# Patient Record
Sex: Male | Born: 1940 | Race: White | Hispanic: No | Marital: Married | Smoking: Former smoker
Health system: Southern US, Community
[De-identification: ages and names within clinical notes are randomized; demographics above are authoritative.]

## PROBLEM LIST (undated history)

## (undated) DIAGNOSIS — E039 Hypothyroidism, unspecified: Secondary | ICD-10-CM

## (undated) DIAGNOSIS — E785 Hyperlipidemia, unspecified: Secondary | ICD-10-CM

## (undated) DIAGNOSIS — D649 Anemia, unspecified: Secondary | ICD-10-CM

## (undated) DIAGNOSIS — H409 Unspecified glaucoma: Secondary | ICD-10-CM

## (undated) DIAGNOSIS — I2510 Atherosclerotic heart disease of native coronary artery without angina pectoris: Secondary | ICD-10-CM

## (undated) DIAGNOSIS — F1011 Alcohol abuse, in remission: Secondary | ICD-10-CM

## (undated) DIAGNOSIS — J449 Chronic obstructive pulmonary disease, unspecified: Secondary | ICD-10-CM

## (undated) DIAGNOSIS — Z8619 Personal history of other infectious and parasitic diseases: Secondary | ICD-10-CM

## (undated) DIAGNOSIS — I10 Essential (primary) hypertension: Secondary | ICD-10-CM

## (undated) HISTORY — PX: EYE SURGERY: SHX253

## (undated) HISTORY — PX: CORONARY ARTERY BYPASS GRAFT: SHX141

## (undated) HISTORY — PX: CORONARY ANGIOPLASTY: SHX604

## (undated) SURGERY — LAPAROSCOPIC CHOLECYSTECTOMY WITH INTRAOPERATIVE CHOLANGIOGRAM
Anesthesia: General

---

## 2004-07-24 ENCOUNTER — Ambulatory Visit: Payer: Self-pay | Admitting: Internal Medicine

## 2008-12-11 ENCOUNTER — Inpatient Hospital Stay: Payer: Self-pay | Admitting: Internal Medicine

## 2012-04-01 ENCOUNTER — Ambulatory Visit: Payer: Self-pay | Admitting: Ophthalmology

## 2012-04-22 DIAGNOSIS — D649 Anemia, unspecified: Secondary | ICD-10-CM | POA: Insufficient documentation

## 2012-04-29 ENCOUNTER — Ambulatory Visit: Payer: Self-pay | Admitting: Ophthalmology

## 2012-12-08 ENCOUNTER — Ambulatory Visit: Payer: Self-pay | Admitting: Internal Medicine

## 2013-01-02 ENCOUNTER — Ambulatory Visit: Payer: Self-pay | Admitting: Internal Medicine

## 2013-07-05 ENCOUNTER — Ambulatory Visit: Payer: Self-pay | Admitting: Internal Medicine

## 2013-07-05 LAB — CREATININE, SERUM
Creatinine: 1.29 mg/dL (ref 0.60–1.30)
EGFR (African American): >60
EGFR (Non-African Amer.): 55 — ABNORMAL LOW

## 2013-07-05 LAB — CBC CANCER CENTER
Basophil #: 0.1 x10 3/mm (ref 0.0–0.1)
Basophil %: 0.9 %
EOS ABS: 0.6 x10 3/mm (ref 0.0–0.7)
Eosinophil %: 6.5 %
HCT: 34.5 % — ABNORMAL LOW (ref 40.0–52.0)
HGB: 12 g/dL — ABNORMAL LOW (ref 13.0–18.0)
Lymphocyte #: 1.8 x10 3/mm (ref 1.0–3.6)
Lymphocyte %: 20.4 %
MCH: 31.1 pg (ref 26.0–34.0)
MCHC: 34.8 g/dL (ref 32.0–36.0)
MCV: 90 fL (ref 80–100)
MONOS PCT: 8.5 %
Monocyte #: 0.8 x10 3/mm (ref 0.2–1.0)
Neutrophil #: 5.7 x10 3/mm (ref 1.4–6.5)
Neutrophil %: 63.7 %
Platelet: 232 x10 3/mm (ref 150–440)
RBC: 3.85 10*6/uL — AB (ref 4.40–5.90)
RDW: 13.3 % (ref 11.5–14.5)
WBC: 8.9 x10 3/mm (ref 3.8–10.6)

## 2013-07-05 LAB — CALCIUM: Calcium, Total: 9.2 mg/dL (ref 8.5–10.1)

## 2013-07-06 LAB — PROT IMMUNOELECTROPHORES(ARMC)

## 2013-07-06 LAB — KAPPA/LAMBDA FREE LIGHT CHAINS (ARMC)

## 2013-08-02 ENCOUNTER — Ambulatory Visit: Payer: Self-pay | Admitting: Internal Medicine

## 2014-08-09 ENCOUNTER — Ambulatory Visit: Payer: Self-pay | Admitting: Internal Medicine

## 2014-08-09 ENCOUNTER — Other Ambulatory Visit: Payer: Self-pay

## 2014-08-22 ENCOUNTER — Encounter: Admission: RE | Payer: Self-pay | Source: Ambulatory Visit

## 2014-08-22 ENCOUNTER — Ambulatory Visit: Admission: RE | Admit: 2014-08-22 | Payer: PPO | Source: Ambulatory Visit | Admitting: Gastroenterology

## 2014-08-22 SURGERY — COLONOSCOPY WITH PROPOFOL
Anesthesia: General

## 2014-09-22 ENCOUNTER — Encounter: Payer: Self-pay | Admitting: *Deleted

## 2014-09-23 ENCOUNTER — Encounter: Payer: Self-pay | Admitting: Certified Registered Nurse Anesthetist

## 2014-09-23 ENCOUNTER — Encounter
Admission: RE | Disposition: A | Discharge status: Home / Self Care | Payer: Self-pay | Source: Ambulatory Visit | Attending: Gastroenterology

## 2014-09-23 ENCOUNTER — Ambulatory Visit
Admission: RE | Admit: 2014-09-23 | Discharge: 2014-09-23 | Disposition: A | Discharge status: Home / Self Care | Payer: PPO | Source: Ambulatory Visit | Attending: Gastroenterology | Admitting: Gastroenterology

## 2014-09-23 DIAGNOSIS — H409 Unspecified glaucoma: Secondary | ICD-10-CM | POA: Diagnosis not present

## 2014-09-23 DIAGNOSIS — Z9861 Coronary angioplasty status: Secondary | ICD-10-CM | POA: Diagnosis not present

## 2014-09-23 DIAGNOSIS — Z1211 Encounter for screening for malignant neoplasm of colon: Secondary | ICD-10-CM | POA: Diagnosis present

## 2014-09-23 DIAGNOSIS — I2510 Atherosclerotic heart disease of native coronary artery without angina pectoris: Secondary | ICD-10-CM | POA: Diagnosis not present

## 2014-09-23 DIAGNOSIS — E785 Hyperlipidemia, unspecified: Secondary | ICD-10-CM | POA: Insufficient documentation

## 2014-09-23 DIAGNOSIS — Z87891 Personal history of nicotine dependence: Secondary | ICD-10-CM | POA: Insufficient documentation

## 2014-09-23 DIAGNOSIS — K5730 Diverticulosis of large intestine without perforation or abscess without bleeding: Secondary | ICD-10-CM | POA: Diagnosis not present

## 2014-09-23 DIAGNOSIS — I10 Essential (primary) hypertension: Secondary | ICD-10-CM | POA: Diagnosis not present

## 2014-09-23 DIAGNOSIS — Z7982 Long term (current) use of aspirin: Secondary | ICD-10-CM | POA: Insufficient documentation

## 2014-09-23 DIAGNOSIS — Z79899 Other long term (current) drug therapy: Secondary | ICD-10-CM | POA: Diagnosis not present

## 2014-09-23 DIAGNOSIS — Z951 Presence of aortocoronary bypass graft: Secondary | ICD-10-CM | POA: Diagnosis not present

## 2014-09-23 HISTORY — DX: Hyperlipidemia, unspecified: E78.5

## 2014-09-23 HISTORY — DX: Alcohol abuse, in remission: F10.11

## 2014-09-23 HISTORY — PX: COLONOSCOPY WITH PROPOFOL: SHX5780

## 2014-09-23 HISTORY — DX: Unspecified glaucoma: H40.9

## 2014-09-23 HISTORY — DX: Anemia, unspecified: D64.9

## 2014-09-23 HISTORY — DX: Personal history of other infectious and parasitic diseases: Z86.19

## 2014-09-23 HISTORY — DX: Essential (primary) hypertension: I10

## 2014-09-23 HISTORY — DX: Atherosclerotic heart disease of native coronary artery without angina pectoris: I25.10

## 2014-09-23 SURGERY — COLONOSCOPY WITH PROPOFOL
Anesthesia: General

## 2014-09-23 MED ORDER — FENTANYL CITRATE (PF) 100 MCG/2ML IJ SOLN
INTRAMUSCULAR | Status: AC
  Filled 2014-09-23: qty 4

## 2014-09-23 MED ORDER — FENTANYL CITRATE (PF) 100 MCG/2ML IJ SOLN
INTRAMUSCULAR | Status: DC | PRN
  Administered 2014-09-23 (×2): 25 ug via INTRAVENOUS
  Administered 2014-09-23: 50 ug via INTRAVENOUS

## 2014-09-23 MED ORDER — MIDAZOLAM HCL 5 MG/5ML IJ SOLN
INTRAMUSCULAR | Status: DC | PRN
  Administered 2014-09-23: 2 mg via INTRAVENOUS
  Administered 2014-09-23 (×2): 1 mg via INTRAVENOUS

## 2014-09-23 MED ORDER — SODIUM CHLORIDE 0.9 % IV SOLN: INTRAVENOUS | Status: DC

## 2014-09-23 MED ORDER — MIDAZOLAM HCL 5 MG/5ML IJ SOLN
INTRAMUSCULAR | Status: AC
  Filled 2014-09-23: qty 10

## 2014-09-23 MED ORDER — SODIUM CHLORIDE 0.9 % IV SOLN
INTRAVENOUS | Status: DC
  Administered 2014-09-23: 1000 mL via INTRAVENOUS

## 2014-09-23 NOTE — H&P (Signed)
Primary Care Physician:  Idelle Crouch, MD  Pre-Procedure History & Physical: HPI:  Jason House is a 74 y.o. male is here for an colonoscopy.   Past Medical History  Diagnosis Date  . Hyperlipidemia   . Hypertension   . Coronary artery disease   . Glaucoma   . H/O ETOH abuse   . History of shingles   . Anemia     Past Surgical History  Procedure Laterality Date  . Coronary artery bypass graft    . Eye surgery    . Coronary angioplasty      Prior to Admission medications   Medication Sig Start Date End Date Taking? Authorizing Provider  amLODipine (NORVASC) 10 MG tablet Take 10 mg by mouth daily.   Yes Historical Provider, MD  aspirin EC 81 MG tablet Take 81 mg by mouth daily.   Yes Historical Provider, MD  atorvastatin (LIPITOR) 80 MG tablet Take 80 mg by mouth daily.   Yes Historical Provider, MD  clopidogrel (PLAVIX) 75 MG tablet Take 75 mg by mouth daily.   Yes Historical Provider, MD  colchicine 0.6 MG tablet Take 0.6 mg by mouth as directed. Take 2 tablets by mouth at first sign of gout, followed by one tablet in one hour   Yes Historical Provider, MD  dorzolamide-timolol (COSOPT) 22.3-6.8 MG/ML ophthalmic solution Place 1 drop into both eyes 2 (two) times daily.   Yes Historical Provider, MD  hydrochlorothiazide (MICROZIDE) 12.5 MG capsule Take 12.5 mg by mouth daily.   Yes Historical Provider, MD  latanoprost (XALATAN) 0.005 % ophthalmic solution Place 1 drop into both eyes at bedtime.   Yes Historical Provider, MD  lisinopril (PRINIVIL,ZESTRIL) 40 MG tablet Take 40 mg by mouth daily.   Yes Historical Provider, MD  metoprolol succinate (TOPROL-XL) 25 MG 24 hr tablet Take 25 mg by mouth daily.   Yes Historical Provider, MD  vitamin B-12 (CYANOCOBALAMIN) 1000 MCG tablet Take 1,000 mcg by mouth daily.   Yes Historical Provider, MD    Allergies as of 08/17/2014  . (Not on File)    History reviewed. No pertinent family history.  History   Social History  .  Marital Status: Married    Spouse Name: N/A  . Number of Children: N/A  . Years of Education: N/A   Occupational History  . Not on file.   Social History Main Topics  . Smoking status: Former Research scientist (life sciences)  . Smokeless tobacco: Not on file  . Alcohol Use: Not on file  . Drug Use: Not on file  . Sexual Activity: Not on file   Other Topics Concern  . Not on file   Social History Narrative  . No narrative on file     Physical Exam: BP 114/48 mmHg  Pulse 47  Temp(Src) 97.1 F (36.2 C) (Tympanic)  Resp 16  Ht 5\' 4"  (1.626 m)  Wt 75.751 kg (167 lb)  BMI 28.65 kg/m2  SpO2 100% General:   Alert,  pleasant and cooperative in NAD Head:  Normocephalic and atraumatic. Neck:  Supple; no masses or thyromegaly. Lungs:  Clear throughout to auscultation.    Heart:  Regular rate and rhythm. Abdomen:  Soft, nontender and nondistended. Normal bowel sounds, without guarding, and without rebound.   Neurologic:  Alert and  oriented x4;  grossly normal neurologically.  Impression/Plan: Jason House is here for an colonoscopy to be performed for screening  Risks, benefits, limitations, and alternatives regarding  colonoscopy have been reviewed with the patient.  Questions have been answered.  All parties agreeable.   Josefine Class, MD  09/23/2014, 8:48 AM

## 2014-09-23 NOTE — Discharge Instructions (Signed)

## 2014-09-23 NOTE — Op Note (Signed)
[facility] Gastroenterology Patient Name: Jason House Procedure Date: 09/23/2014 8:56 AM MRN: [id number] Account #: [id number] Date of Birth: 05/30/1940 Admit Type: Outpatient Age: 74 Room: [facility] ENDO ROOM 3 Gender: Male Note Status: Finalized Procedure:         Colonoscopy Indications:       Screening for colorectal malignant neoplasm, Last                     colonoscopy 10 years ago Patient Profile:   This is a 74 year old male. Providers:         Gerrit Heck. Rayann Heman, MD Referring MD:      Leonie Douglas. Sparks, MD (Referring MD) Medicines:         Fentanyl 100 micrograms IV, Midazolam 4 mg IV Complications:     No immediate complications. Procedure:         Pre-Anesthesia Assessment:                    - Prior to the procedure, a History and Physical was                     performed, and patient medications, allergies and                     sensitivities were reviewed. The patient's tolerance of                     previous anesthesia was reviewed.                    After obtaining informed consent, the colonoscope was                     passed under direct vision. Throughout the procedure, the                     patient's blood pressure, pulse, and oxygen saturations                     were monitored continuously. The Colonoscope was                     introduced through the anus and advanced to the the cecum,                     identified by appendiceal orifice and ileocecal valve. The                     colonoscopy was somewhat difficult due to a tortuous                     colon. Successful completion of the procedure was aided by                     using manual pressure. The patient tolerated the procedure                     well. The quality of the bowel preparation was excellent. Findings:      The perianal and digital rectal examinations were normal.      A few small and large-mouthed diverticula were found in the sigmoid       colon, in the  transverse colon, at the hepatic flexure and in the       ascending colon.  The exam was otherwise without abnormality on direct and retroflexion       views. Impression:        - Diverticulosis in the sigmoid colon, in the transverse                     colon, at the hepatic flexure and in the ascending colon.                    - The examination was otherwise normal on direct and                     retroflexion views.                    - No specimens collected. Recommendation:    - Observe patient in GI recovery unit.                    - High fiber diet.                    - Continue present medications.                    - Resume Plavix (clopidogrel) at prior dose today.                    - Would not perform further colonoscpies for screening                     given age.                    - Follow Hgb and iron studies as outpt, will need EGD if                     hgb falls further and iron studies become consistent with                     IDA                    - The findings and recommendations were discussed with the                     patient.                    - The findings and recommendations were discussed with the                     patient's family.                    - Return to referring physician. Procedure Code(s): --- Professional ---                    [phone], Colonoscopy, flexible; diagnostic, including                     collection of specimen(s) by brushing or washing, when                     performed (separate procedure) CPT copyright 2014 American Medical Association. All rights reserved. The codes documented in this report are preliminary and upon coder review may  be revised to meet current compliance requirements. Mellody Life, MD 09/23/2014 9:29:15 AM This report has been signed electronically. Number of Addenda:  0 Note Initiated On: 09/23/2014 8:56 AM Scope Withdrawal Time: 0 hours 11 minutes 36 seconds  Total Procedure Duration: 0  hours 18 minutes 22 seconds       [facility]

## 2014-09-26 ENCOUNTER — Encounter: Payer: Self-pay | Admitting: Gastroenterology

## 2014-12-06 ENCOUNTER — Other Ambulatory Visit: Payer: Self-pay | Admitting: Internal Medicine

## 2014-12-06 DIAGNOSIS — H539 Unspecified visual disturbance: Secondary | ICD-10-CM

## 2014-12-06 DIAGNOSIS — G44319 Acute post-traumatic headache, not intractable: Secondary | ICD-10-CM

## 2014-12-12 ENCOUNTER — Ambulatory Visit
Admission: RE | Admit: 2014-12-12 | Discharge: 2014-12-12 | Disposition: A | Discharge status: Home / Self Care | Payer: PPO | Source: Ambulatory Visit | Attending: Internal Medicine | Admitting: Internal Medicine

## 2014-12-12 DIAGNOSIS — H539 Unspecified visual disturbance: Secondary | ICD-10-CM | POA: Insufficient documentation

## 2014-12-12 DIAGNOSIS — G44319 Acute post-traumatic headache, not intractable: Secondary | ICD-10-CM | POA: Diagnosis present

## 2015-03-15 DIAGNOSIS — H401121 Primary open-angle glaucoma, left eye, mild stage: Secondary | ICD-10-CM | POA: Diagnosis not present

## 2015-03-20 DIAGNOSIS — H401121 Primary open-angle glaucoma, left eye, mild stage: Secondary | ICD-10-CM | POA: Diagnosis not present

## 2015-06-01 DIAGNOSIS — D649 Anemia, unspecified: Secondary | ICD-10-CM | POA: Diagnosis not present

## 2015-06-01 DIAGNOSIS — Z79899 Other long term (current) drug therapy: Secondary | ICD-10-CM | POA: Diagnosis not present

## 2015-06-01 DIAGNOSIS — E782 Mixed hyperlipidemia: Secondary | ICD-10-CM | POA: Diagnosis not present

## 2015-06-01 DIAGNOSIS — Z125 Encounter for screening for malignant neoplasm of prostate: Secondary | ICD-10-CM | POA: Diagnosis not present

## 2015-06-01 DIAGNOSIS — H538 Other visual disturbances: Secondary | ICD-10-CM | POA: Diagnosis not present

## 2015-06-01 DIAGNOSIS — Z1329 Encounter for screening for other suspected endocrine disorder: Secondary | ICD-10-CM | POA: Diagnosis not present

## 2015-06-01 DIAGNOSIS — I10 Essential (primary) hypertension: Secondary | ICD-10-CM | POA: Diagnosis not present

## 2015-06-01 DIAGNOSIS — E538 Deficiency of other specified B group vitamins: Secondary | ICD-10-CM | POA: Diagnosis not present

## 2015-06-01 DIAGNOSIS — M62830 Muscle spasm of back: Secondary | ICD-10-CM | POA: Diagnosis not present

## 2015-06-01 DIAGNOSIS — Z0000 Encounter for general adult medical examination without abnormal findings: Secondary | ICD-10-CM | POA: Diagnosis not present

## 2015-06-01 DIAGNOSIS — I739 Peripheral vascular disease, unspecified: Secondary | ICD-10-CM | POA: Diagnosis not present

## 2015-06-01 DIAGNOSIS — R0600 Dyspnea, unspecified: Secondary | ICD-10-CM | POA: Diagnosis not present

## 2015-06-01 DIAGNOSIS — R0609 Other forms of dyspnea: Secondary | ICD-10-CM | POA: Diagnosis not present

## 2015-07-20 DIAGNOSIS — I340 Nonrheumatic mitral (valve) insufficiency: Secondary | ICD-10-CM | POA: Diagnosis not present

## 2015-07-20 DIAGNOSIS — I7389 Other specified peripheral vascular diseases: Secondary | ICD-10-CM | POA: Diagnosis not present

## 2015-07-20 DIAGNOSIS — E782 Mixed hyperlipidemia: Secondary | ICD-10-CM | POA: Diagnosis not present

## 2015-07-20 DIAGNOSIS — I2510 Atherosclerotic heart disease of native coronary artery without angina pectoris: Secondary | ICD-10-CM | POA: Diagnosis not present

## 2015-07-20 DIAGNOSIS — I10 Essential (primary) hypertension: Secondary | ICD-10-CM | POA: Diagnosis not present

## 2015-07-20 DIAGNOSIS — Z951 Presence of aortocoronary bypass graft: Secondary | ICD-10-CM | POA: Diagnosis not present

## 2015-09-18 DIAGNOSIS — E039 Hypothyroidism, unspecified: Secondary | ICD-10-CM | POA: Diagnosis not present

## 2015-09-18 DIAGNOSIS — H401121 Primary open-angle glaucoma, left eye, mild stage: Secondary | ICD-10-CM | POA: Diagnosis not present

## 2015-12-06 DIAGNOSIS — R7309 Other abnormal glucose: Secondary | ICD-10-CM | POA: Diagnosis not present

## 2015-12-06 DIAGNOSIS — I2510 Atherosclerotic heart disease of native coronary artery without angina pectoris: Secondary | ICD-10-CM | POA: Diagnosis not present

## 2015-12-06 DIAGNOSIS — I10 Essential (primary) hypertension: Secondary | ICD-10-CM | POA: Diagnosis not present

## 2015-12-06 DIAGNOSIS — E039 Hypothyroidism, unspecified: Secondary | ICD-10-CM | POA: Diagnosis not present

## 2015-12-06 DIAGNOSIS — Z79899 Other long term (current) drug therapy: Secondary | ICD-10-CM | POA: Diagnosis not present

## 2015-12-06 DIAGNOSIS — E782 Mixed hyperlipidemia: Secondary | ICD-10-CM | POA: Diagnosis not present

## 2015-12-06 DIAGNOSIS — R0602 Shortness of breath: Secondary | ICD-10-CM | POA: Diagnosis not present

## 2015-12-06 DIAGNOSIS — J069 Acute upper respiratory infection, unspecified: Secondary | ICD-10-CM | POA: Diagnosis not present

## 2015-12-06 DIAGNOSIS — Z23 Encounter for immunization: Secondary | ICD-10-CM | POA: Diagnosis not present

## 2015-12-21 DIAGNOSIS — R7309 Other abnormal glucose: Secondary | ICD-10-CM | POA: Diagnosis not present

## 2015-12-21 DIAGNOSIS — I2510 Atherosclerotic heart disease of native coronary artery without angina pectoris: Secondary | ICD-10-CM | POA: Diagnosis not present

## 2015-12-21 DIAGNOSIS — I7389 Other specified peripheral vascular diseases: Secondary | ICD-10-CM | POA: Diagnosis not present

## 2015-12-21 DIAGNOSIS — E782 Mixed hyperlipidemia: Secondary | ICD-10-CM | POA: Diagnosis not present

## 2015-12-21 DIAGNOSIS — E039 Hypothyroidism, unspecified: Secondary | ICD-10-CM | POA: Diagnosis not present

## 2015-12-21 DIAGNOSIS — Z79899 Other long term (current) drug therapy: Secondary | ICD-10-CM | POA: Diagnosis not present

## 2015-12-21 DIAGNOSIS — I10 Essential (primary) hypertension: Secondary | ICD-10-CM | POA: Diagnosis not present

## 2016-03-18 DIAGNOSIS — H401121 Primary open-angle glaucoma, left eye, mild stage: Secondary | ICD-10-CM | POA: Diagnosis not present

## 2016-03-25 DIAGNOSIS — H401121 Primary open-angle glaucoma, left eye, mild stage: Secondary | ICD-10-CM | POA: Diagnosis not present

## 2016-04-23 DIAGNOSIS — I10 Essential (primary) hypertension: Secondary | ICD-10-CM | POA: Diagnosis not present

## 2016-04-23 DIAGNOSIS — I25810 Atherosclerosis of coronary artery bypass graft(s) without angina pectoris: Secondary | ICD-10-CM | POA: Diagnosis not present

## 2016-04-23 DIAGNOSIS — E782 Mixed hyperlipidemia: Secondary | ICD-10-CM | POA: Diagnosis not present

## 2016-04-23 DIAGNOSIS — I2510 Atherosclerotic heart disease of native coronary artery without angina pectoris: Secondary | ICD-10-CM | POA: Diagnosis not present

## 2016-06-13 ENCOUNTER — Other Ambulatory Visit: Payer: Self-pay | Admitting: Internal Medicine

## 2016-06-13 DIAGNOSIS — R7309 Other abnormal glucose: Secondary | ICD-10-CM | POA: Diagnosis not present

## 2016-06-13 DIAGNOSIS — E782 Mixed hyperlipidemia: Secondary | ICD-10-CM | POA: Diagnosis not present

## 2016-06-13 DIAGNOSIS — Z0000 Encounter for general adult medical examination without abnormal findings: Secondary | ICD-10-CM | POA: Diagnosis not present

## 2016-06-13 DIAGNOSIS — R0602 Shortness of breath: Secondary | ICD-10-CM | POA: Diagnosis not present

## 2016-06-13 DIAGNOSIS — Z125 Encounter for screening for malignant neoplasm of prostate: Secondary | ICD-10-CM | POA: Diagnosis not present

## 2016-06-13 DIAGNOSIS — Z79899 Other long term (current) drug therapy: Secondary | ICD-10-CM | POA: Diagnosis not present

## 2016-06-13 DIAGNOSIS — E039 Hypothyroidism, unspecified: Secondary | ICD-10-CM | POA: Diagnosis not present

## 2016-06-13 DIAGNOSIS — I10 Essential (primary) hypertension: Secondary | ICD-10-CM | POA: Diagnosis not present

## 2016-06-13 DIAGNOSIS — I739 Peripheral vascular disease, unspecified: Secondary | ICD-10-CM | POA: Diagnosis not present

## 2016-06-13 DIAGNOSIS — R634 Abnormal weight loss: Secondary | ICD-10-CM

## 2016-06-13 DIAGNOSIS — I2510 Atherosclerotic heart disease of native coronary artery without angina pectoris: Secondary | ICD-10-CM | POA: Diagnosis not present

## 2016-06-20 DIAGNOSIS — N179 Acute kidney failure, unspecified: Secondary | ICD-10-CM | POA: Diagnosis not present

## 2016-06-28 ENCOUNTER — Ambulatory Visit
Admission: RE | Admit: 2016-06-28 | Discharge: 2016-06-28 | Disposition: A | Discharge status: Home / Self Care | Payer: PPO | Source: Ambulatory Visit | Attending: Internal Medicine | Admitting: Internal Medicine

## 2016-06-28 DIAGNOSIS — R634 Abnormal weight loss: Secondary | ICD-10-CM | POA: Insufficient documentation

## 2016-06-28 DIAGNOSIS — I700 Atherosclerosis of aorta: Secondary | ICD-10-CM | POA: Insufficient documentation

## 2016-06-28 DIAGNOSIS — N400 Enlarged prostate without lower urinary tract symptoms: Secondary | ICD-10-CM | POA: Insufficient documentation

## 2016-06-28 MED ORDER — IOPAMIDOL (ISOVUE-300) INJECTION 61%
85.0000 mL | Freq: Once | INTRAVENOUS | Status: AC | PRN
  Administered 2016-06-28: 85 mL via INTRAVENOUS

## 2016-07-22 DIAGNOSIS — R600 Localized edema: Secondary | ICD-10-CM | POA: Diagnosis not present

## 2016-07-22 DIAGNOSIS — I10 Essential (primary) hypertension: Secondary | ICD-10-CM | POA: Diagnosis not present

## 2016-09-13 DIAGNOSIS — Z79899 Other long term (current) drug therapy: Secondary | ICD-10-CM | POA: Diagnosis not present

## 2016-09-13 DIAGNOSIS — I739 Peripheral vascular disease, unspecified: Secondary | ICD-10-CM | POA: Diagnosis not present

## 2016-09-13 DIAGNOSIS — M542 Cervicalgia: Secondary | ICD-10-CM | POA: Diagnosis not present

## 2016-09-13 DIAGNOSIS — E782 Mixed hyperlipidemia: Secondary | ICD-10-CM | POA: Diagnosis not present

## 2016-09-13 DIAGNOSIS — I10 Essential (primary) hypertension: Secondary | ICD-10-CM | POA: Diagnosis not present

## 2016-09-13 DIAGNOSIS — J431 Panlobular emphysema: Secondary | ICD-10-CM | POA: Diagnosis not present

## 2016-09-13 DIAGNOSIS — M50323 Other cervical disc degeneration at C6-C7 level: Secondary | ICD-10-CM | POA: Diagnosis not present

## 2016-09-23 DIAGNOSIS — H401121 Primary open-angle glaucoma, left eye, mild stage: Secondary | ICD-10-CM | POA: Diagnosis not present

## 2016-09-24 DIAGNOSIS — I2510 Atherosclerotic heart disease of native coronary artery without angina pectoris: Secondary | ICD-10-CM | POA: Diagnosis not present

## 2016-09-24 DIAGNOSIS — I25810 Atherosclerosis of coronary artery bypass graft(s) without angina pectoris: Secondary | ICD-10-CM | POA: Diagnosis not present

## 2016-09-24 DIAGNOSIS — I10 Essential (primary) hypertension: Secondary | ICD-10-CM | POA: Diagnosis not present

## 2016-09-24 DIAGNOSIS — E782 Mixed hyperlipidemia: Secondary | ICD-10-CM | POA: Diagnosis not present

## 2016-10-01 DIAGNOSIS — R079 Chest pain, unspecified: Secondary | ICD-10-CM | POA: Diagnosis not present

## 2016-10-04 DIAGNOSIS — I340 Nonrheumatic mitral (valve) insufficiency: Secondary | ICD-10-CM | POA: Diagnosis not present

## 2016-10-04 DIAGNOSIS — E782 Mixed hyperlipidemia: Secondary | ICD-10-CM | POA: Diagnosis not present

## 2016-10-04 DIAGNOSIS — I7389 Other specified peripheral vascular diseases: Secondary | ICD-10-CM | POA: Diagnosis not present

## 2016-10-04 DIAGNOSIS — R0602 Shortness of breath: Secondary | ICD-10-CM | POA: Diagnosis not present

## 2016-10-04 DIAGNOSIS — I2510 Atherosclerotic heart disease of native coronary artery without angina pectoris: Secondary | ICD-10-CM | POA: Diagnosis not present

## 2016-10-04 DIAGNOSIS — I10 Essential (primary) hypertension: Secondary | ICD-10-CM | POA: Diagnosis not present

## 2016-10-04 DIAGNOSIS — I25810 Atherosclerosis of coronary artery bypass graft(s) without angina pectoris: Secondary | ICD-10-CM | POA: Diagnosis not present

## 2016-12-18 DIAGNOSIS — I10 Essential (primary) hypertension: Secondary | ICD-10-CM | POA: Diagnosis not present

## 2016-12-18 DIAGNOSIS — E782 Mixed hyperlipidemia: Secondary | ICD-10-CM | POA: Diagnosis not present

## 2016-12-18 DIAGNOSIS — I2510 Atherosclerotic heart disease of native coronary artery without angina pectoris: Secondary | ICD-10-CM | POA: Diagnosis not present

## 2016-12-18 DIAGNOSIS — Z79899 Other long term (current) drug therapy: Secondary | ICD-10-CM | POA: Diagnosis not present

## 2016-12-18 DIAGNOSIS — R0989 Other specified symptoms and signs involving the circulatory and respiratory systems: Secondary | ICD-10-CM | POA: Diagnosis not present

## 2016-12-18 DIAGNOSIS — Z23 Encounter for immunization: Secondary | ICD-10-CM | POA: Diagnosis not present

## 2016-12-18 DIAGNOSIS — R7309 Other abnormal glucose: Secondary | ICD-10-CM | POA: Diagnosis not present

## 2016-12-18 DIAGNOSIS — I739 Peripheral vascular disease, unspecified: Secondary | ICD-10-CM | POA: Diagnosis not present

## 2017-01-03 DIAGNOSIS — R0989 Other specified symptoms and signs involving the circulatory and respiratory systems: Secondary | ICD-10-CM | POA: Diagnosis not present

## 2017-01-03 DIAGNOSIS — I6523 Occlusion and stenosis of bilateral carotid arteries: Secondary | ICD-10-CM | POA: Diagnosis not present

## 2017-01-28 ENCOUNTER — Encounter (INDEPENDENT_AMBULATORY_CARE_PROVIDER_SITE_OTHER): Payer: Self-pay | Admitting: Vascular Surgery

## 2017-01-28 ENCOUNTER — Ambulatory Visit (INDEPENDENT_AMBULATORY_CARE_PROVIDER_SITE_OTHER): Payer: PPO | Admitting: Vascular Surgery

## 2017-01-28 ENCOUNTER — Encounter (INDEPENDENT_AMBULATORY_CARE_PROVIDER_SITE_OTHER): Payer: Self-pay

## 2017-01-28 VITALS — BP 127/51 | HR 55 | Resp 17 | Ht 66.0 in | Wt 160.0 lb

## 2017-01-28 DIAGNOSIS — E785 Hyperlipidemia, unspecified: Secondary | ICD-10-CM | POA: Diagnosis not present

## 2017-01-28 DIAGNOSIS — I6523 Occlusion and stenosis of bilateral carotid arteries: Secondary | ICD-10-CM | POA: Diagnosis not present

## 2017-01-28 DIAGNOSIS — I10 Essential (primary) hypertension: Secondary | ICD-10-CM

## 2017-01-28 NOTE — Progress Notes (Signed)
Subjective:    Patient ID: Jason House, male    DOB: [birth year], 76 y.o.   MRN: [id number] Chief Complaint  Patient presents with  . New Patient (Initial Visit)    CAS PT LS 2015   Presents as a new patient referred by Dr. Doy Hutching for evaluation of "carotid stenosis".  Patient endorses history of neck pain and headaches which prompted a duplex of his carotid arteries.  The carotid duplex was performed at [facility] radiology on January 03, 2017.  Carotid duplex was notable for greater than 70% luminal narrowing to the right internal carotid artery.  50-69% narrowing of the left internal carotid artery.  Antegrade vertebral arteries. The patient denies experiencing Amaurosis Fugax, TIA like symptoms or focal motor deficits.  Patient denies any fever, nausea vomiting.   Review of Systems  Constitutional: Negative.   HENT: Negative.   Eyes: Negative.   Respiratory: Negative.   Cardiovascular: Negative.   Gastrointestinal: Negative.   Endocrine: Negative.   Genitourinary: Negative.   Musculoskeletal: Negative.   Skin: Negative.   Allergic/Immunologic: Negative.   Neurological: Negative.   Hematological: Negative.   Psychiatric/Behavioral: Negative.       Objective:   Physical Exam  Constitutional: He is oriented to person, place, and time. He appears well-developed and well-nourished. No distress.  HENT:  Head: Normocephalic and atraumatic.  Eyes: Conjunctivae are normal. Pupils are equal, round, and reactive to light.  Neck: Normal range of motion.  Bilateral carotid bruits noted on exam.  Right greater than left.  Cardiovascular: Normal rate, regular rhythm, normal heart sounds and intact distal pulses.  Pulses:      Radial pulses are 2+ on the right side, and 2+ on the left side.  Pulmonary/Chest: Effort normal and breath sounds normal.  Musculoskeletal: Normal range of motion. He exhibits no edema.  Neurological: He is alert and oriented to person, place, and time.    Skin: Skin is warm and dry. He is not diaphoretic.  Psychiatric: He has a normal mood and affect. His behavior is normal. Judgment and thought content normal.  Vitals reviewed.  BP (!) 127/51   Pulse (!) 55   Resp 17   Ht 5\' 6"  (1.676 m)   Wt 160 lb (72.6 kg)   BMI 25.82 kg/m   Past Medical History:  Diagnosis Date  . Anemia   . Coronary artery disease   . Glaucoma   . H/O ETOH abuse   . History of shingles   . Hyperlipidemia   . Hypertension    Social History   Socioeconomic History  . Marital status: Married    Spouse name: Not on file  . Number of children: Not on file  . Years of education: Not on file  . Highest education level: Not on file  Social Needs  . Financial resource strain: Not on file  . Food insecurity - worry: Not on file  . Food insecurity - inability: Not on file  . Transportation needs - medical: Not on file  . Transportation needs - non-medical: Not on file  Occupational History  . Not on file  Tobacco Use  . Smoking status: Former Research scientist (life sciences)  . Smokeless tobacco: Never Used  Substance and Sexual Activity  . Alcohol use: Not on file  . Drug use: Not on file  . Sexual activity: Not on file  Other Topics Concern  . Not on file  Social History Narrative  . Not on file   Past Surgical History:  Procedure Laterality Date  . COLONOSCOPY WITH PROPOFOL N/A 09/23/2014   Procedure: COLONOSCOPY WITH PROPOFOL;  Surgeon: Josefine Class, MD;  Location: [facility] ENDOSCOPY;  Service: Endoscopy;  Laterality: N/A;  . CORONARY ANGIOPLASTY    . CORONARY ARTERY BYPASS GRAFT    . EYE SURGERY     No family history on file.  No Known Allergies     Assessment & Plan:  Presents as a new patient referred by Dr. Doy Hutching for evaluation of "carotid stenosis".  Patient endorses history of neck pain and headaches which prompted a duplex of his carotid arteries.  The carotid duplex was performed at [facility] radiology on January 03, 2017.  Carotid duplex was notable  for greater than 70% luminal narrowing to the right internal carotid artery.  50-69% narrowing of the left internal carotid artery.  Antegrade vertebral arteries. The patient denies experiencing Amaurosis Fugax, TIA like symptoms or focal motor deficits.  Patient denies any fever, nausea vomiting.  1. Bilateral carotid artery stenosis - New Patient with bilateral carotid artery stenosis found on duplex. Patient is asymptomatic at this time. Physical exam with bilateral carotid bruits noted Recommend follow-up in 6 months with a repeat carotid duplex. I have discussed with the patient at length the risk factors for and pathogenesis of atherosclerotic disease and encouraged a healthy diet, regular exercise regimen and blood pressure / glucose control.  Patient was instructed to contact our office in the interim with problems such as arm / leg weakness or numbness, speech / swallowing difficulty or temporary monocular blindness. The patient expresses their understanding.   - VAS US CAROTID; Future  2. Hyperlipidemia, unspecified hyperlipidemia type - Stable Encouraged good control as its slows the progression of atherosclerotic disease  3. Essential hypertension - Stable Encouraged good control as its slows the progression of atherosclerotic disease  Current Outpatient Medications on File Prior to Visit  Medication Sig Dispense Refill  . amLODipine (NORVASC) 10 MG tablet Take 10 mg by mouth daily.    Marland Kitchen aspirin EC 81 MG tablet Take 81 mg by mouth daily.    Marland Kitchen atorvastatin (LIPITOR) 80 MG tablet Take 80 mg by mouth daily.    . clopidogrel (PLAVIX) 75 MG tablet Take 75 mg by mouth daily.    . cyclobenzaprine (FLEXERIL) 10 MG tablet TAKE 1 TABLET BY MOUTH NIGHTLY    . dorzolamide-timolol (COSOPT) 22.3-6.8 MG/ML ophthalmic solution Place 1 drop into both eyes 2 (two) times daily.    . hydrochlorothiazide (MICROZIDE) 12.5 MG capsule Take 25 mg by mouth daily.     Marland Kitchen latanoprost (XALATAN) 0.005 %  ophthalmic solution Place 1 drop into both eyes at bedtime.    Marland Kitchen lisinopril (PRINIVIL,ZESTRIL) 40 MG tablet Take 40 mg by mouth daily.    . metoprolol succinate (TOPROL-XL) 25 MG 24 hr tablet Take 25 mg by mouth daily.    . metoprolol tartrate (LOPRESSOR) 25 MG tablet     . vitamin B-12 (CYANOCOBALAMIN) 1000 MCG tablet Take 1,000 mcg by mouth daily.    . budesonide-formoterol (SYMBICORT) 160-4.5 MCG/ACT inhaler Inhale into the lungs.    . colchicine 0.6 MG tablet Take 0.6 mg by mouth as directed. Take 2 tablets by mouth at first sign of gout, followed by one tablet in one hour    . nitroGLYCERIN (NITROSTAT) 0.4 MG SL tablet Place under the tongue.    . vitamin B-12 (CYANOCOBALAMIN) 1000 MCG tablet Take by mouth.     No current facility-administered medications on file prior to  visit.    There are no Patient Instructions on file for this visit. No Follow-up on file.  KIMBERLY A STEGMAYER, PA-C

## 2017-01-31 DIAGNOSIS — I10 Essential (primary) hypertension: Secondary | ICD-10-CM | POA: Diagnosis not present

## 2017-01-31 DIAGNOSIS — I25810 Atherosclerosis of coronary artery bypass graft(s) without angina pectoris: Secondary | ICD-10-CM | POA: Diagnosis not present

## 2017-01-31 DIAGNOSIS — R0602 Shortness of breath: Secondary | ICD-10-CM | POA: Diagnosis not present

## 2017-01-31 DIAGNOSIS — I340 Nonrheumatic mitral (valve) insufficiency: Secondary | ICD-10-CM | POA: Diagnosis not present

## 2017-01-31 DIAGNOSIS — E782 Mixed hyperlipidemia: Secondary | ICD-10-CM | POA: Diagnosis not present

## 2017-01-31 DIAGNOSIS — R079 Chest pain, unspecified: Secondary | ICD-10-CM | POA: Diagnosis not present

## 2017-01-31 DIAGNOSIS — I2510 Atherosclerotic heart disease of native coronary artery without angina pectoris: Secondary | ICD-10-CM | POA: Diagnosis not present

## 2017-01-31 DIAGNOSIS — I7389 Other specified peripheral vascular diseases: Secondary | ICD-10-CM | POA: Diagnosis not present

## 2017-02-03 DIAGNOSIS — R072 Precordial pain: Secondary | ICD-10-CM | POA: Diagnosis not present

## 2017-02-05 ENCOUNTER — Encounter (INDEPENDENT_AMBULATORY_CARE_PROVIDER_SITE_OTHER): Payer: Self-pay

## 2017-02-07 DIAGNOSIS — R079 Chest pain, unspecified: Secondary | ICD-10-CM | POA: Diagnosis not present

## 2017-02-07 DIAGNOSIS — E782 Mixed hyperlipidemia: Secondary | ICD-10-CM | POA: Diagnosis not present

## 2017-02-07 DIAGNOSIS — I7389 Other specified peripheral vascular diseases: Secondary | ICD-10-CM | POA: Diagnosis not present

## 2017-02-07 DIAGNOSIS — I340 Nonrheumatic mitral (valve) insufficiency: Secondary | ICD-10-CM | POA: Diagnosis not present

## 2017-02-07 DIAGNOSIS — I25810 Atherosclerosis of coronary artery bypass graft(s) without angina pectoris: Secondary | ICD-10-CM | POA: Diagnosis not present

## 2017-02-07 DIAGNOSIS — I10 Essential (primary) hypertension: Secondary | ICD-10-CM | POA: Diagnosis not present

## 2017-02-07 DIAGNOSIS — I2510 Atherosclerotic heart disease of native coronary artery without angina pectoris: Secondary | ICD-10-CM | POA: Diagnosis not present

## 2017-02-07 DIAGNOSIS — R0602 Shortness of breath: Secondary | ICD-10-CM | POA: Diagnosis not present

## 2017-03-17 DIAGNOSIS — H401121 Primary open-angle glaucoma, left eye, mild stage: Secondary | ICD-10-CM | POA: Diagnosis not present

## 2017-03-19 DIAGNOSIS — D649 Anemia, unspecified: Secondary | ICD-10-CM | POA: Diagnosis not present

## 2017-03-19 DIAGNOSIS — I739 Peripheral vascular disease, unspecified: Secondary | ICD-10-CM | POA: Diagnosis not present

## 2017-03-19 DIAGNOSIS — R7309 Other abnormal glucose: Secondary | ICD-10-CM | POA: Diagnosis not present

## 2017-03-19 DIAGNOSIS — Z125 Encounter for screening for malignant neoplasm of prostate: Secondary | ICD-10-CM | POA: Diagnosis not present

## 2017-03-19 DIAGNOSIS — I2510 Atherosclerotic heart disease of native coronary artery without angina pectoris: Secondary | ICD-10-CM | POA: Diagnosis not present

## 2017-03-19 DIAGNOSIS — Z79899 Other long term (current) drug therapy: Secondary | ICD-10-CM | POA: Diagnosis not present

## 2017-03-19 DIAGNOSIS — E782 Mixed hyperlipidemia: Secondary | ICD-10-CM | POA: Diagnosis not present

## 2017-03-19 DIAGNOSIS — I10 Essential (primary) hypertension: Secondary | ICD-10-CM | POA: Diagnosis not present

## 2017-03-24 DIAGNOSIS — H401121 Primary open-angle glaucoma, left eye, mild stage: Secondary | ICD-10-CM | POA: Diagnosis not present

## 2017-03-25 ENCOUNTER — Emergency Department: Payer: PPO

## 2017-03-25 ENCOUNTER — Encounter: Payer: Self-pay | Admitting: *Deleted

## 2017-03-25 ENCOUNTER — Other Ambulatory Visit: Payer: Self-pay

## 2017-03-25 ENCOUNTER — Emergency Department
Admission: EM | Admit: 2017-03-25 | Discharge: 2017-03-25 | Disposition: A | Discharge status: Home / Self Care | Payer: PPO | Source: Home / Self Care | Attending: Emergency Medicine | Admitting: Emergency Medicine

## 2017-03-25 DIAGNOSIS — K851 Biliary acute pancreatitis without necrosis or infection: Secondary | ICD-10-CM | POA: Diagnosis present

## 2017-03-25 DIAGNOSIS — I2510 Atherosclerotic heart disease of native coronary artery without angina pectoris: Secondary | ICD-10-CM | POA: Diagnosis present

## 2017-03-25 DIAGNOSIS — Z87891 Personal history of nicotine dependence: Secondary | ICD-10-CM | POA: Diagnosis not present

## 2017-03-25 DIAGNOSIS — Z7982 Long term (current) use of aspirin: Secondary | ICD-10-CM | POA: Diagnosis not present

## 2017-03-25 DIAGNOSIS — H409 Unspecified glaucoma: Secondary | ICD-10-CM | POA: Diagnosis present

## 2017-03-25 DIAGNOSIS — Z8249 Family history of ischemic heart disease and other diseases of the circulatory system: Secondary | ICD-10-CM | POA: Diagnosis not present

## 2017-03-25 DIAGNOSIS — Z7902 Long term (current) use of antithrombotics/antiplatelets: Secondary | ICD-10-CM | POA: Diagnosis not present

## 2017-03-25 DIAGNOSIS — K8050 Calculus of bile duct without cholangitis or cholecystitis without obstruction: Secondary | ICD-10-CM | POA: Diagnosis not present

## 2017-03-25 DIAGNOSIS — I10 Essential (primary) hypertension: Secondary | ICD-10-CM | POA: Diagnosis present

## 2017-03-25 DIAGNOSIS — Z9861 Coronary angioplasty status: Secondary | ICD-10-CM | POA: Diagnosis not present

## 2017-03-25 DIAGNOSIS — Z79890 Hormone replacement therapy (postmenopausal): Secondary | ICD-10-CM | POA: Diagnosis not present

## 2017-03-25 DIAGNOSIS — I739 Peripheral vascular disease, unspecified: Secondary | ICD-10-CM | POA: Diagnosis not present

## 2017-03-25 DIAGNOSIS — K838 Other specified diseases of biliary tract: Secondary | ICD-10-CM | POA: Diagnosis not present

## 2017-03-25 DIAGNOSIS — R1010 Upper abdominal pain, unspecified: Secondary | ICD-10-CM | POA: Diagnosis not present

## 2017-03-25 DIAGNOSIS — K859 Acute pancreatitis without necrosis or infection, unspecified: Secondary | ICD-10-CM | POA: Diagnosis not present

## 2017-03-25 DIAGNOSIS — R7881 Bacteremia: Secondary | ICD-10-CM | POA: Diagnosis present

## 2017-03-25 DIAGNOSIS — K8070 Calculus of gallbladder and bile duct without cholecystitis without obstruction: Secondary | ICD-10-CM | POA: Diagnosis present

## 2017-03-25 DIAGNOSIS — R1013 Epigastric pain: Secondary | ICD-10-CM | POA: Diagnosis present

## 2017-03-25 DIAGNOSIS — K819 Cholecystitis, unspecified: Secondary | ICD-10-CM | POA: Diagnosis not present

## 2017-03-25 DIAGNOSIS — R109 Unspecified abdominal pain: Secondary | ICD-10-CM | POA: Diagnosis not present

## 2017-03-25 DIAGNOSIS — Z79899 Other long term (current) drug therapy: Secondary | ICD-10-CM | POA: Insufficient documentation

## 2017-03-25 DIAGNOSIS — Z951 Presence of aortocoronary bypass graft: Secondary | ICD-10-CM | POA: Diagnosis not present

## 2017-03-25 DIAGNOSIS — E871 Hypo-osmolality and hyponatremia: Secondary | ICD-10-CM | POA: Diagnosis present

## 2017-03-25 DIAGNOSIS — B9689 Other specified bacterial agents as the cause of diseases classified elsewhere: Secondary | ICD-10-CM | POA: Diagnosis present

## 2017-03-25 DIAGNOSIS — E860 Dehydration: Secondary | ICD-10-CM | POA: Diagnosis present

## 2017-03-25 DIAGNOSIS — S0990XA Unspecified injury of head, initial encounter: Secondary | ICD-10-CM | POA: Diagnosis not present

## 2017-03-25 DIAGNOSIS — E785 Hyperlipidemia, unspecified: Secondary | ICD-10-CM | POA: Diagnosis present

## 2017-03-25 DIAGNOSIS — J449 Chronic obstructive pulmonary disease, unspecified: Secondary | ICD-10-CM | POA: Diagnosis not present

## 2017-03-25 DIAGNOSIS — R55 Syncope and collapse: Secondary | ICD-10-CM

## 2017-03-25 DIAGNOSIS — A419 Sepsis, unspecified organism: Secondary | ICD-10-CM | POA: Diagnosis not present

## 2017-03-25 DIAGNOSIS — E039 Hypothyroidism, unspecified: Secondary | ICD-10-CM | POA: Diagnosis present

## 2017-03-25 DIAGNOSIS — R17 Unspecified jaundice: Secondary | ICD-10-CM | POA: Diagnosis not present

## 2017-03-25 DIAGNOSIS — N179 Acute kidney failure, unspecified: Secondary | ICD-10-CM | POA: Diagnosis present

## 2017-03-25 LAB — CBC WITH DIFFERENTIAL/PLATELET
BASOS PCT: 0 %
Basophils Absolute: 0.1 10*3/uL (ref 0–0.1)
EOS PCT: 4 %
Eosinophils Absolute: 0.6 10*3/uL (ref 0–0.7)
HCT: 35.3 % — ABNORMAL LOW (ref 40.0–52.0)
HEMOGLOBIN: 11.8 g/dL — AB (ref 13.0–18.0)
LYMPHS ABS: 2.5 10*3/uL (ref 1.0–3.6)
Lymphocytes Relative: 17 %
MCH: 31.1 pg (ref 26.0–34.0)
MCHC: 33.4 g/dL (ref 32.0–36.0)
MCV: 93.1 fL (ref 80.0–100.0)
MONO ABS: 0.8 10*3/uL (ref 0.2–1.0)
MONOS PCT: 6 %
NEUTROS PCT: 73 %
Neutro Abs: 10.4 10*3/uL — ABNORMAL HIGH (ref 1.4–6.5)
PLATELETS: 256 10*3/uL (ref 150–440)
RBC: 3.8 MIL/uL — ABNORMAL LOW (ref 4.40–5.90)
RDW: 13.4 % (ref 11.5–14.5)
WBC: 14.4 10*3/uL — ABNORMAL HIGH (ref 3.8–10.6)

## 2017-03-25 LAB — TROPONIN I

## 2017-03-25 LAB — COMPREHENSIVE METABOLIC PANEL
ALBUMIN: 3.8 g/dL (ref 3.5–5.0)
ALK PHOS: 61 U/L (ref 38–126)
ALT: 34 U/L (ref 17–63)
AST: 67 U/L — ABNORMAL HIGH (ref 15–41)
Anion gap: 9 (ref 5–15)
BUN: 31 mg/dL — ABNORMAL HIGH (ref 6–20)
CO2: 24 mmol/L (ref 22–32)
Calcium: 8.8 mg/dL — ABNORMAL LOW (ref 8.9–10.3)
Chloride: 101 mmol/L (ref 101–111)
Creatinine, Ser: 1.55 mg/dL — ABNORMAL HIGH (ref 0.61–1.24)
GFR calc non Af Amer: 41 mL/min — ABNORMAL LOW (ref >60)
GFR, EST AFRICAN AMERICAN: 48 mL/min — AB (ref >60)
GLUCOSE: 163 mg/dL — AB (ref 65–99)
POTASSIUM: 4.2 mmol/L (ref 3.5–5.1)
SODIUM: 134 mmol/L — AB (ref 135–145)
Total Bilirubin: 1 mg/dL (ref 0.3–1.2)
Total Protein: 6.8 g/dL (ref 6.5–8.1)

## 2017-03-25 LAB — LIPASE, BLOOD: Lipase: 1420 U/L — ABNORMAL HIGH (ref 11–51)

## 2017-03-25 MED ORDER — IOPAMIDOL (ISOVUE-300) INJECTION 61%
75.0000 mL | Freq: Once | INTRAVENOUS | Status: AC | PRN
  Administered 2017-03-25: 75 mL via INTRAVENOUS

## 2017-03-25 NOTE — Discharge Instructions (Signed)
Please seek medical attention for any high fevers, chest pain, shortness of breath, change in behavior, persistent vomiting, bloody stool or any other new or concerning symptoms.  

## 2017-03-25 NOTE — ED Notes (Signed)
Pt up to bathroom with assistance 

## 2017-03-25 NOTE — ED Triage Notes (Signed)
Pt brought in via ems from home with syncopal episode after taking 1 ntg at 1530 today for abd/chest pain.  Pt alert on arrival.  Iv infusing  md at bedside.

## 2017-03-25 NOTE — ED Notes (Signed)
Patient transported to CT 

## 2017-03-25 NOTE — ED Provider Notes (Signed)
[facility] Emergency Department Provider Note   ____________________________________________   I have reviewed the triage vital signs and the nursing notes.   HISTORY  Chief Complaint Abdominal pain  History limited by: Not Limited   HPI Jason House is a 77 y.o. male who presents to the emergency department today via EMS with chief complaint of abdominal pain.    LOCATION:epigastric region DURATION:hours TIMING: started suddenly SEVERITY: severe QUALITY: "hurting" CONTEXT: patient states he was sitting on the couch when the pain started. It was severe. Located in the epigastric region. Tried taking a nitroglycerin without any relief. However after taking the nitroglycerin had syncopal episode, fell backward and hit his head.  MODIFYING FACTORS: nitroglycerin did not help  ASSOCIATED SYMPTOMS: denies any vomiting. Denies any fevers.  Per medical record review patient has a history of CAD, CABG.   Past Medical History:  Diagnosis Date  . Anemia   . Coronary artery disease   . Glaucoma   . H/O ETOH abuse   . History of shingles   . Hyperlipidemia   . Hypertension     Patient Active Problem List   Diagnosis Date Noted  . Bilateral carotid artery stenosis 01/28/2017  . Hyperlipidemia 01/28/2017  . Essential hypertension 01/28/2017    Past Surgical History:  Procedure Laterality Date  . COLONOSCOPY WITH PROPOFOL N/A 09/23/2014   Procedure: COLONOSCOPY WITH PROPOFOL;  Surgeon: Josefine Class, MD;  Location: [facility] ENDOSCOPY;  Service: Endoscopy;  Laterality: N/A;  . CORONARY ANGIOPLASTY    . CORONARY ARTERY BYPASS GRAFT    . EYE SURGERY      Prior to Admission medications   Medication Sig Start Date End Date Taking? Authorizing Provider  amLODipine (NORVASC) 10 MG tablet Take 10 mg by mouth daily.    [provider]  aspirin EC 81 MG tablet Take 81 mg by mouth daily.    [provider]  atorvastatin (LIPITOR) 80  MG tablet Take 80 mg by mouth daily.    [provider]  budesonide-formoterol (SYMBICORT) 160-4.5 MCG/ACT inhaler Inhale into the lungs. 09/13/16 09/13/17  [provider]  clopidogrel (PLAVIX) 75 MG tablet Take 75 mg by mouth daily.    [provider]  colchicine 0.6 MG tablet Take 0.6 mg by mouth as directed. Take 2 tablets by mouth at first sign of gout, followed by one tablet in one hour    [provider]  cyclobenzaprine (FLEXERIL) 10 MG tablet TAKE 1 TABLET BY MOUTH NIGHTLY 10/09/16   [provider]  dorzolamide-timolol (COSOPT) 22.3-6.8 MG/ML ophthalmic solution Place 1 drop into both eyes 2 (two) times daily.    [provider]  hydrochlorothiazide (MICROZIDE) 12.5 MG capsule Take 25 mg by mouth daily.     [provider]  latanoprost (XALATAN) 0.005 % ophthalmic solution Place 1 drop into both eyes at bedtime.    [provider]  lisinopril (PRINIVIL,ZESTRIL) 40 MG tablet Take 40 mg by mouth daily.    [provider]  metoprolol succinate (TOPROL-XL) 25 MG 24 hr tablet Take 25 mg by mouth daily.    [provider]  metoprolol tartrate (LOPRESSOR) 25 MG tablet  11/29/16   [provider]  nitroGLYCERIN (NITROSTAT) 0.4 MG SL tablet Place under the tongue.    [provider]  vitamin B-12 (CYANOCOBALAMIN) 1000 MCG tablet Take 1,000 mcg by mouth daily.    [provider]  vitamin B-12 (CYANOCOBALAMIN) 1000 MCG tablet Take by mouth.  [provider]    Allergies Patient has no known allergies.  No family history on file.  Social History Social History   Tobacco Use  . Smoking status: Former Research scientist (life sciences)  . Smokeless tobacco: Never Used  Substance Use Topics  . Alcohol use: No    Frequency: Never  . Drug use: No    Review of Systems Constitutional: No fever/chills Eyes: No visual changes. ENT: No sore throat. Cardiovascular: Denies chest pain. Respiratory:  Denies shortness of breath. Gastrointestinal: Positive for abdominal pain.   Genitourinary: Negative for dysuria. Musculoskeletal: Negative for back pain. Skin: Negative for rash. Neurological: Negative for headaches, focal weakness or numbness.  ____________________________________________   PHYSICAL EXAM:  VITAL SIGNS: ED Triage Vitals  Enc Vitals Group     BP 03/25/17 1647 (!) 155/130     Pulse Rate 03/25/17 1647 83     Resp 03/25/17 1647 20     Temp 03/25/17 1647 98.6 F (37 C)     Temp Source 03/25/17 1647 Oral     SpO2 03/25/17 1647 98 %     Weight 03/25/17 1644 170 lb (77.1 kg)     Height 03/25/17 1644 5\' 7"  (1.702 m)     Head Circumference --      Peak Flow --      Pain Score 03/25/17 1644 10   Constitutional: Alert and oriented. Appears uncomfortable.  Eyes: Conjunctivae are normal.  ENT   Head: Normocephalic and atraumatic.   Nose: No congestion/rhinnorhea.   Mouth/Throat: Mucous membranes are moist.   Neck: No stridor. Hematological/Lymphatic/Immunilogical: No cervical lymphadenopathy. Cardiovascular: Normal rate, regular rhythm.  No murmurs, rubs, or gallops.  Respiratory: Normal respiratory effort without tachypnea nor retractions. Breath sounds are clear and equal bilaterally. No wheezes/rales/rhonchi. Gastrointestinal: Soft and tender to palpation in the upper abdomen. No rebound. No guarding.  Genitourinary: Deferred Musculoskeletal: Normal range of motion in all extremities. No lower extremity edema. Neurologic:  Normal speech and language. No gross focal neurologic deficits are appreciated.  Skin:  Skin is warm, dry and intact. No rash noted. Psychiatric: Mood and affect are normal. Speech and behavior are normal. Patient exhibits appropriate insight and judgment.  ____________________________________________    LABS (pertinent positives/negatives)  Trop <0.03 Lipase 1420 CMP na 134, cr 1.55 CBC wbc 14.4, hgb 11.8, plt  256 ____________________________________________   EKG  I, Nance Pear, attending physician, personally viewed and interpreted this EKG  EKG Time: 1646 Rate: 86 Rhythm: normal sinus rhythm Axis: normal Intervals: qtc 445 QRS: narrow ST changes: no st elevation Impression: normal ekg   ____________________________________________    RADIOLOGY  CT head No acute intracranial process  DG abd No bowel distention   CT abd Distention of CBD  ____________________________________________   PROCEDURES  Procedures  ____________________________________________   INITIAL IMPRESSION / ASSESSMENT AND PLAN / ED COURSE  Pertinent labs & imaging results that were available during my care of the patient were reviewed by me and considered in my medical decision making (see chart for details).  Patient presented to the emergency department today because of concerns for epigastric abdominal pain that started suddenly.  Initially on exam patient was somewhat uncomfortable.  He was tender to the palpation of the upper abdomen without rebound or guarding.  Differential would be broad including SBO, pancreatitis, gastritis, hepatitis, duodenitis, aortic disease, gastroenteritis amongst other etiologies.  Workup showed elevated lipase.  CT scan was obtained which showed some dilation of the common bile duct.  However while here in the  emergency department the pain went away.  I did have a discussion with the patient and did offer and recommend admission for further workup of pancreatitis.  He however did not want to be admitted.  We did a p.o. trial and the patient did fine and had no further pain.  Did discuss with the patient importance of follow-up with primary care.    ____________________________________________   FINAL CLINICAL IMPRESSION(S) / ED DIAGNOSES  Final diagnoses:  Epigastric pain  Acute pancreatitis, unspecified complication status, unspecified pancreatitis type      Note: This dictation was prepared with Dragon dictation. Any transcriptional errors that result from this process are unintentional     Nance Pear, MD 03/25/17 2044

## 2017-03-25 NOTE — ED Notes (Signed)
Pt brought in via ems from home with syncopal episode after taking 1 ntg.  Pt reports upper abdpain/chest pain.  Diarrhea this am.  No vomiting.  md at bedside on arrival   Iv infusing on arrival   ekg done and labs sent.

## 2017-03-27 ENCOUNTER — Inpatient Hospital Stay: Payer: PPO | Admitting: Anesthesiology

## 2017-03-27 ENCOUNTER — Inpatient Hospital Stay
Admission: EM | Admit: 2017-03-27 | Discharge: 2017-04-01 | DRG: 439 | Disposition: A | Discharge status: Home / Self Care | Payer: PPO | Attending: Internal Medicine | Admitting: Internal Medicine

## 2017-03-27 ENCOUNTER — Other Ambulatory Visit: Payer: Self-pay

## 2017-03-27 ENCOUNTER — Encounter: Payer: Self-pay | Admitting: Emergency Medicine

## 2017-03-27 ENCOUNTER — Encounter
Admission: EM | Disposition: A | Discharge status: Home / Self Care | Payer: Self-pay | Source: Home / Self Care | Attending: Internal Medicine

## 2017-03-27 ENCOUNTER — Inpatient Hospital Stay: Payer: PPO

## 2017-03-27 DIAGNOSIS — E039 Hypothyroidism, unspecified: Secondary | ICD-10-CM | POA: Diagnosis present

## 2017-03-27 DIAGNOSIS — E860 Dehydration: Secondary | ICD-10-CM | POA: Diagnosis present

## 2017-03-27 DIAGNOSIS — E785 Hyperlipidemia, unspecified: Secondary | ICD-10-CM | POA: Diagnosis present

## 2017-03-27 DIAGNOSIS — N179 Acute kidney failure, unspecified: Secondary | ICD-10-CM | POA: Diagnosis present

## 2017-03-27 DIAGNOSIS — B9689 Other specified bacterial agents as the cause of diseases classified elsewhere: Secondary | ICD-10-CM | POA: Diagnosis present

## 2017-03-27 DIAGNOSIS — K8070 Calculus of gallbladder and bile duct without cholecystitis without obstruction: Secondary | ICD-10-CM | POA: Diagnosis present

## 2017-03-27 DIAGNOSIS — H409 Unspecified glaucoma: Secondary | ICD-10-CM | POA: Diagnosis present

## 2017-03-27 DIAGNOSIS — Z7982 Long term (current) use of aspirin: Secondary | ICD-10-CM | POA: Diagnosis not present

## 2017-03-27 DIAGNOSIS — E871 Hypo-osmolality and hyponatremia: Secondary | ICD-10-CM | POA: Diagnosis present

## 2017-03-27 DIAGNOSIS — R1013 Epigastric pain: Secondary | ICD-10-CM | POA: Diagnosis present

## 2017-03-27 DIAGNOSIS — R17 Unspecified jaundice: Secondary | ICD-10-CM | POA: Diagnosis not present

## 2017-03-27 DIAGNOSIS — Z79890 Hormone replacement therapy (postmenopausal): Secondary | ICD-10-CM | POA: Diagnosis not present

## 2017-03-27 DIAGNOSIS — Z9861 Coronary angioplasty status: Secondary | ICD-10-CM

## 2017-03-27 DIAGNOSIS — Z951 Presence of aortocoronary bypass graft: Secondary | ICD-10-CM

## 2017-03-27 DIAGNOSIS — R7881 Bacteremia: Secondary | ICD-10-CM | POA: Diagnosis present

## 2017-03-27 DIAGNOSIS — K8050 Calculus of bile duct without cholangitis or cholecystitis without obstruction: Secondary | ICD-10-CM | POA: Diagnosis not present

## 2017-03-27 DIAGNOSIS — I2510 Atherosclerotic heart disease of native coronary artery without angina pectoris: Secondary | ICD-10-CM | POA: Diagnosis present

## 2017-03-27 DIAGNOSIS — Z8249 Family history of ischemic heart disease and other diseases of the circulatory system: Secondary | ICD-10-CM | POA: Diagnosis not present

## 2017-03-27 DIAGNOSIS — I10 Essential (primary) hypertension: Secondary | ICD-10-CM | POA: Diagnosis present

## 2017-03-27 DIAGNOSIS — K859 Acute pancreatitis without necrosis or infection, unspecified: Secondary | ICD-10-CM

## 2017-03-27 DIAGNOSIS — Z87891 Personal history of nicotine dependence: Secondary | ICD-10-CM

## 2017-03-27 DIAGNOSIS — Z7902 Long term (current) use of antithrombotics/antiplatelets: Secondary | ICD-10-CM

## 2017-03-27 DIAGNOSIS — K851 Biliary acute pancreatitis without necrosis or infection: Principal | ICD-10-CM

## 2017-03-27 LAB — URINALYSIS, COMPLETE (UACMP) WITH MICROSCOPIC
Bacteria, UA: NONE SEEN
Bilirubin Urine: NEGATIVE
GLUCOSE, UA: NEGATIVE mg/dL
HGB URINE DIPSTICK: NEGATIVE
Ketones, ur: NEGATIVE mg/dL
Leukocytes, UA: NEGATIVE
Nitrite: NEGATIVE
PH: 5 (ref 5.0–8.0)
Protein, ur: 30 mg/dL — AB
SPECIFIC GRAVITY, URINE: 1.018 (ref 1.005–1.030)

## 2017-03-27 LAB — BILIRUBIN, FRACTIONATED(TOT/DIR/INDIR)
BILIRUBIN INDIRECT: 2.4 mg/dL — AB (ref 0.3–0.9)
BILIRUBIN TOTAL: 4.6 mg/dL — AB (ref 0.3–1.2)
Bilirubin, Direct: 2.2 mg/dL — ABNORMAL HIGH (ref 0.1–0.5)

## 2017-03-27 LAB — LIPASE, BLOOD: Lipase: 6295 U/L — ABNORMAL HIGH (ref 11–51)

## 2017-03-27 LAB — COMPREHENSIVE METABOLIC PANEL
ALK PHOS: 49 U/L (ref 38–126)
ALT: 69 U/L — AB (ref 17–63)
AST: 57 U/L — AB (ref 15–41)
Albumin: 4.5 g/dL (ref 3.5–5.0)
Anion gap: 10 (ref 5–15)
BUN: 33 mg/dL — AB (ref 6–20)
CALCIUM: 9.4 mg/dL (ref 8.9–10.3)
CHLORIDE: 98 mmol/L — AB (ref 101–111)
CO2: 24 mmol/L (ref 22–32)
CREATININE: 1.86 mg/dL — AB (ref 0.61–1.24)
GFR calc Af Amer: 39 mL/min — ABNORMAL LOW (ref >60)
GFR calc non Af Amer: 33 mL/min — ABNORMAL LOW (ref >60)
GLUCOSE: 108 mg/dL — AB (ref 65–99)
Potassium: 4.5 mmol/L (ref 3.5–5.1)
SODIUM: 132 mmol/L — AB (ref 135–145)
Total Bilirubin: 4.9 mg/dL — ABNORMAL HIGH (ref 0.3–1.2)
Total Protein: 7.5 g/dL (ref 6.5–8.1)

## 2017-03-27 LAB — CBC
HCT: 35.2 % — ABNORMAL LOW (ref 40.0–52.0)
Hemoglobin: 11.8 g/dL — ABNORMAL LOW (ref 13.0–18.0)
MCH: 31.3 pg (ref 26.0–34.0)
MCHC: 33.5 g/dL (ref 32.0–36.0)
MCV: 93.5 fL (ref 80.0–100.0)
PLATELETS: 271 10*3/uL (ref 150–440)
RBC: 3.76 MIL/uL — AB (ref 4.40–5.90)
RDW: 13.4 % (ref 11.5–14.5)
WBC: 14.9 10*3/uL — ABNORMAL HIGH (ref 3.8–10.6)

## 2017-03-27 SURGERY — ENDOSCOPIC RETROGRADE CHOLANGIOPANCREATOGRAPHY (ERCP) WITH PROPOFOL
Anesthesia: General

## 2017-03-27 MED ORDER — LIDOCAINE HCL (PF) 2 % IJ SOLN
INTRAMUSCULAR | Status: AC
  Filled 2017-03-27: qty 10

## 2017-03-27 MED ORDER — ONDANSETRON HCL 4 MG/2ML IJ SOLN
4.0000 mg | Freq: Four times a day (QID) | INTRAMUSCULAR | Status: DC | PRN
  Administered 2017-03-31: 4 mg via INTRAVENOUS
  Filled 2017-03-27: qty 2

## 2017-03-27 MED ORDER — LIDOCAINE HCL (CARDIAC) 20 MG/ML IV SOLN
INTRAVENOUS | Status: DC | PRN
  Administered 2017-03-27: 60 mg via INTRAVENOUS

## 2017-03-27 MED ORDER — VITAMIN B-12 1000 MCG PO TABS
1000.0000 ug | ORAL_TABLET | Freq: Every day | ORAL | Status: DC
  Administered 2017-03-28 – 2017-04-01 (×5): 1000 ug via ORAL
  Filled 2017-03-27 (×5): qty 1

## 2017-03-27 MED ORDER — DORZOLAMIDE HCL-TIMOLOL MAL 2-0.5 % OP SOLN
1.0000 [drp] | Freq: Two times a day (BID) | OPHTHALMIC | Status: DC
  Administered 2017-03-27 – 2017-04-01 (×10): 1 [drp] via OPHTHALMIC
  Filled 2017-03-27: qty 10

## 2017-03-27 MED ORDER — PROPOFOL 500 MG/50ML IV EMUL
INTRAVENOUS | Status: DC | PRN
  Administered 2017-03-27: 150 ug/kg/min via INTRAVENOUS

## 2017-03-27 MED ORDER — ONDANSETRON HCL 4 MG/2ML IJ SOLN
4.0000 mg | Freq: Once | INTRAMUSCULAR | Status: AC
  Administered 2017-03-27: 4 mg via INTRAVENOUS
  Filled 2017-03-27: qty 2

## 2017-03-27 MED ORDER — ATORVASTATIN CALCIUM 20 MG PO TABS
80.0000 mg | ORAL_TABLET | Freq: Every day | ORAL | Status: DC
  Administered 2017-03-28 – 2017-04-01 (×5): 80 mg via ORAL
  Filled 2017-03-27 (×6): qty 4

## 2017-03-27 MED ORDER — SODIUM CHLORIDE 0.9 % IV SOLN
INTRAVENOUS | Status: DC
  Administered 2017-03-27: 18:00:00 via INTRAVENOUS

## 2017-03-27 MED ORDER — INDOMETHACIN 50 MG RE SUPP: 100.0000 mg | Freq: Once | RECTAL | Status: DC

## 2017-03-27 MED ORDER — METOPROLOL TARTRATE 25 MG PO TABS
12.5000 mg | ORAL_TABLET | Freq: Two times a day (BID) | ORAL | Status: DC
  Administered 2017-03-28 – 2017-04-01 (×8): 12.5 mg via ORAL
  Filled 2017-03-27 (×8): qty 1

## 2017-03-27 MED ORDER — POLYETHYLENE GLYCOL 3350 17 G PO PACK: 17.0000 g | PACK | Freq: Every day | ORAL | Status: DC | PRN

## 2017-03-27 MED ORDER — PROPOFOL 10 MG/ML IV BOLUS
INTRAVENOUS | Status: DC | PRN
  Administered 2017-03-27: 60 mg via INTRAVENOUS

## 2017-03-27 MED ORDER — PROPOFOL 500 MG/50ML IV EMUL
INTRAVENOUS | Status: AC
  Filled 2017-03-27: qty 50

## 2017-03-27 MED ORDER — LISINOPRIL 20 MG PO TABS
40.0000 mg | ORAL_TABLET | Freq: Every day | ORAL | Status: DC
  Administered 2017-03-28 – 2017-03-30 (×3): 40 mg via ORAL
  Filled 2017-03-27 (×3): qty 2

## 2017-03-27 MED ORDER — ACETAMINOPHEN 650 MG RE SUPP: 650.0000 mg | Freq: Four times a day (QID) | RECTAL | Status: DC | PRN

## 2017-03-27 MED ORDER — LEVOTHYROXINE SODIUM 50 MCG PO TABS
50.0000 ug | ORAL_TABLET | Freq: Every day | ORAL | Status: DC
  Administered 2017-03-28 – 2017-04-01 (×5): 50 ug via ORAL
  Filled 2017-03-27 (×5): qty 1

## 2017-03-27 MED ORDER — CYCLOBENZAPRINE HCL 10 MG PO TABS
10.0000 mg | ORAL_TABLET | Freq: Every day | ORAL | Status: DC
  Administered 2017-03-27 – 2017-03-31 (×5): 10 mg via ORAL
  Filled 2017-03-27 (×5): qty 1

## 2017-03-27 MED ORDER — SODIUM CHLORIDE 0.9 % IV SOLN
INTRAVENOUS | Status: DC
  Administered 2017-03-27 – 2017-03-28 (×3): via INTRAVENOUS

## 2017-03-27 MED ORDER — PHENYLEPHRINE HCL 10 MG/ML IJ SOLN
INTRAMUSCULAR | Status: DC | PRN
  Administered 2017-03-27 (×2): 100 ug via INTRAVENOUS

## 2017-03-27 MED ORDER — HYDROMORPHONE HCL 1 MG/ML IJ SOLN
0.5000 mg | INTRAMUSCULAR | Status: DC | PRN
  Administered 2017-03-28 – 2017-03-29 (×5): 0.5 mg via INTRAVENOUS
  Filled 2017-03-27 (×5): qty 0.5

## 2017-03-27 MED ORDER — SODIUM CHLORIDE 0.9 % IV BOLUS (SEPSIS)
1000.0000 mL | Freq: Once | INTRAVENOUS | Status: AC
  Administered 2017-03-27: 1000 mL via INTRAVENOUS

## 2017-03-27 MED ORDER — ACETAMINOPHEN 325 MG PO TABS
650.0000 mg | ORAL_TABLET | Freq: Four times a day (QID) | ORAL | Status: DC | PRN
  Administered 2017-03-29 – 2017-03-31 (×2): 650 mg via ORAL
  Filled 2017-03-27 (×2): qty 2

## 2017-03-27 MED ORDER — MORPHINE SULFATE (PF) 4 MG/ML IV SOLN
4.0000 mg | Freq: Once | INTRAVENOUS | Status: AC
Start: 2017-03-27 — End: 2017-03-27
  Administered 2017-03-27: 4 mg via INTRAVENOUS
  Filled 2017-03-27: qty 1

## 2017-03-27 MED ORDER — LATANOPROST 0.005 % OP SOLN
1.0000 [drp] | Freq: Every day | OPHTHALMIC | Status: DC
  Administered 2017-03-27 – 2017-03-31 (×5): 1 [drp] via OPHTHALMIC
  Filled 2017-03-27: qty 2.5

## 2017-03-27 MED ORDER — GLYCOPYRROLATE 0.2 MG/ML IJ SOLN
INTRAMUSCULAR | Status: AC
  Filled 2017-03-27: qty 1

## 2017-03-27 MED ORDER — ENOXAPARIN SODIUM 40 MG/0.4ML ~~LOC~~ SOLN
40.0000 mg | SUBCUTANEOUS | Status: DC
  Administered 2017-03-28 – 2017-04-01 (×5): 40 mg via SUBCUTANEOUS
  Filled 2017-03-27 (×5): qty 0.4

## 2017-03-27 MED ORDER — AMLODIPINE BESYLATE 5 MG PO TABS
5.0000 mg | ORAL_TABLET | Freq: Every day | ORAL | Status: DC
  Administered 2017-03-28 – 2017-04-01 (×4): 5 mg via ORAL
  Filled 2017-03-27 (×4): qty 1

## 2017-03-27 MED ORDER — ONDANSETRON HCL 4 MG PO TABS: 4.0000 mg | ORAL_TABLET | Freq: Four times a day (QID) | ORAL | Status: DC | PRN

## 2017-03-27 NOTE — Anesthesia Preprocedure Evaluation (Signed)
Anesthesia Evaluation  Patient identified by MRN, date of birth, ID band Patient awake    Reviewed: Allergy & Precautions, H&P , NPO status , Patient's Chart, lab work & pertinent test results, reviewed documented beta blocker date and time   History of Anesthesia Complications Negative for: history of anesthetic complications  Airway Mallampati: I  TM Distance: >3 FB Neck ROM: full    Dental  (+) Edentulous Upper, Edentulous Lower   Pulmonary neg shortness of breath, neg sleep apnea, COPD, neg recent URI, former smoker,           Cardiovascular Exercise Tolerance: Good hypertension, (-) angina+ CAD, + Cardiac Stents, + CABG and + Peripheral Vascular Disease  (-) Past MI (-) dysrhythmias (-) Valvular Problems/Murmurs     Neuro/Psych negative neurological ROS  negative psych ROS   GI/Hepatic negative GI ROS, Neg liver ROS,   Endo/Other  neg diabetesHypothyroidism   Renal/GU negative Renal ROS  negative genitourinary   Musculoskeletal   Abdominal   Peds  Hematology negative hematology ROS (+)   Anesthesia Other Findings Past Medical History: No date: Anemia No date: Coronary artery disease No date: Glaucoma No date: H/O ETOH abuse No date: History of shingles No date: Hyperlipidemia No date: Hypertension   Reproductive/Obstetrics negative OB ROS                             Anesthesia Physical Anesthesia Plan  ASA: III  Anesthesia Plan: General   Post-op Pain Management:    Induction: Intravenous  PONV Risk Score and Plan: 2 and Propofol infusion  Airway Management Planned: Nasal Cannula  Additional Equipment:   Intra-op Plan:   Post-operative Plan:   Informed Consent: I have reviewed the patients History and Physical, chart, labs and discussed the procedure including the risks, benefits and alternatives for the proposed anesthesia with the patient or authorized  representative who has indicated his/her understanding and acceptance.   Dental Advisory Given  Plan Discussed with: Anesthesiologist, CRNA and Surgeon  Anesthesia Plan Comments:         Anesthesia Quick Evaluation

## 2017-03-27 NOTE — Op Note (Signed)
[facility] Gastroenterology Patient Name: Jason House Procedure Date: 03/27/2017 6:27 PM MRN: [id number] Account #: [id number] Date of Birth: 29-Feb-1941 Admit Type: Inpatient Age: 77 Room: [facility] ENDO ROOM 4 Gender: Male Note Status: Finalized Procedure:            ERCP Indications:          Jaundice, Elevated liver enzymes, Acute pancreatitis Providers:            Lucilla Lame MD, MD Referring MD:         Leonie Douglas. Doy Hutching, MD (Referring MD) Medicines:            Propofol per Anesthesia Complications:        No immediate complications. Procedure:            Pre-Anesthesia Assessment:                       - Prior to the procedure, a History and Physical was                        performed, and patient medications and allergies were                        reviewed. The patient's tolerance of previous                        anesthesia was also reviewed. The risks and benefits of                        the procedure and the sedation options and risks were                        discussed with the patient. All questions were                        answered, and informed consent was obtained. Prior                        Anticoagulants: The patient has taken Plavix                        (clopidogrel), last dose was 1 day prior to procedure.                        ASA Grade Assessment: II - A patient with mild systemic                        disease. After reviewing the risks and benefits, the                        patient was deemed in satisfactory condition to undergo                        the procedure.                       After obtaining informed consent, the scope was passed                        under direct vision. Throughout the procedure, the  patient's blood pressure, pulse, and oxygen saturations                        were monitored continuously. The Endosonoscope was                        introduced through the mouth, and used  to inject                        contrast into and used to inject contrast into the bile                        duct. The ERCP was accomplished without difficulty. The                        patient tolerated the procedure well. Findings:      The scout film was normal. A wire was passed into the ventral pancreatic       duct. The major papilla contained an impacted stone. The major papilla       was bulging. The bile duct was deeply cannulated with the short-nosed       traction sphincterotome. Contrast was injected. I personally interpreted       the bile duct images. There was brisk flow of contrast through the       ducts. Image quality was excellent. Contrast extended to the entire       biliary tree. The lower third of the main bile duct contained one stone       mm. A wire was passed into the biliary tree. One 7 Fr by 5 cm plastic       stent with a single external flap and a single internal flap was placed       4 cm into the common bile duct. Bile flowed through the stent. The stent       was in good position. Impression:           - The major papilla appeared to be bulging.                       - Choledocholithiasis was found. Removal was not                        attempted; a stent was inserted.                       - One plastic stent was placed into the common bile                        duct.                       - No sphincterotomy due to recent Plavix. Recommendation:       - Return to this GI lab for stent removal at ERCP in 1                        month.                       - Clear liquid diet.                       -  Continue present medications. Procedure Code(s):    --- Professional ---                       [phone], Endoscopic retrograde cholangiopancreatography                        (ERCP); with placement of endoscopic stent into biliary                        or pancreatic duct, including pre- and post-dilation                        and guide wire passage,  when performed, including                        sphincterotomy, when performed, each stent                       [id number], Endoscopic catheterization of the biliary ductal                        system, radiological supervision and interpretation Diagnosis Code(s):    --- Professional ---                       R17, Unspecified jaundice                       R74.8, Abnormal levels of other serum enzymes                       K80.50, Calculus of bile duct without cholangitis or                        cholecystitis without obstruction CPT copyright 2016 American [employer] Association. All rights reserved. The codes documented in this report are preliminary and upon coder review may  be revised to meet current compliance requirements. Lucilla Lame MD, MD 03/27/2017 7:15:13 PM This report has been signed electronically. Number of Addenda: 0 Note Initiated On: 03/27/2017 6:27 PM      [facility]

## 2017-03-27 NOTE — ED Provider Notes (Signed)
[facility] Emergency Department Provider Note  ____________________________________________   First MD Initiated Contact with Patient 03/27/17 1326     (approximate)  I have reviewed the triage vital signs and the nursing notes.   HISTORY  Chief Complaint Abdominal Pain   HPI Jason House is a 77 y.o. male with a history of CAD as well as alcohol abuse who is presenting to the emergency department with persistent upper abdominal pain.  He says the pain is a "20 out of 10" and is sharp and radiating through to his back.  He was seen several days ago for similar issue and diagnosed with pancreatitis.  He was offered admission at that time but chose to go home instead.  However, the pain is persisted.  He has not had any further episodes of syncope that which she had after taking a nitroglycerin for the pain initially several days ago.  He says that he was vomiting yesterday as well as having diarrhea but he has had neither today.  No past history of pancreatitis or gallbladder disease.  No exacerbating or alleviating factors.  Patient is also not eaten anything yet today.   Past Medical History:  Diagnosis Date  . Anemia   . Coronary artery disease   . Glaucoma   . H/O ETOH abuse   . History of shingles   . Hyperlipidemia   . Hypertension     Patient Active Problem List   Diagnosis Date Noted  . Bilateral carotid artery stenosis 01/28/2017  . Hyperlipidemia 01/28/2017  . Essential hypertension 01/28/2017    Past Surgical History:  Procedure Laterality Date  . COLONOSCOPY WITH PROPOFOL N/A 09/23/2014   Procedure: COLONOSCOPY WITH PROPOFOL;  Surgeon: Josefine Class, MD;  Location: [facility] ENDOSCOPY;  Service: Endoscopy;  Laterality: N/A;  . CORONARY ANGIOPLASTY    . CORONARY ARTERY BYPASS GRAFT    . EYE SURGERY      Prior to Admission medications   Medication Sig Start Date End Date Taking? Authorizing Provider  amLODipine (NORVASC) 5 MG  tablet Take 1 tablet by mouth daily. 02/01/17  Yes [provider]  aspirin EC 81 MG tablet Take 81 mg by mouth daily.   Yes [provider]  atorvastatin (LIPITOR) 80 MG tablet Take 80 mg by mouth daily.   Yes [provider]  clopidogrel (PLAVIX) 75 MG tablet Take 75 mg by mouth daily.   Yes [provider]  dorzolamide-timolol (COSOPT) 22.3-6.8 MG/ML ophthalmic solution Place 1 drop into both eyes 2 (two) times daily.   Yes [provider]  hydrochlorothiazide (MICROZIDE) 12.5 MG capsule Take 12.5 mg by mouth at bedtime.    Yes [provider]  latanoprost (XALATAN) 0.005 % ophthalmic solution Place 1 drop into both eyes at bedtime.   Yes [provider]  levothyroxine (SYNTHROID, LEVOTHROID) 50 MCG tablet Take 1 tablet by mouth daily. 01/04/17  Yes [provider]  lisinopril (PRINIVIL,ZESTRIL) 40 MG tablet Take 40 mg by mouth daily.   Yes [provider]  metoprolol tartrate (LOPRESSOR) 25 MG tablet 12.5 mg 2 (two) times daily.  11/29/16  Yes [provider]  vitamin B-12 (CYANOCOBALAMIN) 1000 MCG tablet Take 1,000 mcg by mouth daily.   Yes [provider]  cyclobenzaprine (FLEXERIL) 10 MG tablet TAKE 1 TABLET BY MOUTH NIGHTLY 10/09/16   [provider]  nitroGLYCERIN (NITROSTAT) 0.4 MG SL tablet Place 0.4 mg under the tongue every 5 (five) minutes as needed.  [provider]    Allergies Patient has no known allergies.  No family history on file.  Social History Social History   Tobacco Use  . Smoking status: Former Research scientist (life sciences)  . Smokeless tobacco: Never Used  Substance Use Topics  . Alcohol use: No    Frequency: Never  . Drug use: No    Review of Systems  Constitutional: No fever/chills Eyes: No visual changes. ENT: No sore throat. Cardiovascular: Denies chest pain. Respiratory: Denies shortness of breath. Gastrointestinal:   No constipation. Genitourinary:  Negative for dysuria. Musculoskeletal: Negative for back pain. Skin: Negative for rash. Neurological: Negative for headaches, focal weakness or numbness.   ____________________________________________   PHYSICAL EXAM:  VITAL SIGNS: ED Triage Vitals  Enc Vitals Group     BP 03/27/17 1109 (!) 139/52     Pulse Rate 03/27/17 1109 80     Resp 03/27/17 1109 20     Temp 03/27/17 1109 97.8 F (36.6 C)     Temp Source 03/27/17 1109 Oral     SpO2 03/27/17 1109 97 %     Weight 03/27/17 1050 170 lb (77.1 kg)     Height 03/27/17 1050 5\' 7"  (1.702 m)     Head Circumference --      Peak Flow --      Pain Score 03/27/17 1049 8     Pain Loc --      Pain Edu? --      Excl. in [place]? --     Constitutional: Alert and oriented. Well appearing and in no acute distress. Eyes: Conjunctivae are normal.  Head: Atraumatic. Nose: No congestion/rhinnorhea. Mouth/Throat: Mucous membranes are moist.  Neck: No stridor.   Cardiovascular: Normal rate, regular rhythm. Grossly normal heart sounds.  Good peripheral circulation. Respiratory: Normal respiratory effort.  No retractions. Lungs CTAB. Gastrointestinal: Soft with diffuse moderate tenderness to palpation.  No distention. Musculoskeletal: No lower extremity tenderness nor edema.  No joint effusions. Neurologic:  Normal speech and language. No gross focal neurologic deficits are appreciated. Skin:  Skin is warm, dry and intact. No rash noted. Psychiatric: Mood and affect are normal. Speech and behavior are normal.  ____________________________________________   LABS (all labs ordered are listed, but only abnormal results are displayed)  Labs Reviewed  COMPREHENSIVE METABOLIC PANEL - Abnormal; Notable for the following components:      Result Value   Sodium 132 (*)    Chloride 98 (*)    Glucose, Bld 108 (*)    BUN 33 (*)    Creatinine, Ser 1.86 (*)    AST 57 (*)    ALT 69 (*)    Total Bilirubin 4.9 (*)    GFR calc non Af Amer 33 (*)     GFR calc Af Amer 39 (*)    All other components within normal limits  CBC - Abnormal; Notable for the following components:   WBC 14.9 (*)    RBC 3.76 (*)    Hemoglobin 11.8 (*)    HCT 35.2 (*)    All other components within normal limits  URINALYSIS, COMPLETE (UACMP) WITH MICROSCOPIC - Abnormal; Notable for the following components:   Color, Urine AMBER (*)    APPearance HAZY (*)    Protein, ur 30 (*)    Squamous Epithelial / LPF 0-5 (*)    All other components within normal limits  LIPASE, BLOOD   ____________________________________________  EKG  ED ECG REPORT I, Doran Stabler, the attending physician, personally viewed and interpreted  this ECG.   Date: 03/27/2017  EKG Time: 1126  Rate: 82  Rhythm: normal sinus rhythm  Axis: Normal  Intervals:none  ST&T Change: No ST segment elevation or depression.  No abnormal T wave inversion.  ____________________________________________  RADIOLOGY  Scans reviewed from visit several days ago and patient found to have dilated common bile duct 9 mm up from 6 mm. ____________________________________________   PROCEDURES  Procedure(s) performed:   Procedures  Critical Care performed:   ____________________________________________   INITIAL IMPRESSION / ASSESSMENT AND PLAN / ED COURSE  Pertinent labs & imaging results that were available during my care of the patient were reviewed by me and considered in my medical decision making (see chart for details).  Differential diagnosis includes, but is not limited to, biliary disease (biliary colic, acute cholecystitis, cholangitis, choledocholithiasis, etc), intrathoracic causes for epigastric abdominal pain including ACS, gastritis, duodenitis, pancreatitis, small bowel or large bowel obstruction, abdominal aortic aneurysm, hernia, and gastritis. As part of my medical decision making, I reviewed the following data within the electronic MEDICAL RECORD NUMBER Notes from prior ED  visits  ----------------------------------------- 2:32 PM on 03/27/2017 -----------------------------------------  I discussed the case with Dr. Vicente Males of GI who says that due to the patient's increased common bile duct dilatation as well as elevated bilirubin that he will require an ERCP.  He says that the patient to be admitted to the hospital here that he will talk to Dr. Allen Norris about doing an ERCP.  The patient is understanding of the need for admission to the hospital and willing to comply.  Signed out to Dr. Genia Harold.      ____________________________________________   FINAL CLINICAL IMPRESSION(S) / ED DIAGNOSES  Pancreatitis.  Choledocholithiasis.  Hyperbilirubinemia.  Upper abdominal pain.    NEW MEDICATIONS STARTED DURING THIS VISIT:  New Prescriptions   No medications on file     Note:  This document was prepared using Dragon voice recognition software and may include unintentional dictation errors.     Orbie Pyo, MD 03/27/17 [phone]

## 2017-03-27 NOTE — Progress Notes (Signed)
Family Meeting Note  Advance Directive:no  Today a meeting took place with the Patient.and spouse   The following clinical team members were present during this meeting:MD  The following were discussed:Patient's diagnosis: CBD duct dilation CAD , Patient's progosis: Unable to determine and Goals for treatment: Full Code  Additional follow-up to be provided: Chaplain consultation for advanced directives  Time spent during discussion: 16 minutes  Jacion Dismore, MD

## 2017-03-27 NOTE — ED Triage Notes (Signed)
Pt here with c/o abdominal pain that began yesterday, was seen here yesterday as well, had a syncopal episode prior to arrival to ED yesterday. Denies vomiting today. Pt states Dr Archie Balboa told him to come back if still having pain or complications.

## 2017-03-27 NOTE — Anesthesia Postprocedure Evaluation (Signed)
Anesthesia Post Note  Patient: Jason House  Procedure(s) Performed: ENDOSCOPIC RETROGRADE CHOLANGIOPANCREATOGRAPHY (ERCP) WITH PROPOFOL (N/A )  Patient location during evaluation: Endoscopy Anesthesia Type: General Level of consciousness: awake and alert Pain management: pain level controlled Vital Signs Assessment: post-procedure vital signs reviewed and stable Respiratory status: spontaneous breathing, nonlabored ventilation, respiratory function stable and patient connected to nasal cannula oxygen Cardiovascular status: blood pressure returned to baseline and stable Postop Assessment: no apparent nausea or vomiting Anesthetic complications: no     Last Vitals:  Vitals:   03/27/17 2040 03/27/17 2134  BP: (!) 160/52 (!) 129/45  Pulse: (!) 103 (!) 103  Resp:    Temp: 37 C 36.9 C  SpO2: 95% 95%    Last Pain:  Vitals:   03/27/17 2134  TempSrc: Oral  PainSc:                  Martha Clan

## 2017-03-27 NOTE — Anesthesia Post-op Follow-up Note (Signed)
Anesthesia QCDR form completed.        

## 2017-03-27 NOTE — H&P (Signed)
[facility] NAME: Markeese Boyajian    MR#:  [id number]  DATE OF BIRTH:  [birth year]  DATE OF ADMISSION:  03/27/2017  PRIMARY CARE PHYSICIAN: Idelle Crouch, MD   REQUESTING/REFERRING PHYSICIAN: dr Dineen Kid  CHIEF COMPLAINT:   Abdominal pain HISTORY OF PRESENT ILLNESS:  Daxten Kovalenko  is a 77 y.o. male with a known history of CAD who presents today to the emergency room due to abdominal pain. Patient reports that since this morning he has had right upper quadrant abdominal pain. It is a 10 out of 10 when he presented to the emergency room. He does receive medications so he is not in pain currently. He was seen in the emergency room 2 days ago and diagnosed with pancreatitis.  Patient reports nausea and vomiting this morning.  CT scan of the abdomen is concerning CBD stones. GI consultant was contacted by ED physician who is recommending ERCP.  PAST MEDICAL HISTORY:   Past Medical History:  Diagnosis Date  . Anemia   . Coronary artery disease   . Glaucoma   . H/O ETOH abuse   . History of shingles   . Hyperlipidemia   . Hypertension     PAST SURGICAL HISTORY:   Past Surgical History:  Procedure Laterality Date  . COLONOSCOPY WITH PROPOFOL N/A 09/23/2014   Procedure: COLONOSCOPY WITH PROPOFOL;  Surgeon: Josefine Class, MD;  Location: [facility] ENDOSCOPY;  Service: Endoscopy;  Laterality: N/A;  . CORONARY ANGIOPLASTY    . CORONARY ARTERY BYPASS GRAFT    . EYE SURGERY      SOCIAL HISTORY:   Social History   Tobacco Use  . Smoking status: Former Research scientist (life sciences)  . Smokeless tobacco: Never Used  Substance Use Topics  . Alcohol use: No    Frequency: Never    FAMILY HISTORY:  No family history on file.  DRUG ALLERGIES:  No Known Allergies  REVIEW OF SYSTEMS:   Review of Systems  Constitutional: Negative.  Negative for chills, fever and malaise/fatigue.  HENT: Negative.  Negative for ear discharge, ear pain, hearing  loss, nosebleeds and sore throat.   Eyes: Negative.  Negative for blurred vision and pain.  Respiratory: Negative.  Negative for cough, hemoptysis, shortness of breath and wheezing.   Cardiovascular: Negative.  Negative for chest pain, palpitations and leg swelling.  Gastrointestinal: Positive for abdominal pain, nausea and vomiting. Negative for blood in stool and diarrhea.  Genitourinary: Negative.  Negative for dysuria.  Musculoskeletal: Negative.  Negative for back pain.  Skin: Negative.   Neurological: Negative for dizziness, tremors, speech change, focal weakness, seizures and headaches.  Endo/Heme/Allergies: Negative.  Does not bruise/bleed easily.  Psychiatric/Behavioral: Negative.  Negative for depression, hallucinations and suicidal ideas.    MEDICATIONS AT HOME:   Prior to Admission medications   Medication Sig Start Date End Date Taking? Authorizing Provider  amLODipine (NORVASC) 5 MG tablet Take 1 tablet by mouth daily. 02/01/17  Yes [provider]  aspirin EC 81 MG tablet Take 81 mg by mouth daily.   Yes [provider]  atorvastatin (LIPITOR) 80 MG tablet Take 80 mg by mouth daily.   Yes [provider]  clopidogrel (PLAVIX) 75 MG tablet Take 75 mg by mouth daily.   Yes [provider]  dorzolamide-timolol (COSOPT) 22.3-6.8 MG/ML ophthalmic solution Place 1 drop into both eyes 2 (two) times daily.   Yes [provider]  hydrochlorothiazide (MICROZIDE) 12.5 MG capsule Take 12.5 mg by  mouth at bedtime.    Yes [provider]  latanoprost (XALATAN) 0.005 % ophthalmic solution Place 1 drop into both eyes at bedtime.   Yes [provider]  levothyroxine (SYNTHROID, LEVOTHROID) 50 MCG tablet Take 1 tablet by mouth daily. 01/04/17  Yes [provider]  lisinopril (PRINIVIL,ZESTRIL) 40 MG tablet Take 40 mg by mouth daily.   Yes [provider]  metoprolol tartrate (LOPRESSOR) 25 MG tablet 12.5 mg 2 (two)  times daily.  11/29/16  Yes [provider]  vitamin B-12 (CYANOCOBALAMIN) 1000 MCG tablet Take 1,000 mcg by mouth daily.   Yes [provider]  cyclobenzaprine (FLEXERIL) 10 MG tablet TAKE 1 TABLET BY MOUTH NIGHTLY 10/09/16   [provider]  nitroGLYCERIN (NITROSTAT) 0.4 MG SL tablet Place 0.4 mg under the tongue every 5 (five) minutes as needed.     [provider]      VITAL SIGNS:  Blood pressure 126/60, pulse 86, temperature 97.8 F (36.6 C), temperature source Oral, resp. rate 19, height 5\' 7"  (1.702 m), weight 77.1 kg (170 lb), SpO2 97 %.  PHYSICAL EXAMINATION:   Physical Exam  Constitutional: He is oriented to person, place, and time and well-developed, well-nourished, and in no distress. No distress.  HENT:  Head: Normocephalic.  Eyes: No scleral icterus.  Neck: Normal range of motion. Neck supple. No JVD present. No tracheal deviation present.  Cardiovascular: Normal rate, regular rhythm and normal heart sounds. Exam reveals no gallop and no friction rub.  No murmur heard. Pulmonary/Chest: Effort normal and breath sounds normal. No respiratory distress. He has no wheezes. He has no rales. He exhibits no tenderness.  Abdominal: Soft. Bowel sounds are normal. He exhibits no distension and no mass. There is tenderness. There is no rebound and no guarding.  Musculoskeletal: Normal range of motion. He exhibits no edema.  Neurological: He is alert and oriented to person, place, and time.  Skin: Skin is warm. No rash noted. No erythema.  Psychiatric: Affect and judgment normal.      LABORATORY PANEL:   CBC Recent Labs  Lab 03/27/17 1105  WBC 14.9*  HGB 11.8*  HCT 35.2*  PLT 271   ------------------------------------------------------------------------------------------------------------------  Chemistries  Recent Labs  Lab 03/27/17 1105  NA 132*  K 4.5  CL 98*  CO2 24  GLUCOSE 108*  BUN 33*  CREATININE 1.86*  CALCIUM 9.4   AST 57*  ALT 69*  ALKPHOS 49  BILITOT 4.9*   ------------------------------------------------------------------------------------------------------------------  Cardiac Enzymes Recent Labs  Lab 03/25/17 1649  TROPONINI <0.03   ------------------------------------------------------------------------------------------------------------------  RADIOLOGY:  Ct Head Wo Contrast  Result Date: 03/25/2017 CLINICAL DATA:  Syncopal episode after taking 1 nitroglycerin. EXAM: CT HEAD WITHOUT CONTRAST TECHNIQUE: Contiguous axial images were obtained from the base of the skull through the vertex without intravenous contrast. COMPARISON:  12/12/2014 FINDINGS: Brain: Minimal chronic appearing small vessel ischemic disease of periventricular and subcortical white matter. Superficial atrophy is noted. No acute intracranial hemorrhage, midline shift or edema. No large vascular territory infarct. No intra-axial mass nor extra-axial fluid collections. Vascular: Atherosclerosis of the carotid siphons without hyperdense vessels. Skull: No suspicious osseous lesions or fracture. Sinuses/Orbits: Trace right mastoid effusion. Clear paranasal sinuses. Intact included orbits. Other: The left posterior parietal scalp contusion. IMPRESSION: 1. Left posterior parietal scalp contusion. 2. Minimal chronic small vessel ischemic disease and superficial atrophy. No acute intracranial abnormality. Electronically Signed   By: Ashley Royalty M.D.   On: 03/25/2017 17:20   Ct Abdomen Pelvis  W Contrast  Result Date: 03/25/2017 CLINICAL DATA:  Upper abdominal pain and chest pain. Elevated lipase. EXAM: CT ABDOMEN AND PELVIS WITH CONTRAST TECHNIQUE: Multidetector CT imaging of the abdomen and pelvis was performed using the standard protocol following bolus administration of intravenous contrast. CONTRAST:  42mL ISOVUE-300 IOPAMIDOL (ISOVUE-300) INJECTION 61% COMPARISON:  06/28/2016 FINDINGS: Lower chest: Small nodular densities at the  left lung base, largest measuring 4 mm on sequence 4, image 11. Findings are similar to the previous examination. No pleural effusions. Hepatobiliary: Mild periportal edema is nonspecific. No suspicious liver lesions. Mild distention of the gallbladder without inflammatory changes. The common bile duct is mildly dilated near the ampulla measuring up to 9 mm and previously measured 6 mm. No definite stones or obstructing lesion in the distal common bile duct. Pancreas: Normal appearance of the pancreas without inflammation or duct dilatation. Spleen: Normal appearance of spleen without enlargement. Adrenals/Urinary Tract: Normal adrenal glands. Normal appearance of both kidneys without hydronephrosis. Urinary bladder is unremarkable. Stomach/Bowel: Small hiatal hernia. Appendix is normal. Scattered colonic diverticula without acute inflammatory changes. No evidence for bowel obstruction. Rectum is distended with fluid and stool-like material. Vascular/Lymphatic: Again noted is the extensive atherosclerotic disease involving the distal descending thoracic aorta and the abdominal aorta. There flow in the celiac trunk and SMA. Origin of the IMA is probably occluded with distal reconstitution. Again noted is critical stenosis of the proximal right common iliac artery. No lymph node enlargement in the abdomen or pelvis. Reproductive: Prostate is unremarkable. Other: No free fluid.  Negative for free air. Musculoskeletal: No acute bone abnormality. IMPRESSION: No acute abnormality within the abdomen or pelvis. Slightly increased dilatation of the distal common bile duct is nonspecific. Recommend correlation with liver function tests. If there is clinical concern for choledocholithiasis, consider further characterization with MRCP. Severe atherosclerotic disease involving the aorta and the proximal right common iliac artery. Patient likely has hemodynamically significant stenosis involving the proximal right common iliac  artery. These findings are similar to the prior examination. Again noted are small nodules at the lung bases. No follow-up needed if patient is low-risk (and has no known or suspected primary neoplasm). Non-contrast chest CT can be considered in 12 months if patient is high-risk. This recommendation follows the consensus statement: Guidelines for Management of Incidental Pulmonary Nodules Detected on CT Images: From the Fleischner Society 2017; Radiology 2017; 284:228-243. Electronically Signed   By: Markus Daft M.D.   On: 03/25/2017 19:04   Dg Abdomen Acute W/chest  Result Date: 03/25/2017 CLINICAL DATA:  Abdominal pain. EXAM: DG ABDOMEN ACUTE W/ 1V CHEST COMPARISON:  CT 06/28/2016.  Chest x-ray report 06/13/2016. FINDINGS: Prior CABG. Heart size normal nipple shadow noted over the left lung base. No acute cardiopulmonary disease identified. Soft tissues the abdomen are unremarkable. No bowel distention. Scattered air-fluid levels noted in the colon. Diarrheal illness could present this fashion. No evidence of bowel obstruction or free air. Diffuse osteopenia and degenerative change. No acute bony abnormality. IMPRESSION: 1.  No acute cardiopulmonary disease.  Prior CABG. 2. Scattered air-fluid levels noted in the colon. Diarrheal illness could present this fashion. There is no evidence of bowel distention or free air. Electronically Signed   By: Marcello Moores  Register   On: 03/25/2017 17:30    EKG:  Normal sinus rhythm no ST elevation or depression  IMPRESSION AND PLAN:   77 year old male with history of essential hypertension and CAD who presents with abdominal pain.  1. Right upper abdominal pain with concern for  CBD stones with dilated ducts seen on CT scan  GI consultation requested Plan for ERCP in a.m. Follow liver function tests and abdominal exam Hold aspirin and Plavix until after ERCP Surgical consultation requested for possible cholecystectomy after ERCP   2. Acute pancreatitis: This was  diagnosed 2 days ago Continue IV fluids Continue supportive management with antiemetics and pain control  3. Hyponatremia, mild due to decreased by mouth intake Continue IV fluids and repeat BMP in a.m. Hold HCTZ  4. CAD: Hold aspirin and Plavix for now due to planned ERCP Continue lisinopril, metoprolol and statin  5 Essential hypertension: Continue metoprolol and lisinopril  6. Hypothyroid Continue Synthroid    All the records are reviewed and case discussed with ED provider. Management plans discussed with the patient and he is in agreement  CODE STATUS: Full  TOTAL TIME TAKING CARE OF THIS PATIENT: 42 minutes.    Dorsey Authement M.D on 03/27/2017 at 3:04 PM  Between 7am to 6pm - Pager - [phone]  After 6pm go to [email or web address] - password [email or web address]  Office  [phone]  CC: Primary care physician; Idelle Crouch, MD

## 2017-03-27 NOTE — Consult Note (Signed)
Jason House , MD [address], [place], [place], [place], [address] [address], [place], [place], [place], [address] Phone: [phone]  Fax: [phone]  Consultation  Referring Provider:  Dr Benjie Karvonen  Primary Care Physician:  Idelle Crouch, MD Primary Gastroenterologist: None last 2 years        Reason for Consultation:     Pancreatitis   Date of Admission:  03/27/2017 Date of Consultation:  03/27/2017         HPI:   GEROLD SAR is a 77 y.o. male presented to the ER today with abdominal pain in the RUQ , CT scan of the abdomen showed dilation of the distal part of the CBD.Lipase 6295, Tbilirubin 4.9.Hb 11.8 , MCV 93.   He was seen at the ER a few days back for a similar issue and had normal LFt's , elevated lipase and discharged from the ER. He is on plavix.    He says he has had pain all over his abdomen the past 5 days, no nausea or vomiting. Last ate last night .  Past Medical History:  Diagnosis Date  . Anemia   . Coronary artery disease   . Glaucoma   . H/O ETOH abuse   . History of shingles   . Hyperlipidemia   . Hypertension     Past Surgical History:  Procedure Laterality Date  . COLONOSCOPY WITH PROPOFOL N/A 09/23/2014   Procedure: COLONOSCOPY WITH PROPOFOL;  Surgeon: Josefine Class, MD;  Location: [facility] ENDOSCOPY;  Service: Endoscopy;  Laterality: N/A;  . CORONARY ANGIOPLASTY    . CORONARY ARTERY BYPASS GRAFT    . EYE SURGERY      Prior to Admission medications   Medication Sig Start Date End Date Taking? Authorizing Provider  amLODipine (NORVASC) 5 MG tablet Take 1 tablet by mouth daily. 02/01/17  Yes [provider]  aspirin EC 81 MG tablet Take 81 mg by mouth daily.   Yes [provider]  atorvastatin (LIPITOR) 80 MG tablet Take 80 mg by mouth daily.   Yes [provider]  clopidogrel (PLAVIX) 75 MG tablet Take 75 mg by mouth daily.   Yes [provider]  dorzolamide-timolol (COSOPT) 22.3-6.8 MG/ML  ophthalmic solution Place 1 drop into both eyes 2 (two) times daily.   Yes [provider]  hydrochlorothiazide (MICROZIDE) 12.5 MG capsule Take 12.5 mg by mouth at bedtime.    Yes [provider]  latanoprost (XALATAN) 0.005 % ophthalmic solution Place 1 drop into both eyes at bedtime.   Yes [provider]  levothyroxine (SYNTHROID, LEVOTHROID) 50 MCG tablet Take 1 tablet by mouth daily. 01/04/17  Yes [provider]  lisinopril (PRINIVIL,ZESTRIL) 40 MG tablet Take 40 mg by mouth daily.   Yes [provider]  metoprolol tartrate (LOPRESSOR) 25 MG tablet 12.5 mg 2 (two) times daily.  11/29/16  Yes [provider]  vitamin B-12 (CYANOCOBALAMIN) 1000 MCG tablet Take 1,000 mcg by mouth daily.   Yes [provider]  cyclobenzaprine (FLEXERIL) 10 MG tablet TAKE 1 TABLET BY MOUTH NIGHTLY 10/09/16   [provider]  nitroGLYCERIN (NITROSTAT) 0.4 MG SL tablet Place 0.4 mg under the tongue every 5 (five) minutes as needed.     [provider]    No family history on file.   Social History   Tobacco Use  . Smoking status: Former Research scientist (life sciences)  . Smokeless tobacco: Never Used  Substance Use Topics  . Alcohol use: No  Frequency: Never  . Drug use: No    Allergies as of 03/27/2017  . (No Known Allergies)    Review of Systems:    All systems reviewed and negative except where noted in HPI.   Physical Exam:  Vital signs in last 24 hours: Temp:  [97.8 F (36.6 C)] 97.8 F (36.6 C) (01/24 1109) Pulse Rate:  [80-86] 86 (01/24 1430) Resp:  [19-20] 19 (01/24 1430) BP: (126-155)/(52-63) 126/60 (01/24 1430) SpO2:  [97 %-98 %] 97 % (01/24 1430) Weight:  [170 lb (77.1 kg)] 170 lb (77.1 kg) (01/24 1050)   General:   Pleasant, cooperative in NAD Head:  Normocephalic and atraumatic. Eyes:   No icterus.   Conjunctiva pink. PERRLA. Ears:  Normal auditory acuity. Neck:  Supple; no masses or thyroidomegaly Lungs: Respirations  even and unlabored. Lungs clear to auscultation bilaterally.   No wheezes, crackles, or rhonchi.  Heart:  Regular rate and rhythm;  Without murmur, clicks, rubs or gallops Abdomen:  Soft, nondistended, mild generalized tenderness ,Normal bowel sounds. No appreciable masses or hepatomegaly.  No rebound or guarding.  Neurologic:  Alert and oriented x3;  grossly normal neurologically. Skin:  Intact without significant lesions or rashes. Cervical Nodes:  No significant cervical adenopathy. Psych:  Alert and cooperative. Normal affect.  LAB RESULTS: Recent Labs    03/25/17 1649 03/27/17 1105  WBC 14.4* 14.9*  HGB 11.8* 11.8*  HCT 35.3* 35.2*  PLT 256 271   BMET Recent Labs    03/25/17 1649 03/27/17 1105  NA 134* 132*  K 4.2 4.5  CL 101 98*  CO2 24 24  GLUCOSE 163* 108*  BUN 31* 33*  CREATININE 1.55* 1.86*  CALCIUM 8.8* 9.4   LFT Recent Labs    03/27/17 1105  PROT 7.5  ALBUMIN 4.5  AST 57*  ALT 69*  ALKPHOS 49  BILITOT 4.9*   PT/INR No results for input(s): LABPROT, INR in the last 72 hours.  STUDIES: Ct Head Wo Contrast  Result Date: 03/25/2017 CLINICAL DATA:  Syncopal episode after taking 1 nitroglycerin. EXAM: CT HEAD WITHOUT CONTRAST TECHNIQUE: Contiguous axial images were obtained from the base of the skull through the vertex without intravenous contrast. COMPARISON:  12/12/2014 FINDINGS: Brain: Minimal chronic appearing small vessel ischemic disease of periventricular and subcortical white matter. Superficial atrophy is noted. No acute intracranial hemorrhage, midline shift or edema. No large vascular territory infarct. No intra-axial mass nor extra-axial fluid collections. Vascular: Atherosclerosis of the carotid siphons without hyperdense vessels. Skull: No suspicious osseous lesions or fracture. Sinuses/Orbits: Trace right mastoid effusion. Clear paranasal sinuses. Intact included orbits. Other: The left posterior parietal scalp contusion. IMPRESSION: 1. Left  posterior parietal scalp contusion. 2. Minimal chronic small vessel ischemic disease and superficial atrophy. No acute intracranial abnormality. Electronically Signed   By: Ashley Royalty M.D.   On: 03/25/2017 17:20   Ct Abdomen Pelvis W Contrast  Result Date: 03/25/2017 CLINICAL DATA:  Upper abdominal pain and chest pain. Elevated lipase. EXAM: CT ABDOMEN AND PELVIS WITH CONTRAST TECHNIQUE: Multidetector CT imaging of the abdomen and pelvis was performed using the standard protocol following bolus administration of intravenous contrast. CONTRAST:  38mL ISOVUE-300 IOPAMIDOL (ISOVUE-300) INJECTION 61% COMPARISON:  06/28/2016 FINDINGS: Lower chest: Small nodular densities at the left lung base, largest measuring 4 mm on sequence 4, image 11. Findings are similar to the previous examination. No pleural effusions. Hepatobiliary: Mild periportal edema is nonspecific. No suspicious liver lesions. Mild distention of the gallbladder without inflammatory changes. The common  bile duct is mildly dilated near the ampulla measuring up to 9 mm and previously measured 6 mm. No definite stones or obstructing lesion in the distal common bile duct. Pancreas: Normal appearance of the pancreas without inflammation or duct dilatation. Spleen: Normal appearance of spleen without enlargement. Adrenals/Urinary Tract: Normal adrenal glands. Normal appearance of both kidneys without hydronephrosis. Urinary bladder is unremarkable. Stomach/Bowel: Small hiatal hernia. Appendix is normal. Scattered colonic diverticula without acute inflammatory changes. No evidence for bowel obstruction. Rectum is distended with fluid and stool-like material. Vascular/Lymphatic: Again noted is the extensive atherosclerotic disease involving the distal descending thoracic aorta and the abdominal aorta. There flow in the celiac trunk and SMA. Origin of the IMA is probably occluded with distal reconstitution. Again noted is critical stenosis of the proximal right  common iliac artery. No lymph node enlargement in the abdomen or pelvis. Reproductive: Prostate is unremarkable. Other: No free fluid.  Negative for free air. Musculoskeletal: No acute bone abnormality. IMPRESSION: No acute abnormality within the abdomen or pelvis. Slightly increased dilatation of the distal common bile duct is nonspecific. Recommend correlation with liver function tests. If there is clinical concern for choledocholithiasis, consider further characterization with MRCP. Severe atherosclerotic disease involving the aorta and the proximal right common iliac artery. Patient likely has hemodynamically significant stenosis involving the proximal right common iliac artery. These findings are similar to the prior examination. Again noted are small nodules at the lung bases. No follow-up needed if patient is low-risk (and has no known or suspected primary neoplasm). Non-contrast chest CT can be considered in 12 months if patient is high-risk. This recommendation follows the consensus statement: Guidelines for Management of Incidental Pulmonary Nodules Detected on CT Images: From the Fleischner Society 2017; Radiology 2017; 284:228-243. Electronically Signed   By: Markus Daft M.D.   On: 03/25/2017 19:04   Dg Abdomen Acute W/chest  Result Date: 03/25/2017 CLINICAL DATA:  Abdominal pain. EXAM: DG ABDOMEN ACUTE W/ 1V CHEST COMPARISON:  CT 06/28/2016.  Chest x-ray report 06/13/2016. FINDINGS: Prior CABG. Heart size normal nipple shadow noted over the left lung base. No acute cardiopulmonary disease identified. Soft tissues the abdomen are unremarkable. No bowel distention. Scattered air-fluid levels noted in the colon. Diarrheal illness could present this fashion. No evidence of bowel obstruction or free air. Diffuse osteopenia and degenerative change. No acute bony abnormality. IMPRESSION: 1.  No acute cardiopulmonary disease.  Prior CABG. 2. Scattered air-fluid levels noted in the colon. Diarrheal illness  could present this fashion. There is no evidence of bowel distention or free air. Electronically Signed   By: Marcello Moores  Register   On: 03/25/2017 17:30      Impression / Plan:   DJANGO NGUYEN is a 77 y.o. y/o male with likely gall stone pancreatitis. T bilirubin 4.9 , lipase elevated, Dilated distal part of the CBD on CT scan of the abdomen to 9 mm . Findings are suspicious for gall stone pancreatitis.   Plan   1. ERCP today  2. IV ringer lactate 3. Keep NPO 4. Surgery consult for cholecystectomy after ERCP  I have discussed alternative options, risks & benefits,  which include, but are not limited to, bleeding, infection, perforation,respiratory complication,pancreatitis  & drug reaction.  The patient agrees with this plan & written consent will be obtained.     Thank you for involving me in the care of this patient.      LOS: 0 days   Jason Bellows, MD  03/27/2017, 3:46 PM

## 2017-03-27 NOTE — Transfer of Care (Signed)
Immediate Anesthesia Transfer of Care Note  Patient: Jason House  Procedure(s) Performed: ENDOSCOPIC RETROGRADE CHOLANGIOPANCREATOGRAPHY (ERCP) WITH PROPOFOL (N/A )  Patient Location: PACU  Anesthesia Type:General  Level of Consciousness: awake and patient cooperative  Airway & Oxygen Therapy: Patient Spontanous Breathing and Patient connected to nasal cannula oxygen  Post-op Assessment: Report given to RN and Post -op Vital signs reviewed and stable  Post vital signs: Reviewed and stable  Last Vitals:  Vitals:   03/27/17 1600 03/27/17 1628  BP: 133/85 133/85  Pulse: 88 89  Resp:  18  Temp:    SpO2: 96% 96%    Last Pain:  Vitals:   03/27/17 1628  TempSrc:   PainSc: 5          Complications: No apparent anesthesia complications

## 2017-03-27 NOTE — H&P (Signed)
Lucilla Lame, MD [address] [place], [place] [address] Phone:[phone] Fax : [phone]  Primary Care Physician:  Idelle Crouch, MD Primary Gastroenterologist:  Dr. Allen Norris  Pre-Procedure History & Physical: HPI:  Jason House is a 77 y.o. male is here for an ERCP.   Past Medical History:  Diagnosis Date  . Anemia   . Coronary artery disease   . Glaucoma   . H/O ETOH abuse   . History of shingles   . Hyperlipidemia   . Hypertension     Past Surgical History:  Procedure Laterality Date  . COLONOSCOPY WITH PROPOFOL N/A 09/23/2014   Procedure: COLONOSCOPY WITH PROPOFOL;  Surgeon: Josefine Class, MD;  Location: [facility] ENDOSCOPY;  Service: Endoscopy;  Laterality: N/A;  . CORONARY ANGIOPLASTY    . CORONARY ARTERY BYPASS GRAFT    . EYE SURGERY      Prior to Admission medications   Medication Sig Start Date End Date Taking? Authorizing Provider  amLODipine (NORVASC) 5 MG tablet Take 1 tablet by mouth daily. 02/01/17  Yes [provider]  aspirin EC 81 MG tablet Take 81 mg by mouth daily.   Yes [provider]  atorvastatin (LIPITOR) 80 MG tablet Take 80 mg by mouth daily.   Yes [provider]  clopidogrel (PLAVIX) 75 MG tablet Take 75 mg by mouth daily.   Yes [provider]  dorzolamide-timolol (COSOPT) 22.3-6.8 MG/ML ophthalmic solution Place 1 drop into both eyes 2 (two) times daily.   Yes [provider]  hydrochlorothiazide (MICROZIDE) 12.5 MG capsule Take 12.5 mg by mouth at bedtime.    Yes [provider]  latanoprost (XALATAN) 0.005 % ophthalmic solution Place 1 drop into both eyes at bedtime.   Yes [provider]  levothyroxine (SYNTHROID, LEVOTHROID) 50 MCG tablet Take 1 tablet by mouth daily. 01/04/17  Yes [provider]  lisinopril (PRINIVIL,ZESTRIL) 40 MG tablet Take 40 mg by mouth daily.   Yes [provider]  metoprolol tartrate (LOPRESSOR) 25 MG tablet 12.5  mg 2 (two) times daily.  11/29/16  Yes [provider]  vitamin B-12 (CYANOCOBALAMIN) 1000 MCG tablet Take 1,000 mcg by mouth daily.   Yes [provider]  cyclobenzaprine (FLEXERIL) 10 MG tablet TAKE 1 TABLET BY MOUTH NIGHTLY 10/09/16   [provider]  nitroGLYCERIN (NITROSTAT) 0.4 MG SL tablet Place 0.4 mg under the tongue every 5 (five) minutes as needed.     [provider]    Allergies as of 03/27/2017  . (No Known Allergies)    History reviewed. No pertinent family history.  Social History   Socioeconomic History  . Marital status: Married    Spouse name: Not on file  . Number of children: Not on file  . Years of education: Not on file  . Highest education level: Not on file  Social Needs  . Financial resource strain: Not on file  . Food insecurity - worry: Not on file  . Food insecurity - inability: Not on file  . Transportation needs - medical: Not on file  . Transportation needs - non-medical: Not on file  Occupational History  . Not on file  Tobacco Use  . Smoking status: Former Research scientist (life sciences)  . Smokeless tobacco: Never Used  Substance and Sexual Activity  . Alcohol use: No    Frequency: Never  . Drug use: No  . Sexual activity: Not on file  Other Topics Concern  . Not on file  Social  History Narrative  . Not on file    Review of Systems: See HPI, otherwise negative ROS  Physical Exam: BP 133/85 (BP Location: Right Arm)   Pulse 89   Temp 97.8 F (36.6 C) (Oral)   Resp 18   Ht 5\' 7"  (1.702 m)   Wt 170 lb (77.1 kg)   SpO2 96%   BMI 26.63 kg/m  General:   Alert,  pleasant and cooperative in NAD Head:  Normocephalic and atraumatic. Neck:  Supple; no masses or thyromegaly. Lungs:  Clear throughout to auscultation.    Heart:  Regular rate and rhythm. Abdomen:  Soft, nontender and nondistended. Normal bowel sounds, without guarding, and without rebound.   Neurologic:  Alert and  oriented x4;  grossly normal  neurologically.  Impression/Plan: Jason House is here for an ERCP to be performed for jaundice  Risks, benefits, limitations, and alternatives regarding  ERCP have been reviewed with the patient.  Questions have been answered.  All parties agreeable.   Lucilla Lame, MD  03/27/2017, 6:22 PM

## 2017-03-28 LAB — COMPREHENSIVE METABOLIC PANEL
ALT: 45 U/L (ref 17–63)
ANION GAP: 9 (ref 5–15)
AST: 37 U/L (ref 15–41)
Albumin: 3.3 g/dL — ABNORMAL LOW (ref 3.5–5.0)
Alkaline Phosphatase: 39 U/L (ref 38–126)
BUN: 26 mg/dL — ABNORMAL HIGH (ref 6–20)
CHLORIDE: 106 mmol/L (ref 101–111)
CO2: 20 mmol/L — ABNORMAL LOW (ref 22–32)
CREATININE: 1.57 mg/dL — AB (ref 0.61–1.24)
Calcium: 8.2 mg/dL — ABNORMAL LOW (ref 8.9–10.3)
GFR, EST AFRICAN AMERICAN: 47 mL/min — AB (ref >60)
GFR, EST NON AFRICAN AMERICAN: 41 mL/min — AB (ref >60)
Glucose, Bld: 71 mg/dL (ref 65–99)
POTASSIUM: 4 mmol/L (ref 3.5–5.1)
Sodium: 135 mmol/L (ref 135–145)
Total Bilirubin: 2.3 mg/dL — ABNORMAL HIGH (ref 0.3–1.2)
Total Protein: 5.9 g/dL — ABNORMAL LOW (ref 6.5–8.1)

## 2017-03-28 LAB — LIPASE, BLOOD: LIPASE: 1094 U/L — AB (ref 11–51)

## 2017-03-28 MED ORDER — CLOPIDOGREL BISULFATE 75 MG PO TABS: 75.0000 mg | ORAL_TABLET | Freq: Every day | ORAL | Status: DC

## 2017-03-28 MED ORDER — ASPIRIN EC 81 MG PO TBEC: 81.0000 mg | DELAYED_RELEASE_TABLET | Freq: Every day | ORAL | Status: DC

## 2017-03-28 NOTE — Consult Note (Signed)
Date of Consultation:  03/28/2017  Requesting Physician:  Bettey Costa, MD  Reason for Consultation: Gallstone pancreatitis  History of Present Illness: Jason House is a 77 y.o. male who was admitted yesterday with choledocholithiasis and had an ERCP done yesterday by Dr. Allen Norris.  The patient does take Plavix and so yesterday during the ERCP only a stent was placed without any retrieval of stones or sphincterotomy.  Yesterday on admission, he had a lipase of 6295 which has come down today to 1094, a total bilirubin of 4.9 which now is 2.3.  He was seen in the hospital initially on 1/22 and was found to have pancreatitis with normal LFTs at that time.  He was offered admission but the patient chose to go home instead and was discharged home from the emergency room.  However he returned yesterday because he was having worsening abdominal pain also had an episode of emesis at home prior to admission.  Of note, his last dose of Plavix was yesterday morning prior to coming to the emergency room.  Past Medical History: Past Medical History:  Diagnosis Date  . Anemia   . Coronary artery disease   . Glaucoma   . H/O ETOH abuse   . History of shingles   . Hyperlipidemia   . Hypertension      Past Surgical History: Past Surgical History:  Procedure Laterality Date  . COLONOSCOPY WITH PROPOFOL N/A 09/23/2014   Procedure: COLONOSCOPY WITH PROPOFOL;  Surgeon: Josefine Class, MD;  Location: [facility] ENDOSCOPY;  Service: Endoscopy;  Laterality: N/A;  . CORONARY ANGIOPLASTY    . CORONARY ARTERY BYPASS GRAFT    . EYE SURGERY      Home Medications: Prior to Admission medications   Medication Sig Start Date End Date Taking? Authorizing Provider  amLODipine (NORVASC) 5 MG tablet Take 1 tablet by mouth daily. 02/01/17  Yes [provider]  aspirin EC 81 MG tablet Take 81 mg by mouth daily.   Yes [provider]  atorvastatin (LIPITOR) 80 MG tablet Take 80 mg by mouth daily.   Yes  [provider]  clopidogrel (PLAVIX) 75 MG tablet Take 75 mg by mouth daily.   Yes [provider]  dorzolamide-timolol (COSOPT) 22.3-6.8 MG/ML ophthalmic solution Place 1 drop into both eyes 2 (two) times daily.   Yes [provider]  hydrochlorothiazide (MICROZIDE) 12.5 MG capsule Take 12.5 mg by mouth at bedtime.    Yes [provider]  latanoprost (XALATAN) 0.005 % ophthalmic solution Place 1 drop into both eyes at bedtime.   Yes [provider]  levothyroxine (SYNTHROID, LEVOTHROID) 50 MCG tablet Take 1 tablet by mouth daily. 01/04/17  Yes [provider]  lisinopril (PRINIVIL,ZESTRIL) 40 MG tablet Take 40 mg by mouth daily.   Yes [provider]  metoprolol tartrate (LOPRESSOR) 25 MG tablet 12.5 mg 2 (two) times daily.  11/29/16  Yes [provider]  vitamin B-12 (CYANOCOBALAMIN) 1000 MCG tablet Take 1,000 mcg by mouth daily.   Yes [provider]  cyclobenzaprine (FLEXERIL) 10 MG tablet TAKE 1 TABLET BY MOUTH NIGHTLY 10/09/16   [provider]  nitroGLYCERIN (NITROSTAT) 0.4 MG SL tablet Place 0.4 mg under the tongue every 5 (five) minutes as needed.     [provider]    Allergies: No Known Allergies  Social History:  reports that he has quit smoking. he has never used smokeless tobacco. He reports that he does not drink alcohol or use drugs.  Family History: There is history of heart attack on his mother as well as diabetes.  There is kidney cancer in a sister and throat cancer in a brother..  Review of Systems: Review of Systems  Constitutional: Negative for chills and fever.  HENT: Negative for hearing loss.   Respiratory: Negative for cough.   Cardiovascular: Negative for chest pain.  Gastrointestinal: Positive for abdominal pain, nausea and vomiting. Negative for constipation.  Genitourinary: Negative for dysuria.  Musculoskeletal: Negative for myalgias.  Neurological: Negative  for dizziness.  Psychiatric/Behavioral: Negative for depression.  All other systems reviewed and are negative.   Physical Exam BP (!) 135/49 (BP Location: Left Arm)   Pulse 98   Temp 98.3 F (36.8 C) (Oral)   Resp 17   Ht 5\' 7"  (1.702 m)   Wt 67.1 kg (147 lb 14.9 oz) Comment: standard linen and two pillows  SpO2 96%   BMI 23.17 kg/m  CONSTITUTIONAL: No acute distress HEENT:  Normocephalic, atraumatic, extraocular motion intact. NECK: Trachea is midline, and there is no jugular venous distension. RESPIRATORY:  Lungs are clear, and breath sounds are equal bilaterally. Normal respiratory effort without pathologic use of accessory muscles. CARDIOVASCULAR: Heart is regular without murmurs, gallops, or rubs. GI: The abdomen is soft, mildly distended, with tenderness to palpation in the epigastric and right upper quadrant area.  Negative Murphy sign.  MUSCULOSKELETAL:  Normal muscle strength and tone in all four extremities.  No peripheral edema or cyanosis. SKIN: Skin turgor is normal. There are no pathologic skin lesions.  NEUROLOGIC:  Motor and sensation is grossly normal.  Cranial nerves are grossly intact. PSYCH:  Alert and oriented to person, place and time. Affect is normal.  Laboratory Analysis: Results for orders placed or performed during the hospital encounter of 03/27/17 (from the past 24 hour(s))  Lipase, blood     Status: Abnormal   Collection Time: 03/28/17  4:28 AM  Result Value Ref Range   Lipase 1,094 (H) 11 - 51 U/L  Comprehensive metabolic panel     Status: Abnormal   Collection Time: 03/28/17  4:28 AM  Result Value Ref Range   Sodium 135 135 - 145 mmol/L   Potassium 4.0 3.5 - 5.1 mmol/L   Chloride 106 101 - 111 mmol/L   CO2 20 (L) 22 - 32 mmol/L   Glucose, Bld 71 65 - 99 mg/dL   BUN 26 (H) 6 - 20 mg/dL   Creatinine, Ser 1.57 (H) 0.61 - 1.24 mg/dL   Calcium 8.2 (L) 8.9 - 10.3 mg/dL   Total Protein 5.9 (L) 6.5 - 8.1 g/dL   Albumin 3.3 (L) 3.5 - 5.0 g/dL   AST  37 15 - 41 U/L   ALT 45 17 - 63 U/L   Alkaline Phosphatase 39 38 - 126 U/L   Total Bilirubin 2.3 (H) 0.3 - 1.2 mg/dL   GFR calc non Af Amer 41 (L) >60 mL/min   GFR calc Af Amer 47 (L) >60 mL/min   Anion gap 9 5 - 15    Imaging: Dg C-arm 1-60 Min-no Report  Result Date: 03/27/2017 Fluoroscopy was utilized by the requesting physician.  No radiographic interpretation.    Assessment and Plan: This is a 77 y.o. male who presents with gallstone pancreatitis.  -At this point the patient still having significant pain although it has improved compared to yesterday.  His lipase is still elevated his LFTs are just coming down.  I have independently reviewed the patient's prior imaging studies  and reviewed his laboratory studies. -Given that his last dose of Plavix was yesterday, the earliest that we could safely do his surgery would be on Tuesday 1/29.  There is currently no emergency to do his gallbladder surgery.  If the patient is still in the hospital at that point we could potentially do his surgery then.  Otherwise, he may be discharged to home and we would plan for elective outpatient cholecystectomy.  Depending on how quickly his symptoms resolve, surgery may be done relatively sooner versus in a few weeks to allow for more of the inflammation to calm down. -Patient understands this plan and all of his questions have been answered.   Melvyn Neth, [place]

## 2017-03-28 NOTE — Progress Notes (Signed)
[facility] [place] NAME: Jason House    MR#:  [id number]  DATE OF BIRTH:  04-26-1940  SUBJECTIVE:  CHIEF COMPLAINT:   Chief Complaint  Patient presents with  . Abdominal Pain   Came with pancreatitis and her pain, had a gallbladder and bile duct stone, ERCP done. Pain is much better now. Tolerating liquid diet. REVIEW OF SYSTEMS:  CONSTITUTIONAL: No fever, fatigue or weakness.  EYES: No blurred or double vision.  EARS, NOSE, AND THROAT: No tinnitus or ear pain.  RESPIRATORY: No cough, shortness of breath, wheezing or hemoptysis.  CARDIOVASCULAR: No chest pain, orthopnea, edema.  GASTROINTESTINAL: No nausea, vomiting, diarrhea or abdominal pain.  GENITOURINARY: No dysuria, hematuria.  ENDOCRINE: No polyuria, nocturia,  HEMATOLOGY: No anemia, easy bruising or bleeding SKIN: No rash or lesion. MUSCULOSKELETAL: No joint pain or arthritis.   NEUROLOGIC: No tingling, numbness, weakness.  PSYCHIATRY: No anxiety or depression.   ROS  DRUG ALLERGIES:  No Known Allergies  VITALS:  Blood pressure (!) 139/56, pulse 82, temperature 97.6 F (36.4 C), temperature source Oral, resp. rate 18, height 5\' 7"  (1.702 m), weight 67.1 kg (147 lb 14.9 oz), SpO2 97 %.  PHYSICAL EXAMINATION:  GENERAL:  77 y.o.-year-old patient lying in the bed with no acute distress.  EYES: Pupils equal, round, reactive to light and accommodation. No scleral icterus. Extraocular muscles intact.  HEENT: Head atraumatic, normocephalic. Oropharynx and nasopharynx clear.  NECK:  Supple, no jugular venous distention. No thyroid enlargement, no tenderness.  LUNGS: Normal breath sounds bilaterally, no wheezing, rales,rhonchi or crepitation. No use of accessory muscles of respiration.  CARDIOVASCULAR: S1, S2 normal. No murmurs, rubs, or gallops.  ABDOMEN: Soft, mild tender, nondistended. Bowel sounds present. No organomegaly or mass.  EXTREMITIES: No pedal edema, cyanosis, or  clubbing.  NEUROLOGIC: Cranial nerves II through XII are intact. Muscle strength 5/5 in all extremities. Sensation intact. Gait not checked.  PSYCHIATRIC: The patient is alert and oriented x 3.  SKIN: No obvious rash, lesion, or ulcer.   Physical Exam LABORATORY PANEL:   CBC Recent Labs  Lab 03/27/17 1105  WBC 14.9*  HGB 11.8*  HCT 35.2*  PLT 271   ------------------------------------------------------------------------------------------------------------------  Chemistries  Recent Labs  Lab 03/28/17 0428  NA 135  K 4.0  CL 106  CO2 20*  GLUCOSE 71  BUN 26*  CREATININE 1.57*  CALCIUM 8.2*  AST 37  ALT 45  ALKPHOS 39  BILITOT 2.3*   ------------------------------------------------------------------------------------------------------------------  Cardiac Enzymes Recent Labs  Lab 03/25/17 1649  TROPONINI <0.03   ------------------------------------------------------------------------------------------------------------------  RADIOLOGY:  Dg C-arm 1-60 Min-no Report  Result Date: 03/27/2017 Fluoroscopy was utilized by the requesting physician.  No radiographic interpretation.    ASSESSMENT AND PLAN:   Active Problems:   Choledocholithiasis   Jaundice   Acute pancreatitis   1. Right upper abdominal pain with concern for CBD stones with dilated ducts seen on CT scan  GI consultation - s/p ERCP Follow liver function tests and abdominal exam Hold aspirin and Plavix - resume tomorrow Surgical consultation requested for possible cholecystectomy after ERCP Surgery want Plavix off for 5 days, once patient is stable we can discharge and he can follow as outpatient with surgical clinic.  2. Acute pancreatitis: This was diagnosed 2 days ago Continue IV fluids Continue supportive management with antiemetics and pain control  3. Hyponatremia, mild due to decreased by mouth intake Continue IV fluids and repeat BMP in a.m. Hold HCTZ  4.  CAD: Hold aspirin  and Plavix for now due to planned ERCP Continue lisinopril, metoprolol and statin  Resume tomorrow.  5 Essential hypertension: Continue metoprolol and lisinopril  6. Hypothyroid Continue Synthroid   All the records are reviewed and case discussed with Care Management/Social Workerr. Management plans discussed with the patient, family and they are in agreement.  CODE STATUS: Full.  TOTAL TIME TAKING CARE OF THIS PATIENT: 35 minutes.     POSSIBLE D/C IN 1-2 DAYS, DEPENDING ON CLINICAL CONDITION.   Vaughan Basta M.D on 03/28/2017   Between 7am to 6pm - Pager - [phone]  After 6pm go to [email or web address] - password [email or web address] [facility]  Office  [phone]  CC: Primary care physician; Idelle Crouch, MD  Note: This dictation was prepared with Dragon dictation along with smaller phrase technology. Any transcriptional errors that result from this process are unintentional.

## 2017-03-28 NOTE — Progress Notes (Signed)
   Jason House , MD [address] [place], [place], [place], [address] [address], [place], [place], [place], [address] Phone: [phone]  Fax: [place] is being followed for gall stone pancreatitis  Day 1 of follow up   Subjective:  Feels better, less abdominal pain   Objective: Vital signs in last 24 hours: Vitals:   03/28/17 0451 03/28/17 0945 03/28/17 0947 03/28/17 1223  BP: (!) 139/48 (!) 135/49 (!) 135/49 (!) 139/56  Pulse: (!) 104  98 82  Resp:   17 18  Temp: 98.3 F (36.8 C)  98.3 F (36.8 C) 97.6 F (36.4 C)  TempSrc: Oral  Oral Oral  SpO2: 96%  96% 97%  Weight: 147 lb 14.9 oz (67.1 kg)     Height:       Weight change:   Intake/Output Summary (Last 24 hours) at 03/28/2017 1539 Last data filed at 03/28/2017 1430 Gross per 24 hour  Intake 4810 ml  Output 850 ml  Net 3960 ml     Exam: Heart:: Regular rate and rhythm, S1S2 present or without murmur or extra heart sounds Lungs: normal, clear to auscultation and clear to auscultation and percussion Abdomen: soft, mild epigastric tenderness, normal bowel sounds   Lab Results: @LABTEST2 @ Micro Results: No results found for this or any previous visit (from the past 240 hour(s)). Studies/Results: Dg C-arm 1-60 Min-no Report  Result Date: 03/27/2017 Fluoroscopy was utilized by the requesting physician.  No radiographic interpretation.   Medications: I have reviewed the patient's current medications. Scheduled Meds: . amLODipine  5 mg Oral Daily  . atorvastatin  80 mg Oral Daily  . cyclobenzaprine  10 mg Oral QHS  . dorzolamide-timolol  1 drop Both Eyes BID  . enoxaparin (LOVENOX) injection  40 mg Subcutaneous Q24H  . indomethacin  100 mg Rectal Once  . latanoprost  1 drop Both Eyes QHS  . levothyroxine  50 mcg Oral QAC breakfast  . lisinopril  40 mg Oral Daily  . metoprolol tartrate  12.5 mg Oral BID  . vitamin B-12  1,000 mcg Oral Daily   Continuous Infusions: . sodium  chloride 100 mL/hr at 03/28/17 1430   PRN Meds:.acetaminophen **OR** acetaminophen, HYDROmorphone (DILAUDID) injection, ondansetron **OR** ondansetron (ZOFRAN) IV, polyethylene glycol   Assessment:  Jason House is a 77 y.o. y/o male likely gall stone pancreatitis, S/p ERCP 03/27/17 for cholidocholithiasis with placement of stent, stone not extracted as he was on Plavix Doing well , Tbilirubin decreasing   Plan: 1. Advance diet as tolerated 2. Cholecystectomy timing per surgery.  3. ERCP in 1 month to clear bile duct and remove stent.   I will sign off.  Please call me if any further GI concerns or questions.  We would like to thank you for the opportunity to participate in the care of Jason House.   LOS: 1 day   Jason Bellows, MD 03/28/2017, 3:39 PM

## 2017-03-29 LAB — COMPREHENSIVE METABOLIC PANEL
ALT: 137 U/L — ABNORMAL HIGH (ref 17–63)
ANION GAP: 8 (ref 5–15)
AST: 165 U/L — AB (ref 15–41)
Albumin: 3 g/dL — ABNORMAL LOW (ref 3.5–5.0)
Alkaline Phosphatase: 96 U/L (ref 38–126)
BUN: 19 mg/dL (ref 6–20)
CHLORIDE: 100 mmol/L — AB (ref 101–111)
CO2: 22 mmol/L (ref 22–32)
Calcium: 8.3 mg/dL — ABNORMAL LOW (ref 8.9–10.3)
Creatinine, Ser: 1.33 mg/dL — ABNORMAL HIGH (ref 0.61–1.24)
GFR, EST AFRICAN AMERICAN: 58 mL/min — AB (ref >60)
GFR, EST NON AFRICAN AMERICAN: 50 mL/min — AB (ref >60)
Glucose, Bld: 101 mg/dL — ABNORMAL HIGH (ref 65–99)
POTASSIUM: 4.1 mmol/L (ref 3.5–5.1)
Sodium: 130 mmol/L — ABNORMAL LOW (ref 135–145)
TOTAL PROTEIN: 6.1 g/dL — AB (ref 6.5–8.1)
Total Bilirubin: 4.9 mg/dL — ABNORMAL HIGH (ref 0.3–1.2)

## 2017-03-29 LAB — CBC WITH DIFFERENTIAL/PLATELET
BASOS ABS: 0 10*3/uL (ref 0–0.1)
Basophils Relative: 0 %
EOS PCT: 0 %
Eosinophils Absolute: 0 10*3/uL (ref 0–0.7)
HCT: 30.7 % — ABNORMAL LOW (ref 40.0–52.0)
Hemoglobin: 10.6 g/dL — ABNORMAL LOW (ref 13.0–18.0)
LYMPHS PCT: 16 %
Lymphs Abs: 1.7 10*3/uL (ref 1.0–3.6)
MCH: 31.8 pg (ref 26.0–34.0)
MCHC: 34.6 g/dL (ref 32.0–36.0)
MCV: 91.9 fL (ref 80.0–100.0)
MONO ABS: 1.3 10*3/uL — AB (ref 0.2–1.0)
MONOS PCT: 12 %
Neutro Abs: 7.9 10*3/uL — ABNORMAL HIGH (ref 1.4–6.5)
Neutrophils Relative %: 72 %
PLATELETS: 192 10*3/uL (ref 150–440)
RBC: 3.34 MIL/uL — ABNORMAL LOW (ref 4.40–5.90)
RDW: 13.1 % (ref 11.5–14.5)
WBC: 10.9 10*3/uL — ABNORMAL HIGH (ref 3.8–10.6)

## 2017-03-29 LAB — LIPASE, BLOOD: LIPASE: 113 U/L — AB (ref 11–51)

## 2017-03-29 MED ORDER — POLYETHYLENE GLYCOL 3350 17 G PO PACK
17.0000 g | PACK | Freq: Every day | ORAL | Status: DC
  Administered 2017-03-29: 17 g via ORAL
  Filled 2017-03-29 (×2): qty 1

## 2017-03-29 MED ORDER — SODIUM CHLORIDE 0.9 % IV SOLN
INTRAVENOUS | Status: AC
  Administered 2017-03-29 (×2): via INTRAVENOUS

## 2017-03-29 NOTE — Discharge Instructions (Signed)
Low fat diet. Follow up surgeon as outpatient.

## 2017-03-29 NOTE — Progress Notes (Signed)
03/29/2017  Subjective: Patient reports still having pain and soreness in his abdomen.  He is also distended.  His LFTs worsened today with total bilirubin increased to 4.9 from 2.3 yesterday, with increasing transaminases.  His lipase however has improved to 113.  Vital signs: Temp:  [99.5 F (37.5 C)-100.4 F (38 C)] 99.5 F (37.5 C) (01/26 0433) Pulse Rate:  [86-92] 92 (01/26 0433) Resp:  [20] 20 (01/26 0433) BP: (131-133)/(47-56) 133/47 (01/26 0433) SpO2:  [95 %] 95 % (01/26 0433)   Intake/Output: 01/25 0701 - 01/26 0700 In: 6015 [P.O.:1080; I.V.:2076] Out: 825 [Urine:825] Last BM Date: 03/26/17  Physical Exam: Constitutional: No acute distress Abdomen:  Soft, distended, with mild soreness to palpation over upper abdomen.   Labs:  Recent Labs    03/27/17 1105 03/29/17 0759  WBC 14.9* 10.9*  HGB 11.8* 10.6*  HCT 35.2* 30.7*  PLT 271 192   Recent Labs    03/28/17 0428 03/29/17 0759  NA 135 130*  K 4.0 4.1  CL 106 100*  CO2 20* 22  GLUCOSE 71 101*  BUN 26* 19  CREATININE 1.57* 1.33*  CALCIUM 8.2* 8.3*   No results for input(s): LABPROT, INR in the last 72 hours.  Imaging: No results found.  Assessment/Plan: 77 yo male with gallstone pancreatitis.  --LFTs worsened today.  Would recommend repeat LFTs in AM and re-consulting GI as well. --Hold ASA / Plavix in case he need any other GI procedures.  In light of his abdominal pain not improving quickly, most likely there will be more inflammation intra-abdominally that would be better to wait a few weeks before doing a cholecystectomy. --Due to his pain, have backed the patient's diet to clear liquids only.  If pain continues or worsens, would recommend NPO with ice chips. --Patient has not had a bowel movement since admission.  Will also add MiraLax daily.   Melvyn Neth, [place]

## 2017-03-29 NOTE — Progress Notes (Signed)
[facility] [place] NAME: Jason House    MR#:  [id number]  DATE OF BIRTH:  1940-12-19  SUBJECTIVE:  CHIEF COMPLAINT:   Chief Complaint  Patient presents with  . Abdominal Pain  still has abd pain. REVIEW OF SYSTEMS:  CONSTITUTIONAL: No fever, fatigue or weakness.  EYES: No blurred or double vision.  EARS, NOSE, AND THROAT: No tinnitus or ear pain.  RESPIRATORY: No cough, shortness of breath, wheezing or hemoptysis.  CARDIOVASCULAR: No chest pain, orthopnea, edema.  GASTROINTESTINAL: No nausea, vomiting, diarrhea but has abdominal pain.  GENITOURINARY: No dysuria, hematuria.  ENDOCRINE: No polyuria, nocturia,  HEMATOLOGY: No anemia, easy bruising or bleeding SKIN: No rash or lesion. MUSCULOSKELETAL: No joint pain or arthritis.   NEUROLOGIC: No tingling, numbness, weakness.  PSYCHIATRY: No anxiety or depression.   ROS  DRUG ALLERGIES:  No Known Allergies  VITALS:  Blood pressure (!) 142/53, pulse 82, temperature 99 F (37.2 C), temperature source Oral, resp. rate 18, height 5\' 7"  (1.702 m), weight 147 lb 14.9 oz (67.1 kg), SpO2 97 %.  PHYSICAL EXAMINATION:  GENERAL:  77 y.o.-year-old patient lying in the bed with no acute distress.  EYES: Pupils equal, round, reactive to light and accommodation. No scleral icterus. Extraocular muscles intact.  HEENT: Head atraumatic, normocephalic. Oropharynx and nasopharynx clear.  NECK:  Supple, no jugular venous distention. No thyroid enlargement, no tenderness.  LUNGS: Normal breath sounds bilaterally, no wheezing, rales,rhonchi or crepitation. No use of accessory muscles of respiration.  CARDIOVASCULAR: S1, S2 normal. No murmurs, rubs, or gallops.  ABDOMEN: Soft, mild tenderness, nondistended. Bowel sounds present. No organomegaly or mass.  EXTREMITIES: No pedal edema, cyanosis, or clubbing.  NEUROLOGIC: Cranial nerves II through XII are intact. Muscle strength 5/5 in all extremities.  Sensation intact. Gait not checked.  PSYCHIATRIC: The patient is alert and oriented x 3.  SKIN: No obvious rash, lesion, or ulcer.   Physical Exam LABORATORY PANEL:   CBC Recent Labs  Lab 03/29/17 0759  WBC 10.9*  HGB 10.6*  HCT 30.7*  PLT 192   ------------------------------------------------------------------------------------------------------------------  Chemistries  Recent Labs  Lab 03/29/17 0759  NA 130*  K 4.1  CL 100*  CO2 22  GLUCOSE 101*  BUN 19  CREATININE 1.33*  CALCIUM 8.3*  AST 165*  ALT 137*  ALKPHOS 96  BILITOT 4.9*   ------------------------------------------------------------------------------------------------------------------  Cardiac Enzymes Recent Labs  Lab 03/25/17 1649  TROPONINI <0.03   ------------------------------------------------------------------------------------------------------------------  RADIOLOGY:  Dg C-arm 1-60 Min-no Report  Result Date: 03/27/2017 Fluoroscopy was utilized by the requesting physician.  No radiographic interpretation.    ASSESSMENT AND PLAN:   Active Problems:   Choledocholithiasis   Jaundice   Acute pancreatitis   1. Right upper abdominal pain with concern for CBD stones with dilated ducts seen on CT scan  GI consultation - s/p ERCP Liver function tests improving but worse today.  Follow-up CMP. Hold aspirin and Plavix for possible sugery. Surgical consultation requested for possible cholecystectomy after ERCP Surgery want Plavix off for 5 days, once patient is stable we can discharge and he can follow as outpatient with surgical clinic. Lipase is down to 113. Still keep clear liquid diet, hold aspirin and Plavix for possible surgery per Dr. Hampton Abbot.  2. Acute pancreatitits. Continue IV fluids Continue supportive management with antiemetics and pain control  3. Hyponatremia, mild due to decreased by mouth intake Continue IV fluids and repeat BMP in a.m. Hold HCTZ  4. CAD: Hold  aspirin and Plavix Continue lisinopril, metoprolol and statin.  5 Essential hypertension: Continue metoprolol and lisinopril  6. Hypothyroid Continue Synthroid  Discussed with Dr. Hampton Abbot. All the records are reviewed and case discussed with Care Management/Social Workerr. Management plans discussed with the patient, family and they are in agreement.  CODE STATUS: Full.  TOTAL TIME TAKING CARE OF THIS PATIENT: 38 minutes.  POSSIBLE D/C IN 2 DAYS, DEPENDING ON CLINICAL CONDITION.   Demetrios Loll M.D on 03/29/2017   Between 7am to 6pm - Pager - [phone]  After 6pm go to [email or web address] - password [email or web address]  Office  [phone]  CC: Primary care physician; Idelle Crouch, MD  Note: This dictation was prepared with Dragon dictation along with smaller phrase technology. Any transcriptional errors that result from this process are unintentional.

## 2017-03-30 ENCOUNTER — Encounter: Payer: Self-pay | Admitting: Radiology

## 2017-03-30 ENCOUNTER — Inpatient Hospital Stay: Payer: PPO

## 2017-03-30 LAB — BLOOD CULTURE ID PANEL (REFLEXED)
Acinetobacter baumannii: NOT DETECTED
CANDIDA PARAPSILOSIS: NOT DETECTED
CARBAPENEM RESISTANCE: NOT DETECTED
Candida albicans: NOT DETECTED
Candida glabrata: NOT DETECTED
Candida krusei: NOT DETECTED
Candida tropicalis: NOT DETECTED
ENTEROBACTERIACEAE SPECIES: DETECTED — AB
ENTEROCOCCUS SPECIES: NOT DETECTED
Enterobacter cloacae complex: NOT DETECTED
Escherichia coli: NOT DETECTED
HAEMOPHILUS INFLUENZAE: NOT DETECTED
KLEBSIELLA PNEUMONIAE: NOT DETECTED
Klebsiella oxytoca: NOT DETECTED
Listeria monocytogenes: NOT DETECTED
METHICILLIN RESISTANCE: NOT DETECTED
Neisseria meningitidis: NOT DETECTED
PSEUDOMONAS AERUGINOSA: NOT DETECTED
Proteus species: NOT DETECTED
STAPHYLOCOCCUS AUREUS BCID: NOT DETECTED
STAPHYLOCOCCUS SPECIES: NOT DETECTED
STREPTOCOCCUS PYOGENES: NOT DETECTED
Serratia marcescens: NOT DETECTED
Streptococcus agalactiae: NOT DETECTED
Streptococcus pneumoniae: NOT DETECTED
Streptococcus species: NOT DETECTED
VANCOMYCIN RESISTANCE: NOT DETECTED

## 2017-03-30 LAB — CBC WITH DIFFERENTIAL/PLATELET
BASOS PCT: 0 %
Basophils Absolute: 0 10*3/uL (ref 0–0.1)
Eosinophils Absolute: 0.3 10*3/uL (ref 0–0.7)
Eosinophils Relative: 2 %
HEMATOCRIT: 31.5 % — AB (ref 40.0–52.0)
HEMOGLOBIN: 10.6 g/dL — AB (ref 13.0–18.0)
LYMPHS ABS: 1.8 10*3/uL (ref 1.0–3.6)
LYMPHS PCT: 15 %
MCH: 31.4 pg (ref 26.0–34.0)
MCHC: 33.7 g/dL (ref 32.0–36.0)
MCV: 93.1 fL (ref 80.0–100.0)
MONO ABS: 1 10*3/uL (ref 0.2–1.0)
MONOS PCT: 8 %
NEUTROS ABS: 8.9 10*3/uL — AB (ref 1.4–6.5)
NEUTROS PCT: 75 %
Platelets: 205 10*3/uL (ref 150–440)
RBC: 3.39 MIL/uL — ABNORMAL LOW (ref 4.40–5.90)
RDW: 13.7 % (ref 11.5–14.5)
WBC: 12 10*3/uL — ABNORMAL HIGH (ref 3.8–10.6)

## 2017-03-30 LAB — COMPREHENSIVE METABOLIC PANEL
ALBUMIN: 2.9 g/dL — AB (ref 3.5–5.0)
ALK PHOS: 86 U/L (ref 38–126)
ALT: 88 U/L — ABNORMAL HIGH (ref 17–63)
ANION GAP: 6 (ref 5–15)
AST: 73 U/L — ABNORMAL HIGH (ref 15–41)
BUN: 15 mg/dL (ref 6–20)
CHLORIDE: 103 mmol/L (ref 101–111)
CO2: 25 mmol/L (ref 22–32)
Calcium: 8.4 mg/dL — ABNORMAL LOW (ref 8.9–10.3)
Creatinine, Ser: 1.21 mg/dL (ref 0.61–1.24)
GFR calc Af Amer: >60 mL/min (ref >60)
GFR calc non Af Amer: 56 mL/min — ABNORMAL LOW (ref >60)
GLUCOSE: 121 mg/dL — AB (ref 65–99)
POTASSIUM: 3.8 mmol/L (ref 3.5–5.1)
SODIUM: 134 mmol/L — AB (ref 135–145)
Total Bilirubin: 2.6 mg/dL — ABNORMAL HIGH (ref 0.3–1.2)
Total Protein: 5.9 g/dL — ABNORMAL LOW (ref 6.5–8.1)

## 2017-03-30 MED ORDER — DEXTROSE 5 % IV SOLN
1.0000 g | Freq: Three times a day (TID) | INTRAVENOUS | Status: DC
  Administered 2017-03-31 (×2): 1 g via INTRAVENOUS
  Filled 2017-03-30 (×4): qty 1

## 2017-03-30 MED ORDER — IOPAMIDOL (ISOVUE-300) INJECTION 61%
100.0000 mL | Freq: Once | INTRAVENOUS | Status: AC | PRN
  Administered 2017-03-30: 100 mL via INTRAVENOUS

## 2017-03-30 MED ORDER — IOPAMIDOL (ISOVUE-300) INJECTION 61%
15.0000 mL | INTRAVENOUS | Status: AC
Start: 2017-03-30 — End: 2017-03-30
  Administered 2017-03-30 (×2): 15 mL via ORAL

## 2017-03-30 MED ORDER — CLOPIDOGREL BISULFATE 75 MG PO TABS
75.0000 mg | ORAL_TABLET | Freq: Every day | ORAL | Status: DC
  Administered 2017-03-30 – 2017-04-01 (×3): 75 mg via ORAL
  Filled 2017-03-30 (×3): qty 1

## 2017-03-30 MED ORDER — ASPIRIN 81 MG PO CHEW
81.0000 mg | CHEWABLE_TABLET | Freq: Every day | ORAL | Status: DC
  Administered 2017-03-30 – 2017-04-01 (×3): 81 mg via ORAL
  Filled 2017-03-30 (×3): qty 1

## 2017-03-30 MED ORDER — DEXTROSE 5 % IV SOLN
2.0000 g | INTRAVENOUS | Status: DC
  Administered 2017-03-30: 2 g via INTRAVENOUS
  Filled 2017-03-30 (×2): qty 2

## 2017-03-30 NOTE — Progress Notes (Addendum)
03/30/2017  Subjective: Patient had a fever last night of 102.  patient still has a good degree of epigastric pain.  He did have a very small bowel movement yesterday but still feels distended as well.  Denies any fevers or chills.  Vital signs: Temp:  [97.9 F (36.6 C)-102 F (38.9 C)] 97.9 F (36.6 C) (01/27 0442) Pulse Rate:  [82-105] 86 (01/27 0442) Resp:  [18-20] 18 (01/27 0442) BP: (123-142)/(45-55) 131/55 (01/27 0442) SpO2:  [95 %-97 %] 97 % (01/27 0442) Weight:  [69.1 kg (152 lb 5.4 oz)] 69.1 kg (152 lb 5.4 oz) (01/27 0442)   Intake/Output: 01/26 0701 - 01/27 0700 In: 3108 [P.O.:1860; I.V.:1248] Out: 625 [Urine:625] Last BM Date: 03/29/17  Physical Exam: Constitutional: No acute distress Abdomen: Soft, distended, with tenderness to palpation in the epigastric region.  Labs:  Recent Labs    03/29/17 0759 03/30/17 0454  WBC 10.9* 12.0*  HGB 10.6* 10.6*  HCT 30.7* 31.5*  PLT 192 205   Recent Labs    03/29/17 0759 03/30/17 0454  NA 130* 134*  K 4.1 3.8  CL 100* 103  CO2 22 25  GLUCOSE 101* 121*  BUN 19 15  CREATININE 1.33* 1.21  CALCIUM 8.3* 8.4*   No results for input(s): LABPROT, INR in the last 72 hours.  Imaging: No results found.  Assessment/Plan: 77 year old male with gallstone pancreatitis status post ERCP with only stent placement due to Plavix.  -Patient's LFTs did improve today compared to yesterday, but his white blood cell count has increased today and he did have a fever.  He is still having persistent upper abdominal pain and given his history of gallstone pancreatitis, it would be prudent to investigate his abdomen with a CT scan abdomen and pelvis with contrast to further evaluate his pancreas make sure there are no abscesses, areas of necrosis, and to also evaluate the extent of his inflammation.  Will be able to further elucidate the reason for his abdominal pain and whether we be able to advance his diet or not.  The contrast may also  help him to flush his intestines to have a bowel movement.  We will continue the MiraLAX here as well.  Depending on any findings on the CT scan, the patient may need antibiotics but at this point I would not know what to treat before CT scan is done.   Melvyn Neth, [place]

## 2017-03-30 NOTE — Progress Notes (Signed)
03/30/17  CT scan obtained today due to fever and persistent abdominal pain.  Imaging viewed independently.  There is evidence of pancreatitis.  His CBD stent appears in good position without any CBD dilation around it.  His gallbladder is decompressed without evidence of cholecystitis.  There is no bowel obstruction and contrast is in his colon.    At this point, it seems he just has persistent pain from his pancreatitis.  There are no surgical needs at this point.  Would still recommend deferring cholecystectomy for a few weeks until the inflammation from the pancreatitis improves.  Would favor having his ERCP and stent removal and CBD clearance by GI prior to cholecystectomy as well.  Conservative management for his pancreatitis.  Advance diet as tolerated based on his pain symptoms.  Follow up with surgery as outpatient.  Surgery team will sign off for now.  Please feel free to call or consult again if any questions or concerns.   Melvyn Neth, [place] 7a-7p: [place] 7p-7a: [phone]

## 2017-03-30 NOTE — Progress Notes (Signed)
[facility] [place] NAME: Jason House    MR#:  [id number]  DATE OF BIRTH:  [birth year]  SUBJECTIVE:  CHIEF COMPLAINT:   Chief Complaint  Patient presents with  . Abdominal Pain  still has abd pain.  Fever 102 last night REVIEW OF SYSTEMS:  CONSTITUTIONAL: No fever, fatigue or weakness.  EYES: No blurred or double vision.  EARS, NOSE, AND THROAT: No tinnitus or ear pain.  RESPIRATORY: No cough, shortness of breath, wheezing or hemoptysis.  CARDIOVASCULAR: No chest pain, orthopnea, edema.  GASTROINTESTINAL: No nausea, vomiting, diarrhea but has mid abdominal pain.  GENITOURINARY: No dysuria, hematuria.  ENDOCRINE: No polyuria, nocturia,  HEMATOLOGY: No anemia, easy bruising or bleeding SKIN: No rash or lesion. MUSCULOSKELETAL: No joint pain or arthritis.   NEUROLOGIC: No tingling, numbness, weakness.  PSYCHIATRY: No anxiety or depression.   ROS  DRUG ALLERGIES:  No Known Allergies  VITALS:  Blood pressure (!) 141/71, pulse (!) 102, temperature 98.1 F (36.7 C), temperature source Oral, resp. rate 16, height 5\' 7"  (1.702 m), weight 152 lb 5.4 oz (69.1 kg), SpO2 96 %.  PHYSICAL EXAMINATION:  GENERAL:  77 y.o.-year-old patient lying in the bed with no acute distress.  EYES: Pupils equal, round, reactive to light and accommodation. No scleral icterus. Extraocular muscles intact.  HEENT: Head atraumatic, normocephalic. Oropharynx and nasopharynx clear.  NECK:  Supple, no jugular venous distention. No thyroid enlargement, no tenderness.  LUNGS: Normal breath sounds bilaterally, no wheezing, rales,rhonchi or crepitation. No use of accessory muscles of respiration.  CARDIOVASCULAR: S1, S2 normal. No murmurs, rubs, or gallops.  ABDOMEN: Soft, mild diffuse tenderness, nondistended. Bowel sounds present. No organomegaly or mass.  EXTREMITIES: No pedal edema, cyanosis, or clubbing.  NEUROLOGIC: Cranial nerves II through XII are intact. Muscle  strength 5/5 in all extremities. Sensation intact. Gait not checked.  PSYCHIATRIC: The patient is alert and oriented x 3.  SKIN: No obvious rash, lesion, or ulcer.   Physical Exam LABORATORY PANEL:   CBC Recent Labs  Lab 03/30/17 0454  WBC 12.0*  HGB 10.6*  HCT 31.5*  PLT 205   ------------------------------------------------------------------------------------------------------------------  Chemistries  Recent Labs  Lab 03/30/17 0454  NA 134*  K 3.8  CL 103  CO2 25  GLUCOSE 121*  BUN 15  CREATININE 1.21  CALCIUM 8.4*  AST 73*  ALT 88*  ALKPHOS 86  BILITOT 2.6*   ------------------------------------------------------------------------------------------------------------------  Cardiac Enzymes Recent Labs  Lab 03/25/17 1649  TROPONINI <0.03   ------------------------------------------------------------------------------------------------------------------  RADIOLOGY:  No results found.  ASSESSMENT AND PLAN:   Active Problems:   Choledocholithiasis   Jaundice   Acute pancreatitis   1.  Choledocholithiasis The patient had right upper abdominal pain with concern for CBD stones with dilated ducts seen on CT scan  GI consultation - s/p ERCP, ERCP in 1 month to clear bile duct and remove stent. Liver function tests improving today.  Follow-up CMP. Hold aspirin and Plavix for possible sugery. Surgical consultation requested for possible cholecystectomy after ERCP Surgeon want Plavix off for 5 days, once patient is stable we can discharge and he can follow as outpatient with surgical clinic. Lipase is down to 113. Still keep clear liquid diet, hold aspirin and Plavix, CT of the abdomen to rule out abscess or necrotic pancreas per Dr. Hampton Abbot.  2. Acute pancreatitits. Continue IV fluids Continue supportive management with antiemetics and pain control  3. Hyponatremia, mild due to decreased by mouth intake Improved with normal  saline IV, hold  HCTZ  Acute renal failure with dehydration due to above, improved with IV fluid support.  4. CAD: Hold aspirin and Plavix Continue lisinopril, metoprolol and statin.  5 Essential hypertension: Continue metoprolol and lisinopril  6. Hypothyroid Continue Synthroid  Fever, leukocytosis.  Unclear etiology, follow-up blood culture and CT of the abdomen.  The patient is given Rocephin IV.  Follow-up CBC and blood culture  Stop antibiotics if no evidence of infection.  Discussed with Dr. Hampton Abbot and Dr. Marius Ditch. All the records are reviewed and case discussed with Care Management/Social Workerr. Management plans discussed with the patient, family and they are in agreement.  CODE STATUS: Full.  TOTAL TIME TAKING CARE OF THIS PATIENT: 38 minutes.  POSSIBLE D/C IN 2 DAYS, DEPENDING ON CLINICAL CONDITION.   Demetrios Loll M.D on 03/30/2017   Between 7am to 6pm - Pager - [phone]  After 6pm go to [email or web address] - password [email or web address] [facility]  Office  [phone]  CC: Primary care physician; Idelle Crouch, MD  Note: This dictation was prepared with Dragon dictation along with smaller phrase technology. Any transcriptional errors that result from this process are unintentional.

## 2017-03-30 NOTE — Consult Note (Signed)
Pharmacy Antibiotic Note  Jason House is a 77 y.o. male admitted on 03/27/2017 with intra-abdominal infection.  Pharmacy has been consulted for ceftriaxone dosing.  Plan: ceftriaxone 2g q 24hr  Height: 5\' 7"  (170.2 cm) Weight: 152 lb 5.4 oz (69.1 kg) IBW/kg (Calculated) : 66.1  Temp (24hrs), Avg:99.6 F (37.6 C), Min:97.9 F (36.6 C), Max:102 F (38.9 C)  Recent Labs  Lab 03/25/17 1649 03/27/17 1105 03/28/17 0428 03/29/17 0759 03/30/17 0454  WBC 14.4* 14.9*  --  10.9* 12.0*  CREATININE 1.55* 1.86* 1.57* 1.33* 1.21    Estimated Creatinine Clearance: 47.8 mL/min (by C-G formula based on SCr of 1.21 mg/dL).    No Known Allergies  Antimicrobials this admission: ceftriaxone 1/27 >>    Dose adjustments this admission:   Microbiology results: 1/27 BCx:    Thank you for allowing pharmacy to be a part of this patient's care.  Ramond Dial, Pharm.D, BCPS Clinical Pharmacist  03/30/2017 10:28 AM

## 2017-03-30 NOTE — Progress Notes (Signed)
Jason House , MD [address], [place], [place], [place], [address] [address], [place], [place], [place], [address] Phone: [phone]  Fax: [facility] is being followed for gall stone pancreatitis  Day 3 of follow up   Subjective:  Patient had episode of fever, 102 last night. He is also noted to have mild leukocytosis. He reports having epigastric pain which is at baseline. He denies nausea, vomiting. He reports having 2 bowel movements. He is evaluated by surgery today who recommended CT abdomen pelvis because of the fever. Patient reports that his epigastric pain got worse after drinking oral contrast.  Objective: Vital signs in last 24 hours: Vitals:   03/29/17 1347 03/29/17 2118 03/29/17 2243 03/30/17 0442  BP: (!) 142/53 (!) 123/45  (!) 131/55  Pulse: 82 (!) 105  86  Resp: _0 Temp: 99 F (37.2 C) (!) 102 F (38.9 C) 99.4 F (37.4 C) 97.9 F (36.6 C)  TempSrc: Oral Oral Oral Oral  SpO2: 97% 95%  97%  Weight:    152 lb 5.4 oz (69.1 kg)  Height:       Weight change:   Intake/Output Summary (Last 24 hours) at 03/30/2017 1204 Last data filed at 03/30/2017 1039 Gross per 24 hour  Intake 3117.02 ml  Output 300 ml  Net 2817.02 ml     Exam: Heart:: Regular rate and rhythm, S1S2 present or without murmur or extra heart sounds Lungs: normal, clear to auscultation and clear to auscultation and percussion Abdomen: soft, moderate epigastric tenderness, normal bowel sounds   Lab Results: CBC Latest Ref Rng & Units 03/30/2017 03/29/2017 03/27/2017  WBC 3.8 - 10.6 K/uL 12.0(H) 10.9(H) 14.9(H)  Hemoglobin 13.0 - 18.0 g/dL 10.6(L) 10.6(L) 11.8(L)  Hematocrit 40.0 - 52.0 % 31.5(L) 30.7(L) 35.2(L)  Platelets 150 - 440 K/uL 205 192 271   Hepatic Function Latest Ref Rng & Units 03/30/2017 03/29/2017 03/28/2017  Total Protein 6.5 - 8.1 g/dL 5.9(L) 6.1(L) 5.9(L)  Albumin 3.5 - 5.0 g/dL 2.9(L) 3.0(L) 3.3(L)  AST 15 - 41 U/L 73(H) 165(H) 37  ALT 17 -  63 U/L 88(H) 137(H) 45  Alk Phosphatase 38 - 126 U/L 86 96 39  Total Bilirubin 0.3 - 1.2 mg/dL 2.6(H) 4.9(H) 2.3(H)  Bilirubin, Direct 0.1 - 0.5 mg/dL - - -   BMP Latest Ref Rng & Units 03/30/2017 03/29/2017 03/28/2017  Glucose 65 - 99 mg/dL 121(H) 101(H) 71  BUN 6 - 20 mg/dL 15 19 26(H)  Creatinine 0.61 - 1.24 mg/dL 1.21 1.33(H) 1.57(H)  Sodium 135 - 145 mmol/L 134(L) 130(L) 135  Potassium 3.5 - 5.1 mmol/L 3.8 4.1 4.0  Chloride 101 - 111 mmol/L 103 100(L) 106  CO2 22 - 32 mmol/L 25 22 20(L)  Calcium 8.9 - 10.3 mg/dL 8.4(L) 8.3(L) 8.2(L)   Micro Results: No results found for this or any previous visit (from the past 240 hour(s)). Studies/Results: No results found. Medications: I have reviewed the patient's current medications. Scheduled Meds: . amLODipine  5 mg Oral Daily  . atorvastatin  80 mg Oral Daily  . cyclobenzaprine  10 mg Oral QHS  . dorzolamide-timolol  1 drop Both Eyes BID  . enoxaparin (LOVENOX) injection  40 mg Subcutaneous Q24H  . indomethacin  100 mg Rectal Once  . iopamidol  15 mL Oral Q1 Hr x 2  . latanoprost  1 drop Both Eyes QHS  . levothyroxine  50 mcg Oral QAC breakfast  . lisinopril  40 mg Oral Daily  . metoprolol tartrate  12.5 mg Oral BID  . polyethylene glycol  17 g Oral Daily  . vitamin B-12  1,000 mcg Oral Daily   Continuous Infusions: . cefTRIAXone (ROCEPHIN)  IV Stopped (03/30/17 1110)   PRN Meds:.acetaminophen **OR** acetaminophen, HYDROmorphone (DILAUDID) injection, ondansetron **OR** ondansetron (ZOFRAN) IV, polyethylene glycol   Assessment:  Jason House is a 77 y.o.male with gall stone pancreatitis, S/p ERCP 03/27/17 for cholidocholithiasis with placement of stent, stone not extracted as he was on Plavix. His LFTs have overall improved since placement of stent but not completely normal. Tbilirubin decreasing. He has ongoing epigastric pain secondary to pancreatitis, and 1 episode of fever in last 24 hours. There is no evidence of  cholangitis.  Plan: 1. Follow-up CT A/P 2. Cholecystectomy timing per surgery.  3. ERCP in 1 month to clear bile duct and remove stent.  4. Would hold off on antibiotics unless CT reveals necrotizing pancreatitis 5. Follow up on blood cultures 6. Okay with clear liquid diet  Will continue to follow     LOS: 3 days   Jason Bellows, MD 03/30/2017, 12:04 PM

## 2017-03-31 LAB — LIPASE, BLOOD: Lipase: 87 U/L — ABNORMAL HIGH (ref 11–51)

## 2017-03-31 LAB — CBC
HCT: 28.5 % — ABNORMAL LOW (ref 40.0–52.0)
Hemoglobin: 9.7 g/dL — ABNORMAL LOW (ref 13.0–18.0)
MCH: 31.4 pg (ref 26.0–34.0)
MCHC: 34.1 g/dL (ref 32.0–36.0)
MCV: 92.2 fL (ref 80.0–100.0)
PLATELETS: 199 10*3/uL (ref 150–440)
RBC: 3.09 MIL/uL — ABNORMAL LOW (ref 4.40–5.90)
RDW: 13.4 % (ref 11.5–14.5)
WBC: 9.3 10*3/uL (ref 3.8–10.6)

## 2017-03-31 LAB — BILIRUBIN, TOTAL: BILIRUBIN TOTAL: 1.3 mg/dL — AB (ref 0.3–1.2)

## 2017-03-31 MED ORDER — DEXTROSE 5 % IV SOLN
2.0000 g | Freq: Two times a day (BID) | INTRAVENOUS | Status: DC
  Administered 2017-03-31 – 2017-04-01 (×2): 2 g via INTRAVENOUS
  Filled 2017-03-31 (×3): qty 2

## 2017-03-31 MED ORDER — METRONIDAZOLE IN NACL 5-0.79 MG/ML-% IV SOLN
500.0000 mg | Freq: Three times a day (TID) | INTRAVENOUS | Status: DC
  Administered 2017-03-31 – 2017-04-01 (×3): 500 mg via INTRAVENOUS
  Filled 2017-03-31 (×5): qty 100

## 2017-03-31 MED ORDER — OXYCODONE-ACETAMINOPHEN 5-325 MG PO TABS: 1.0000 | ORAL_TABLET | Freq: Four times a day (QID) | ORAL | Status: DC | PRN

## 2017-03-31 NOTE — Consult Note (Signed)
[facility] Infectious Disease     Reason for Consult:Gram negative bacteremia   Referring Physician:  Demetrios Loll Date of Admission:  03/27/2017   Active Problems:   Choledocholithiasis   Jaundice   Acute pancreatitis   HPI: Jason House is a 77 y.o. male admitted with abd pain and found to have elevated lipase and lfts. Found to have gallstone pancreatits. Has had ERCP. BCX + GNR. Has been on cefepime but still occas fevers. WBC down from 15 to 9.    Past Medical History:  Diagnosis Date  . Anemia   . Coronary artery disease   . Glaucoma   . H/O ETOH abuse   . History of shingles   . Hyperlipidemia   . Hypertension    Past Surgical History:  Procedure Laterality Date  . COLONOSCOPY WITH PROPOFOL N/A 09/23/2014   Procedure: COLONOSCOPY WITH PROPOFOL;  Surgeon: Josefine Class, MD;  Location: [facility] ENDOSCOPY;  Service: Endoscopy;  Laterality: N/A;  . CORONARY ANGIOPLASTY    . CORONARY ARTERY BYPASS GRAFT    . EYE SURGERY     Social History   Tobacco Use  . Smoking status: Former Research scientist (life sciences)  . Smokeless tobacco: Never Used  Substance Use Topics  . Alcohol use: No    Frequency: Never  . Drug use: No   History reviewed. No pertinent family history.  Allergies: No Known Allergies  Current antibiotics: Antibiotics Given (last 72 hours)    Date/Time Action Medication Dose Rate   03/30/17 1036 New Bag/Given   cefTRIAXone (ROCEPHIN) 2 g in dextrose 5 % 50 mL IVPB 2 g 100 mL/hr   03/31/17 0038 New Bag/Given   ceFEPIme (MAXIPIME) 1 g in dextrose 5 % 50 mL IVPB 1 g 100 mL/hr   03/31/17 0839 New Bag/Given   ceFEPIme (MAXIPIME) 1 g in dextrose 5 % 50 mL IVPB 1 g 100 mL/hr      MEDICATIONS: . amLODipine  5 mg Oral Daily  . aspirin  81 mg Oral Daily  . atorvastatin  80 mg Oral Daily  . clopidogrel  75 mg Oral Daily  . cyclobenzaprine  10 mg Oral QHS  . dorzolamide-timolol  1 drop Both Eyes BID  . enoxaparin (LOVENOX) injection  40 mg Subcutaneous Q24H  .  indomethacin  100 mg Rectal Once  . latanoprost  1 drop Both Eyes QHS  . levothyroxine  50 mcg Oral QAC breakfast  . metoprolol tartrate  12.5 mg Oral BID  . polyethylene glycol  17 g Oral Daily  . vitamin B-12  1,000 mcg Oral Daily    Review of Systems - 11 systems reviewed and negative per HPI   OBJECTIVE: Temp:  [98.1 F (36.7 C)-101.3 F (38.5 C)] 98.1 F (36.7 C) (01/28 1145) Pulse Rate:  [90-106] 90 (01/28 1145) Resp:  [18] 18 (01/28 0356) BP: (113-141)/(42-54) 127/48 (01/28 1145) SpO2:  [97 %-99 %] 99 % (01/28 1145) Weight:  [93.1 kg (205 lb 4 oz)] 93.1 kg (205 lb 4 oz) (01/28 0356) Physical Exam  Constitutional: He is oriented to person, place, and time. He appears well-developed and well-nourished. Appears dyspneic after bathing HENT: anicteric Mouth/Throat: Oropharynx is clear and moist. No oropharyngeal exudate.  Cardiovascular: Tachy, IRR Pulmonary/Chest: Effort normal and breath sounds normal. No respiratory distress. He has no wheezes.  Abdominal: Soft. Bowel sounds are normal. He exhibits no distension. There is no tenderness.  Lymphadenopathy: He has no cervical adenopathy.  Neurological: He is alert and oriented to person, place,  and time.  Skin: Skin is warm and dry. No rash noted. No erythema.  Psychiatric: He has a normal mood and affect. His behavior is normal.   LABS: Results for orders placed or performed during the hospital encounter of 03/27/17 (from the past 48 hour(s))  Comprehensive metabolic panel     Status: Abnormal   Collection Time: 03/30/17  4:54 AM  Result Value Ref Range   Sodium 134 (L) 135 - 145 mmol/L   Potassium 3.8 3.5 - 5.1 mmol/L   Chloride 103 101 - 111 mmol/L   CO2 25 22 - 32 mmol/L   Glucose, Bld 121 (H) 65 - 99 mg/dL   BUN 15 6 - 20 mg/dL   Creatinine, Ser 1.21 0.61 - 1.24 mg/dL   Calcium 8.4 (L) 8.9 - 10.3 mg/dL   Total Protein 5.9 (L) 6.5 - 8.1 g/dL   Albumin 2.9 (L) 3.5 - 5.0 g/dL   AST 73 (H) 15 - 41 U/L   ALT 88 (H)  17 - 63 U/L   Alkaline Phosphatase 86 38 - 126 U/L   Total Bilirubin 2.6 (H) 0.3 - 1.2 mg/dL   GFR calc non Af Amer 56 (L) >60 mL/min   GFR calc Af Amer >60 >60 mL/min    Comment: (NOTE) The eGFR has been calculated using the CKD EPI equation. This calculation has not been validated in all clinical situations. eGFR's persistently <60 mL/min signify possible Chronic Kidney Disease.    Anion gap 6 5 - 15    Comment: Performed at [facility], [address] [place], [place] [address]  CBC with Differential/Platelet     Status: Abnormal   Collection Time: 03/30/17  4:54 AM  Result Value Ref Range   WBC 12.0 (H) 3.8 - 10.6 K/uL   RBC 3.39 (L) 4.40 - 5.90 MIL/uL   Hemoglobin 10.6 (L) 13.0 - 18.0 g/dL   HCT 31.5 (L) 40.0 - 52.0 %   MCV 93.1 80.0 - 100.0 fL   MCH 31.4 26.0 - 34.0 pg   MCHC 33.7 32.0 - 36.0 g/dL   RDW 13.7 11.5 - 14.5 %   Platelets 205 150 - 440 K/uL   Neutrophils Relative % 75 %   Neutro Abs 8.9 (H) 1.4 - 6.5 K/uL   Lymphocytes Relative 15 %   Lymphs Abs 1.8 1.0 - 3.6 K/uL   Monocytes Relative 8 %   Monocytes Absolute 1.0 0.2 - 1.0 K/uL   Eosinophils Relative 2 %   Eosinophils Absolute 0.3 0 - 0.7 K/uL   Basophils Relative 0 %   Basophils Absolute 0.0 0 - 0.1 K/uL    Comment: Performed at [facility], [address] [place], [place] [address]  CULTURE, BLOOD (ROUTINE X 2) w Reflex to ID Panel     Status: None (Preliminary result)   Collection Time: 03/30/17  7:42 AM  Result Value Ref Range   Specimen Description BLOOD RIGHT ARM    Special Requests Blood Culture adequate volume    Culture  Setup Time      GRAM NEGATIVE RODS ANAEROBIC BOTTLE ONLY CRITICAL VALUE NOTED.  VALUE IS CONSISTENT WITH PREVIOUSLY REPORTED AND CALLED VALUE. Performed at [facility], [address] [place], [place] [address]    Culture GRAM NEGATIVE RODS    Report Status PENDING   CULTURE, BLOOD (ROUTINE X 2) w Reflex to ID Panel     Status: None  (Preliminary result)   Collection Time: 03/30/17  7:50 AM  Result Value Ref Range   Specimen Description      BLOOD RIGHT HAND Performed at [facility], [address] [place], [place] [address]    Special Requests      Blood Culture adequate volume Performed at [facility], [address] [place], [place] [address]    Culture  Setup Time      IN BOTH AEROBIC AND ANAEROBIC BOTTLES GRAM NEGATIVE RODS CRITICAL RESULT CALLED TO, READ BACK BY AND VERIFIED WITH: MATT MCBANE @ 2214 ON 03/30/2017 BY CAF    Culture      GRAM NEGATIVE RODS CULTURE REINCUBATED FOR BETTER GROWTH Performed at [facility], [place] [address], [place], [place] [address]    Report Status PENDING   Blood Culture ID Panel (Reflexed)     Status: Abnormal   Collection Time: 03/30/17  7:50 AM  Result Value Ref Range   Enterococcus species NOT DETECTED NOT DETECTED   Vancomycin resistance NOT DETECTED NOT DETECTED   Listeria monocytogenes NOT DETECTED NOT DETECTED   Staphylococcus species NOT DETECTED NOT DETECTED   Staphylococcus aureus NOT DETECTED NOT DETECTED   Methicillin resistance NOT DETECTED NOT DETECTED   Streptococcus species NOT DETECTED NOT DETECTED   Streptococcus agalactiae NOT DETECTED NOT DETECTED   Streptococcus pneumoniae NOT DETECTED NOT DETECTED   Streptococcus pyogenes NOT DETECTED NOT DETECTED   Acinetobacter baumannii NOT DETECTED NOT DETECTED   Enterobacteriaceae species DETECTED (A) NOT DETECTED    Comment: Enterobacteriaceae represent a large family of gram negative bacteria, not a single organism. Refer to culture for further identification. CRITICAL RESULT CALLED TO, READ BACK BY AND VERIFIED WITH: MATT MCBANE @ 2214 ON 03/30/2017 BY CAF    Enterobacter cloacae complex NOT DETECTED NOT DETECTED   Escherichia coli NOT DETECTED NOT DETECTED   Klebsiella oxytoca NOT DETECTED NOT DETECTED   Klebsiella pneumoniae NOT DETECTED NOT DETECTED   Proteus species NOT  DETECTED NOT DETECTED   Serratia marcescens NOT DETECTED NOT DETECTED   Carbapenem resistance NOT DETECTED NOT DETECTED   Haemophilus influenzae NOT DETECTED NOT DETECTED   Neisseria meningitidis NOT DETECTED NOT DETECTED   Pseudomonas aeruginosa NOT DETECTED NOT DETECTED   Candida albicans NOT DETECTED NOT DETECTED   Candida glabrata NOT DETECTED NOT DETECTED   Candida krusei NOT DETECTED NOT DETECTED   Candida parapsilosis NOT DETECTED NOT DETECTED   Candida tropicalis NOT DETECTED NOT DETECTED    Comment: Performed at [facility], [address] [place], [place] [address]  CBC     Status: Abnormal   Collection Time: 03/31/17  4:13 AM  Result Value Ref Range   WBC 9.3 3.8 - 10.6 K/uL   RBC 3.09 (L) 4.40 - 5.90 MIL/uL   Hemoglobin 9.7 (L) 13.0 - 18.0 g/dL   HCT 28.5 (L) 40.0 - 52.0 %   MCV 92.2 80.0 - 100.0 fL   MCH 31.4 26.0 - 34.0 pg   MCHC 34.1 32.0 - 36.0 g/dL   RDW 13.4 11.5 - 14.5 %   Platelets 199 150 - 440 K/uL    Comment: Performed at [facility], [address] [place], [place] [address]  Bilirubin, total     Status: Abnormal   Collection Time: 03/31/17  4:13 AM  Result Value Ref Range   Total Bilirubin 1.3 (H) 0.3 - 1.2 mg/dL    Comment: Performed at [facility], [address] [place],  [address]  Lipase, blood     Status: Abnormal  Collection Time: 03/31/17  4:13 AM  Result Value Ref Range   Lipase 87 (H) 11 - 51 U/L    Comment: Performed at [facility], [address] [place], [place] [address]   No components found for: ESR, C REACTIVE PROTEIN MICRO: Recent Results (from the past 720 hour(s))  CULTURE, BLOOD (ROUTINE X 2) w Reflex to ID Panel     Status: None (Preliminary result)   Collection Time: 03/30/17  7:42 AM  Result Value Ref Range Status   Specimen Description BLOOD RIGHT ARM  Final   Special Requests Blood Culture adequate volume  Final   Culture  Setup Time   Final    GRAM NEGATIVE RODS  ANAEROBIC BOTTLE ONLY CRITICAL VALUE NOTED.  VALUE IS CONSISTENT WITH PREVIOUSLY REPORTED AND CALLED VALUE. Performed at [facility], [address] [place], [place] [address]    Culture GRAM NEGATIVE RODS  Final   Report Status PENDING  Incomplete  CULTURE, BLOOD (ROUTINE X 2) w Reflex to ID Panel     Status: None (Preliminary result)   Collection Time: 03/30/17  7:50 AM  Result Value Ref Range Status   Specimen Description   Final    BLOOD RIGHT HAND Performed at [facility], [address], [place], [place] [address]    Special Requests   Final    Blood Culture adequate volume Performed at [facility], [address] [place], [place] [address]    Culture  Setup Time   Final    IN BOTH AEROBIC AND ANAEROBIC BOTTLES GRAM NEGATIVE RODS CRITICAL RESULT CALLED TO, READ BACK BY AND VERIFIED WITH: MATT MCBANE @ 2214 ON 03/30/2017 BY CAF    Culture   Final    GRAM NEGATIVE RODS CULTURE REINCUBATED FOR BETTER GROWTH Performed at [facility], [place] [address] [place], [place] [address]    Report Status PENDING  Incomplete  Blood Culture ID Panel (Reflexed)     Status: Abnormal   Collection Time: 03/30/17  7:50 AM  Result Value Ref Range Status   Enterococcus species NOT DETECTED NOT DETECTED Final   Vancomycin resistance NOT DETECTED NOT DETECTED Final   Listeria monocytogenes NOT DETECTED NOT DETECTED Final   Staphylococcus species NOT DETECTED NOT DETECTED Final   Staphylococcus aureus NOT DETECTED NOT DETECTED Final   Methicillin resistance NOT DETECTED NOT DETECTED Final   Streptococcus species NOT DETECTED NOT DETECTED Final   Streptococcus agalactiae NOT DETECTED NOT DETECTED Final   Streptococcus pneumoniae NOT DETECTED NOT DETECTED Final   Streptococcus pyogenes NOT DETECTED NOT DETECTED Final   Acinetobacter baumannii NOT DETECTED NOT DETECTED Final   Enterobacteriaceae species DETECTED (A) NOT DETECTED Final    Comment:  Enterobacteriaceae represent a large family of gram negative bacteria, not a single organism. Refer to culture for further identification. CRITICAL RESULT CALLED TO, READ BACK BY AND VERIFIED WITH: MATT MCBANE @ 2214 ON 03/30/2017 BY CAF    Enterobacter cloacae complex NOT DETECTED NOT DETECTED Final   Escherichia coli NOT DETECTED NOT DETECTED Final   Klebsiella oxytoca NOT DETECTED NOT DETECTED Final   Klebsiella pneumoniae NOT DETECTED NOT DETECTED Final   Proteus species NOT DETECTED NOT DETECTED Final   Serratia marcescens NOT DETECTED NOT DETECTED Final   Carbapenem resistance NOT DETECTED NOT DETECTED Final   Haemophilus influenzae NOT DETECTED NOT DETECTED Final   Neisseria meningitidis NOT DETECTED NOT DETECTED Final   Pseudomonas aeruginosa NOT DETECTED NOT DETECTED Final  Candida albicans NOT DETECTED NOT DETECTED Final   Candida glabrata NOT DETECTED NOT DETECTED Final   Candida krusei NOT DETECTED NOT DETECTED Final   Candida parapsilosis NOT DETECTED NOT DETECTED Final   Candida tropicalis NOT DETECTED NOT DETECTED Final    Comment: Performed at [facility], [address] [place], [place] [address]    IMAGING: Ct Head Wo Contrast  Result Date: 03/25/2017 CLINICAL DATA:  Syncopal episode after taking 1 nitroglycerin. EXAM: CT HEAD WITHOUT CONTRAST TECHNIQUE: Contiguous axial images were obtained from the base of the skull through the vertex without intravenous contrast. COMPARISON:  12/12/2014 FINDINGS: Brain: Minimal chronic appearing small vessel ischemic disease of periventricular and subcortical white matter. Superficial atrophy is noted. No acute intracranial hemorrhage, midline shift or edema. No large vascular territory infarct. No intra-axial mass nor extra-axial fluid collections. Vascular: Atherosclerosis of the carotid siphons without hyperdense vessels. Skull: No suspicious osseous lesions or fracture. Sinuses/Orbits: Trace right mastoid effusion. Clear  paranasal sinuses. Intact included orbits. Other: The left posterior parietal scalp contusion. IMPRESSION: 1. Left posterior parietal scalp contusion. 2. Minimal chronic small vessel ischemic disease and superficial atrophy. No acute intracranial abnormality. Electronically Signed   By: Ashley Royalty M.D.   On: 03/25/2017 17:20   Ct Abdomen Pelvis W Contrast  Result Date: 03/30/2017 CLINICAL DATA:  Patient with history of gallstone induced pancreatitis status post ERCP 03/27/2017 and placement of CBD stent. Persistent epigastric pain. Fever. EXAM: CT ABDOMEN AND PELVIS WITH CONTRAST TECHNIQUE: Multidetector CT imaging of the abdomen and pelvis was performed using the standard protocol following bolus administration of intravenous contrast. CONTRAST:  132m ISOVUE-300 IOPAMIDOL (ISOVUE-300) INJECTION 61% COMPARISON:  CT abdomen pelvis 03/25/2017 FINDINGS: Lower chest: Normal heart size. Interval development of small layering right pleural effusion. Stable 4 mm nodule left lower lobe and subpleural 2 mm left lower lobe nodule dating back to 2010, compatible with benign process. Hepatobiliary: Liver is normal in size and contour. Gallbladder is decompressed. Pneumobilia within the left hepatic lobe. Stent is demonstrated within the mid common bile duct extending into the descending portion of the duodenum. Pancreas: Mild inflammatory fat stranding about the pancreas predominately involving the head, uncinate process and proximal body. Spleen: Unremarkable Adrenals/Urinary Tract: Normal adrenal glands. Kidneys enhance symmetrically with contrast. No hydronephrosis. Urinary bladder is unremarkable. Stomach/Bowel: Oral contrast material throughout the small and large bowel. Descending colonic diverticulosis. No CT evidence for acute diverticulitis. Normal appendix. Normal morphology of the stomach. No evidence for small bowel obstruction. No free intraperitoneal air. Vascular/Lymphatic: Normal caliber abdominal aorta.  Peripheral calcified atherosclerotic plaque. No retroperitoneal lymphadenopathy. There is vascular flow demonstrated in the celiac axis and superior mesenteric artery. Probable occlusion of the origin of the inferior mesenteric artery with distal reconstitution. 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 Reproductive: Prostate gland is unremarkable. Other: Small bilateral fat containing inguinal hernias. Musculoskeletal: Lumbar spine degenerative changes. No aggressive or acute appearing osseous lesions. IMPRESSION: 1. Interval placement of a common bile duct stent with the distal aspect in the descending duodenum. 2. Inflammatory changes about the pancreas, centered about the head and uncinate process, compatible with acute pancreatitis. No fluid collection identified. 3. New small right pleural effusion. 4. 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 Electronically Signed   By: DLovey NewcomerM.D.   On: 03/30/2017 15:09   Ct Abdomen Pelvis W Contrast  Result Date: 03/25/2017 CLINICAL DATA:  Upper abdominal pain and chest pain. Elevated lipase. EXAM: CT  ABDOMEN AND PELVIS WITH CONTRAST TECHNIQUE: Multidetector CT imaging of the abdomen and pelvis was performed using the standard protocol following bolus administration of intravenous contrast. CONTRAST:  53m ISOVUE-300 IOPAMIDOL (ISOVUE-300) INJECTION 61% COMPARISON:  06/28/2016 FINDINGS: Lower chest: Small nodular densities at the left lung base, largest measuring 4 mm on sequence 4, image 11. Findings are similar to the previous examination. No pleural effusions. Hepatobiliary: Mild periportal edema is nonspecific. No suspicious liver lesions. Mild distention of the gallbladder without inflammatory changes. The common bile duct is mildly dilated near the ampulla measuring up to 9 mm and previously measured 6 mm. No definite stones or obstructing lesion in the distal common bile duct. Pancreas: Normal  appearance of the pancreas without inflammation or duct dilatation. Spleen: Normal appearance of spleen without enlargement. Adrenals/Urinary Tract: Normal adrenal glands. Normal appearance of both kidneys without hydronephrosis. Urinary bladder is unremarkable. Stomach/Bowel: Small hiatal hernia. Appendix is normal. Scattered colonic diverticula without acute inflammatory changes. No evidence for bowel obstruction. Rectum is distended with fluid and stool-like material. Vascular/Lymphatic: Again noted is the extensive atherosclerotic disease involving the distal descending thoracic aorta and the abdominal aorta. There flow in the celiac trunk and SMA. Origin of the IMA is probably occluded with distal reconstitution. Again noted is critical stenosis of the proximal right common iliac artery. No lymph node enlargement in the abdomen or pelvis. Reproductive: Prostate is unremarkable. Other: No free fluid.  Negative for free air. Musculoskeletal: No acute bone abnormality. IMPRESSION: No acute abnormality within the abdomen or pelvis. Slightly increased dilatation of the distal common bile duct is nonspecific. Recommend correlation with liver function tests. If there is clinical concern for choledocholithiasis, consider further characterization with MRCP. Severe atherosclerotic disease involving the aorta and the proximal right common iliac artery. Patient likely has hemodynamically significant stenosis involving the proximal right common iliac artery. These findings are similar to the prior examination. Again noted are small nodules at the lung bases. No follow-up needed if patient is low-risk (and has no known or suspected primary neoplasm). Non-contrast chest CT can be considered in 12 months if patient is high-risk. This recommendation follows the consensus statement: Guidelines for Management of Incidental Pulmonary Nodules Detected on CT Images: From the Fleischner Society 2017; Radiology 2017; 284:228-243.  Electronically Signed   By: AMarkus DaftM.D.   On: 03/25/2017 19:04   Dg Abdomen Acute W/chest  Result Date: 03/25/2017 CLINICAL DATA:  Abdominal pain. EXAM: DG ABDOMEN ACUTE W/ 1V CHEST COMPARISON:  CT 06/28/2016.  Chest x-ray report 06/13/2016. FINDINGS: Prior CABG. Heart size normal nipple shadow noted over the left lung base. No acute cardiopulmonary disease identified. Soft tissues the abdomen are unremarkable. No bowel distention. Scattered air-fluid levels noted in the colon. Diarrheal illness could present this fashion. No evidence of bowel obstruction or free air. Diffuse osteopenia and degenerative change. No acute bony abnormality. IMPRESSION: 1.  No acute cardiopulmonary disease.  Prior CABG. 2. Scattered air-fluid levels noted in the colon. Diarrheal illness could present this fashion. There is no evidence of bowel distention or free air. Electronically Signed   By: TMarcello Moores Register   On: 03/25/2017 17:30   Dg C-arm 1-60 Min-no Report  Result Date: 03/27/2017 Fluoroscopy was utilized by the requesting physician.  No radiographic interpretation.    Assessment:   BKENTARIUS PARTINGTONis a 77y.o. male admitted with gallstone pancreatitis now s/p ERCP with stent placement, with associated Gram negative bacteremia.  He will need repeat ERCP and stone and stent  removal.   Recommendations Cont cefepime pending culture result.  Add flagyl for anaerobic coverage as cholangitis is usually polymicrobial Will be able to dc home hopefully in 1-2 days on oral regimen depending on cultures.  I would suggest he stay on abx up until he undergoes repeat ERCP with stone extraction and stent removal.  Hopefully this can be expedited.  Thank you very much for allowing me to participate in the care of this patient. Please call with questions.   Cheral Marker. Ola Spurr, MD

## 2017-03-31 NOTE — Progress Notes (Addendum)
Lucilla Lame, MD [facility]   [address], [place], [place] [address] Phone: [phone] Fax : [phone]   Subjective: This patient had an ERCP for gallstone pancreatitis.  The patient reports that he has been tolerating his increased p.o. intake and would like to go home.  The patient reports that he does have some abdominal pain after he drinks water.  The patient had an ERCP and stent placement due to him being on Plavix.  At the time of the ERCP the patient had an obstructing bile duct stone.   Objective: Vital signs in last 24 hours: Vitals:   03/31/17 0356 03/31/17 0503 03/31/17 0926 03/31/17 1145  BP: (!) 113/42  (!) 125/45 (!) 127/48  Pulse: 96  93 90  Resp: 18     Temp: (!) 101.3 F (38.5 C) 98.6 F (37 C)  98.1 F (36.7 C)  TempSrc: Oral   Oral  SpO2: 97%   99%  Weight: 205 lb 4 oz (93.1 kg)     Height:       Weight change: 52 lb 14.6 oz (24 kg)  Intake/Output Summary (Last 24 hours) at 03/31/2017 1400 Last data filed at 03/31/2017 1024 Gross per 24 hour  Intake 580 ml  Output -  Net 580 ml     Exam: Heart:: Regular rate and rhythm, S1S2 present or without murmur or extra heart sounds Lungs: normal and clear to auscultation and percussion Abdomen: soft, nontender, normal bowel sounds   Lab Results: @LABTEST2 @ Micro Results: Recent Results (from the past 240 hour(s))  CULTURE, BLOOD (ROUTINE X 2) w Reflex to ID Panel     Status: None (Preliminary result)   Collection Time: 03/30/17  7:42 AM  Result Value Ref Range Status   Specimen Description BLOOD RIGHT ARM  Final   Special Requests Blood Culture adequate volume  Final   Culture  Setup Time   Final    GRAM NEGATIVE RODS ANAEROBIC BOTTLE ONLY CRITICAL VALUE NOTED.  VALUE IS CONSISTENT WITH PREVIOUSLY REPORTED AND CALLED VALUE. Performed at [facility], [address] [place], [place] [address]    Culture GRAM NEGATIVE RODS  Final   Report Status PENDING  Incomplete  CULTURE,  BLOOD (ROUTINE X 2) w Reflex to ID Panel     Status: None (Preliminary result)   Collection Time: 03/30/17  7:50 AM  Result Value Ref Range Status   Specimen Description   Final    BLOOD RIGHT HAND Performed at [facility], [address] [place], [place] [address]    Special Requests   Final    Blood Culture adequate volume Performed at [facility], [address] [place], [place] [address]    Culture  Setup Time   Final    IN BOTH AEROBIC AND ANAEROBIC BOTTLES GRAM NEGATIVE RODS CRITICAL RESULT CALLED TO, READ BACK BY AND VERIFIED WITH: MATT MCBANE @ 2214 ON 03/30/2017 BY CAF    Culture   Final    GRAM NEGATIVE RODS CULTURE REINCUBATED FOR BETTER GROWTH Performed at [facility], [place] [address], [place], [place] [address]    Report Status PENDING  Incomplete  Blood Culture ID Panel (Reflexed)     Status: Abnormal   Collection Time: 03/30/17  7:50 AM  Result Value Ref Range Status   Enterococcus species NOT DETECTED NOT DETECTED Final   Vancomycin resistance NOT DETECTED NOT DETECTED Final   Listeria monocytogenes NOT DETECTED NOT DETECTED Final   Staphylococcus species  NOT DETECTED NOT DETECTED Final   Staphylococcus aureus NOT DETECTED NOT DETECTED Final   Methicillin resistance NOT DETECTED NOT DETECTED Final   Streptococcus species NOT DETECTED NOT DETECTED Final   Streptococcus agalactiae NOT DETECTED NOT DETECTED Final   Streptococcus pneumoniae NOT DETECTED NOT DETECTED Final   Streptococcus pyogenes NOT DETECTED NOT DETECTED Final   Acinetobacter baumannii NOT DETECTED NOT DETECTED Final   Enterobacteriaceae species DETECTED (A) NOT DETECTED Final    Comment: Enterobacteriaceae represent a large family of gram negative bacteria, not a single organism. Refer to culture for further identification. CRITICAL RESULT CALLED TO, READ BACK BY AND VERIFIED WITH: MATT MCBANE @ 2214 ON 03/30/2017 BY CAF    Enterobacter cloacae complex NOT DETECTED  NOT DETECTED Final   Escherichia coli NOT DETECTED NOT DETECTED Final   Klebsiella oxytoca NOT DETECTED NOT DETECTED Final   Klebsiella pneumoniae NOT DETECTED NOT DETECTED Final   Proteus species NOT DETECTED NOT DETECTED Final   Serratia marcescens NOT DETECTED NOT DETECTED Final   Carbapenem resistance NOT DETECTED NOT DETECTED Final   Haemophilus influenzae NOT DETECTED NOT DETECTED Final   Neisseria meningitidis NOT DETECTED NOT DETECTED Final   Pseudomonas aeruginosa NOT DETECTED NOT DETECTED Final   Candida albicans NOT DETECTED NOT DETECTED Final   Candida glabrata NOT DETECTED NOT DETECTED Final   Candida krusei NOT DETECTED NOT DETECTED Final   Candida parapsilosis NOT DETECTED NOT DETECTED Final   Candida tropicalis NOT DETECTED NOT DETECTED Final    Comment: Performed at [facility], [address] [place], [place] [address]   Studies/Results: Ct Abdomen Pelvis W Contrast  Result Date: 03/30/2017 CLINICAL DATA:  Patient with history of gallstone induced pancreatitis status post ERCP 03/27/2017 and placement of CBD stent. Persistent epigastric pain. Fever. EXAM: CT ABDOMEN AND PELVIS WITH CONTRAST TECHNIQUE: Multidetector CT imaging of the abdomen and pelvis was performed using the standard protocol following bolus administration of intravenous contrast. CONTRAST:  175mL ISOVUE-300 IOPAMIDOL (ISOVUE-300) INJECTION 61% COMPARISON:  CT abdomen pelvis 03/25/2017 FINDINGS: Lower chest: Normal heart size. Interval development of small layering right pleural effusion. Stable 4 mm nodule left lower lobe and subpleural 2 mm left lower lobe nodule dating back to 2010, compatible with benign process. Hepatobiliary: Liver is normal in size and contour. Gallbladder is decompressed. Pneumobilia within the left hepatic lobe. Stent is demonstrated within the mid common bile duct extending into the descending portion of the duodenum. Pancreas: Mild inflammatory fat stranding about the  pancreas predominately involving the head, uncinate process and proximal body. Spleen: Unremarkable Adrenals/Urinary Tract: Normal adrenal glands. Kidneys enhance symmetrically with contrast. No hydronephrosis. Urinary bladder is unremarkable. Stomach/Bowel: Oral contrast material throughout the small and large bowel. Descending colonic diverticulosis. No CT evidence for acute diverticulitis. Normal appendix. Normal morphology of the stomach. No evidence for small bowel obstruction. No free intraperitoneal air. Vascular/Lymphatic: Normal caliber abdominal aorta. Peripheral calcified atherosclerotic plaque. No retroperitoneal lymphadenopathy. There is vascular flow demonstrated in the celiac axis and superior mesenteric artery. Probable occlusion of the origin of the inferior mesenteric artery with distal reconstitution. 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 Reproductive: Prostate gland is unremarkable. Other: Small bilateral fat containing inguinal hernias. Musculoskeletal: Lumbar spine degenerative changes. No aggressive or acute appearing osseous lesions. IMPRESSION: 1. Interval placement of a common bile duct stent with the distal aspect in the descending duodenum. 2. Inflammatory changes about the pancreas, centered about the head and uncinate process, compatible with acute pancreatitis. No fluid  collection identified. 3. New small right pleural effusion. 4. 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 Electronically Signed   By: Lovey Newcomer M.D.   On: 03/30/2017 15:09   Medications: I have reviewed the patient's current medications. Scheduled Meds: . amLODipine  5 mg Oral Daily  . aspirin  81 mg Oral Daily  . atorvastatin  80 mg Oral Daily  . clopidogrel  75 mg Oral Daily  . cyclobenzaprine  10 mg Oral QHS  . dorzolamide-timolol  1 drop Both Eyes BID  . enoxaparin (LOVENOX) injection  40 mg Subcutaneous Q24H  . indomethacin  100 mg Rectal  Once  . latanoprost  1 drop Both Eyes QHS  . levothyroxine  50 mcg Oral QAC breakfast  . metoprolol tartrate  12.5 mg Oral BID  . polyethylene glycol  17 g Oral Daily  . vitamin B-12  1,000 mcg Oral Daily   Continuous Infusions: . ceFEPime (MAXIPIME) IV     PRN Meds:.acetaminophen **OR** acetaminophen, HYDROmorphone (DILAUDID) injection, ondansetron **OR** ondansetron (ZOFRAN) IV, polyethylene glycol   Assessment: Active Problems:   Choledocholithiasis   Jaundice   Acute pancreatitis    Plan: This patient was admitted with choledocholithiasis with jaundice and acute pancreatitis.  The patient had an ERCP with a stent placed without a sphincterotomy due to him being on Plavix.  The patient will need a repeat ERCP for a sphincterotomy stone extraction and stent removal.  The patient has been given my card and has been told to contact my office when he gets out of the hospital to set up the ERCP off of Plavix.  The patient has been explained the plan and agrees with it. I will sign off.  Please call if any further GI concerns or questions.  We would like to thank you for the opportunity to participate in the care of Geoge W Helser.     LOS: 4 days   Shinita Mac 03/31/2017, 2:00 PM

## 2017-03-31 NOTE — Progress Notes (Addendum)
[facility] [place] NAME: Alister Staver    MR#:  [id number]  DATE OF BIRTH:  05-Dec-1940  SUBJECTIVE:  CHIEF COMPLAINT:   Chief Complaint  Patient presents with  . Abdominal Pain  better abd pain.  Fever 101.3 last night REVIEW OF SYSTEMS:  CONSTITUTIONAL: No fever, fatigue or weakness.  EYES: No blurred or double vision.  EARS, NOSE, AND THROAT: No tinnitus or ear pain.  RESPIRATORY: No cough, shortness of breath, wheezing or hemoptysis.  CARDIOVASCULAR: No chest pain, orthopnea, edema.  GASTROINTESTINAL: No nausea, vomiting, diarrhea but has mid abdominal pain.  GENITOURINARY: No dysuria, hematuria.  ENDOCRINE: No polyuria, nocturia,  HEMATOLOGY: No anemia, easy bruising or bleeding SKIN: No rash or lesion. MUSCULOSKELETAL: No joint pain or arthritis.   NEUROLOGIC: No tingling, numbness, weakness.  PSYCHIATRY: No anxiety or depression.   ROS  DRUG ALLERGIES:  No Known Allergies  VITALS:  Blood pressure (!) 127/48, pulse 90, temperature 98.1 F (36.7 C), temperature source Oral, resp. rate 18, height 5\' 7"  (1.702 m), weight 205 lb 4 oz (93.1 kg), SpO2 99 %.  PHYSICAL EXAMINATION:  GENERAL:  77 y.o.-year-old patient lying in the bed with no acute distress.  EYES: Pupils equal, round, reactive to light and accommodation. No scleral icterus. Extraocular muscles intact.  HEENT: Head atraumatic, normocephalic. Oropharynx and nasopharynx clear.  NECK:  Supple, no jugular venous distention. No thyroid enlargement, no tenderness.  LUNGS: Normal breath sounds bilaterally, no wheezing, rales,rhonchi or crepitation. No use of accessory muscles of respiration.  CARDIOVASCULAR: S1, S2 normal. No murmurs, rubs, or gallops.  ABDOMEN: Soft, mild tenderness, nondistended. Bowel sounds present. No organomegaly or mass.  EXTREMITIES: No pedal edema, cyanosis, or clubbing.  NEUROLOGIC: Cranial nerves II through XII are intact. Muscle strength 5/5 in  all extremities. Sensation intact. Gait not checked.  PSYCHIATRIC: The patient is alert and oriented x 3.  SKIN: No obvious rash, lesion, or ulcer.   Physical Exam LABORATORY PANEL:   CBC Recent Labs  Lab 03/31/17 0413  WBC 9.3  HGB 9.7*  HCT 28.5*  PLT 199   ------------------------------------------------------------------------------------------------------------------  Chemistries  Recent Labs  Lab 03/30/17 0454 03/31/17 0413  NA 134*  --   K 3.8  --   CL 103  --   CO2 25  --   GLUCOSE 121*  --   BUN 15  --   CREATININE 1.21  --   CALCIUM 8.4*  --   AST 73*  --   ALT 88*  --   ALKPHOS 86  --   BILITOT 2.6* 1.3*   ------------------------------------------------------------------------------------------------------------------  Cardiac Enzymes Recent Labs  Lab 03/25/17 1649  TROPONINI <0.03   ------------------------------------------------------------------------------------------------------------------  RADIOLOGY:  Ct Abdomen Pelvis W Contrast  Result Date: 03/30/2017 CLINICAL DATA:  Patient with history of gallstone induced pancreatitis status post ERCP 03/27/2017 and placement of CBD stent. Persistent epigastric pain. Fever. EXAM: CT ABDOMEN AND PELVIS WITH CONTRAST TECHNIQUE: Multidetector CT imaging of the abdomen and pelvis was performed using the standard protocol following bolus administration of intravenous contrast. CONTRAST:  164mL ISOVUE-300 IOPAMIDOL (ISOVUE-300) INJECTION 61% COMPARISON:  CT abdomen pelvis 03/25/2017 FINDINGS: Lower chest: Normal heart size. Interval development of small layering right pleural effusion. Stable 4 mm nodule left lower lobe and subpleural 2 mm left lower lobe nodule dating back to 2010, compatible with benign process. Hepatobiliary: Liver is normal in size and contour. Gallbladder is decompressed. Pneumobilia within the left hepatic lobe. Stent is demonstrated  within the mid common bile duct extending into the  descending portion of the duodenum. Pancreas: Mild inflammatory fat stranding about the pancreas predominately involving the head, uncinate process and proximal body. Spleen: Unremarkable Adrenals/Urinary Tract: Normal adrenal glands. Kidneys enhance symmetrically with contrast. No hydronephrosis. Urinary bladder is unremarkable. Stomach/Bowel: Oral contrast material throughout the small and large bowel. Descending colonic diverticulosis. No CT evidence for acute diverticulitis. Normal appendix. Normal morphology of the stomach. No evidence for small bowel obstruction. No free intraperitoneal air. Vascular/Lymphatic: Normal caliber abdominal aorta. Peripheral calcified atherosclerotic plaque. No retroperitoneal lymphadenopathy. There is vascular flow demonstrated in the celiac axis and superior mesenteric artery. Probable occlusion of the origin of the inferior mesenteric artery with distal reconstitution. 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 Reproductive: Prostate gland is unremarkable. Other: Small bilateral fat containing inguinal hernias. Musculoskeletal: Lumbar spine degenerative changes. No aggressive or acute appearing osseous lesions. IMPRESSION: 1. Interval placement of a common bile duct stent with the distal aspect in the descending duodenum. 2. Inflammatory changes about the pancreas, centered about the head and uncinate process, compatible with acute pancreatitis. No fluid collection identified. 3. New small right pleural effusion. 4. 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 Electronically Signed   By: Lovey Newcomer M.D.   On: 03/30/2017 15:09    ASSESSMENT AND PLAN:   Active Problems:   Choledocholithiasis   Jaundice   Acute pancreatitis   1.  Choledocholithiasis The patient had right upper abdominal pain with concern for CBD stones with dilated ducts seen on CT scan  GI consultation - s/p ERCP, ERCP in 1 month to clear bile  duct and remove stent. Liver function tests improved today.  Hold aspirin and Plavix for possible sugery. Surgical consultation requested for possible cholecystectomy after ERCP Surgeon want Plavix off for 5 days, once patient is stable we can discharge and he can follow as outpatient with surgical clinic. Lipase is down to 87. No surgical indications at this time per Dr. Hampton Abbot. Advance diet to full liquid diet and resume aspirin and Plavix. The patient will need a repeat ERCP for a sphincterotomy stone extraction and stent removal per Dr. Allen Norris.   2. Acute pancreatitits. Doscontinued IV fluids Continue supportive management with antiemetics and pain control  3. Hyponatremia, mild due to decreased by mouth intake Improved with normal saline IV, hold HCTZ  Acute renal failure with dehydration due to above, improved with IV fluid support.  4. CAD: Hold aspirin and Plavix Continue lisinopril, metoprolol and statin.  5 Essential hypertension: Continue metoprolol and lisinopril  6. Hypothyroid Continue Synthroid  Fever and leukocytosis due to bacteremia.  GNR per blood culture and CT of the abdomen:  There is evidence of pancreatitis.  No cholecystitis.  No surgical indication at this time per surgeon.  Continue cefepime IV and started Flagyl IV by Dr. Ola Spurr.  Follow-up CBC and blood culture. Per Dr. Ola Spurr, suggest he stay on abx up until he undergoes repeat ERCP with stone extraction and stent removal.   All the records are reviewed and case discussed with Care Management/Social Workerr. Management plans discussed with the patient, his wife and they are in agreement.  CODE STATUS: Full.  TOTAL TIME TAKING CARE OF THIS PATIENT: 36 minutes.  POSSIBLE D/C IN 2 DAYS, DEPENDING ON CLINICAL CONDITION.   Demetrios Loll M.D on 03/31/2017   Between 7am to 6pm - Pager - [phone]  After 6pm go to [email or web address] - [place]  [place]   [phone]  CC: Primary care physician; Idelle Crouch, MD  Note: This dictation was prepared with Dragon dictation along with smaller phrase technology. Any transcriptional errors that result from this process are unintentional.

## 2017-03-31 NOTE — Care Management Important Message (Signed)
Important Message  Patient Details  Name: Jason House MRN: [id number] Date of Birth: 08/11/1940   Medicare Important Message Given:  Yes    Beverly Sessions, RN 03/31/2017, 4:22 PM

## 2017-03-31 NOTE — Progress Notes (Signed)
Pharmacy Antibiotic Note  Jason House is a 77 y.o. male admitted on 03/27/2017 with bacteremia.  Pharmacy has been consulted for Cefepime dosing.  Plan: Will continue with Cefepime 2 gram IV q12h for bacteremia with Crcl 56 ml/min    Height: 5\' 7"  (170.2 cm) Weight: 205 lb 4 oz (93.1 kg) IBW/kg (Calculated) : 66.1  Temp (24hrs), Avg:99 F (37.2 C), Min:98.1 F (36.7 C), Max:101.3 F (38.5 C)  Recent Labs  Lab 03/25/17 1649 03/27/17 1105 03/28/17 0428 03/29/17 0759 03/30/17 0454 03/31/17 0413  WBC 14.4* 14.9*  --  10.9* 12.0* 9.3  CREATININE 1.55* 1.86* 1.57* 1.33* 1.21  --     Estimated Creatinine Clearance: 55.6 mL/min (by C-G formula based on SCr of 1.21 mg/dL).    No Known Allergies  Antimicrobials this admission: cefepime 1/28 >>       >>    Dose adjustments this admission:    Microbiology results: 1/27 BCx: looks like 3 of 4 bottles + for Enterobacteriaceae species   UCx:      Sputum:      MRSA PCR:    Thank you for allowing pharmacy to be a part of this patient's care.  Delaine Hernandez A 03/31/2017 12:31 PM

## 2017-03-31 NOTE — Progress Notes (Signed)
PHARMACY - PHYSICIAN COMMUNICATION CRITICAL VALUE ALERT - BLOOD CULTURE IDENTIFICATION (BCID)  Jason House is an 77 y.o. male who presented to [facility] on 03/27/2017 with a chief complaint of pancreatitis  Assessment:  BCID 2/4 Enterobacteriaceae. (include suspected source if known)  Name of physician (or Provider) Contacted: [place]  Current antibiotics: none  Changes to prescribed antibiotics recommended:  Cefepime added  Results for orders placed or performed during the hospital encounter of 03/27/17  Blood Culture ID Panel (Reflexed) (Collected: 03/30/2017  7:50 AM)  Result Value Ref Range   Enterococcus species NOT DETECTED NOT DETECTED   Vancomycin resistance NOT DETECTED NOT DETECTED   Listeria monocytogenes NOT DETECTED NOT DETECTED   Staphylococcus species NOT DETECTED NOT DETECTED   Staphylococcus aureus NOT DETECTED NOT DETECTED   Methicillin resistance NOT DETECTED NOT DETECTED   Streptococcus species NOT DETECTED NOT DETECTED   Streptococcus agalactiae NOT DETECTED NOT DETECTED   Streptococcus pneumoniae NOT DETECTED NOT DETECTED   Streptococcus pyogenes NOT DETECTED NOT DETECTED   Acinetobacter baumannii NOT DETECTED NOT DETECTED   Enterobacteriaceae species DETECTED (A) NOT DETECTED   Enterobacter cloacae complex NOT DETECTED NOT DETECTED   Escherichia coli NOT DETECTED NOT DETECTED   Klebsiella oxytoca NOT DETECTED NOT DETECTED   Klebsiella pneumoniae NOT DETECTED NOT DETECTED   Proteus species NOT DETECTED NOT DETECTED   Serratia marcescens NOT DETECTED NOT DETECTED   Carbapenem resistance NOT DETECTED NOT DETECTED   Haemophilus influenzae NOT DETECTED NOT DETECTED   Neisseria meningitidis NOT DETECTED NOT DETECTED   Pseudomonas aeruginosa NOT DETECTED NOT DETECTED   Candida albicans NOT DETECTED NOT DETECTED   Candida glabrata NOT DETECTED NOT DETECTED   Candida krusei NOT DETECTED NOT DETECTED   Candida parapsilosis NOT DETECTED NOT DETECTED   Candida  tropicalis NOT DETECTED NOT DETECTED    Jason House 03/31/2017  5:00 AM

## 2017-04-01 MED ORDER — METRONIDAZOLE 500 MG PO TABS: 500.0000 mg | ORAL_TABLET | Freq: Three times a day (TID) | ORAL | 0 refills | Status: DC

## 2017-04-01 MED ORDER — CIPROFLOXACIN HCL 500 MG PO TABS: 500.0000 mg | ORAL_TABLET | Freq: Two times a day (BID) | ORAL | Status: DC

## 2017-04-01 MED ORDER — CIPROFLOXACIN HCL 500 MG PO TABS: 500.0000 mg | ORAL_TABLET | Freq: Two times a day (BID) | ORAL | 0 refills | Status: DC

## 2017-04-01 MED ORDER — PREMIER PROTEIN SHAKE
11.0000 [oz_av] | Freq: Two times a day (BID) | ORAL | Status: DC
  Administered 2017-04-01: 11 [oz_av] via ORAL

## 2017-04-01 MED ORDER — METRONIDAZOLE 500 MG PO TABS
500.0000 mg | ORAL_TABLET | Freq: Three times a day (TID) | ORAL | Status: DC
  Filled 2017-04-01 (×2): qty 1

## 2017-04-01 NOTE — Evaluation (Addendum)
Physical Therapy Evaluation Patient Details Name: Jason House MRN: [id number] DOB: 09-Mar-1940 Today's Date: 04/01/2017   History of Present Illness  Pt admitted for choledocholithiasis.  PMH includes anemia, CAD, glaucoma, ETOH, HLD, Htn and hx of shingles.  Clinical Impression  Pt is a 77 year old male who lives in a mobile home with his wife.  Pt does not traditionally use an AD for mobility but stated recognition that he could benefit from one while he is recovering.  Pt was able to perform transfers and 200 ft ambulation without an AD.  Pt presented with gait deviations and mild LOB's consistent with fall risk during dynamic gait.  Pt presented with WNL strength of UE and LE and reported no sensation deficits.  Static balance activity indicates that pt is at a risk for falls with higher level activity which requires a narrow BOS.  Pt will continue to benefit from skilled PT with focus on balance and proper use of SPC.    Follow Up Recommendations Outpatient PT    Equipment Recommendations       Recommendations for Other Services       Precautions / Restrictions Precautions Precautions: Fall      Mobility  Bed Mobility Overal bed mobility: (Did not observe.  Pt in recliner upon arrival.)                Transfers Overall transfer level: Independent                  Ambulation/Gait Ambulation/Gait assistance: Supervision Ambulation Distance (Feet): 200 Feet       Gait velocity interpretation: at or above normal speed for age/gender General Gait Details: Pt presented with a recipriocal gait pattern using no AD and with good foot clearance and hip and knee flexion.  PT noted bilateral ankle instability and occasional crossover gait.  Pt did not require a rest break but held onto the wall on two occasions for balance.  PT discussed using SPC which pt has at home until he regains strength.  Stairs            Wheelchair Mobility    Modified Rankin  (Stroke Patients Only)       Balance Overall balance assessment: History of Falls;Modified Independent(Pt furniture cruises on occasion to maintain balance. Quiet stance EO: 10 sec, EC: 8 sec with increased a-p sway, tandem: L: 4 sec, R: unable, TUG: 8 sec.)                                           Pertinent Vitals/Pain Pain Assessment: 0-10 Pain Location: Pt reports mild soreness and nagging in stomach but nothing else.    Home Living Family/patient expects to be discharged to:: Private residence Living Arrangements: Spouse/significant other Available Help at Discharge: Family Type of Home: Mobile home Home Access: Ramped entrance     [place]: One level [place]: [place] - single point;Grab bars - tub/shower(Keeps cane in his car in case he needs it.)      Prior Function Level of Independence: Independent               Hand Dominance        Extremity/Trunk Assessment   Upper Extremity Assessment Upper Extremity Assessment: Overall WFL for tasks assessed    Lower Extremity Assessment Lower Extremity Assessment: Overall WFL for tasks assessed  Cervical / Trunk Assessment Cervical / Trunk Assessment: Normal  Communication   Communication: No difficulties  Cognition Arousal/Alertness: Awake/alert Behavior During Therapy: WFL for tasks assessed/performed Overall Cognitive Status: Within Functional Limits for tasks assessed                                        General Comments      Exercises     Assessment/Plan    PT Assessment Patient needs continued PT services  PT Problem List Decreased balance;Decreased knowledge of use of DME       PT Treatment Interventions DME instruction;Therapeutic activities;Gait training;Therapeutic exercise;Balance training;Patient/family education    PT Goals (Current goals can be found in the Care Plan section)  Acute Rehab PT Goals Patient Stated Goal: To return home and  continue general exercise and walking routine PT Goal Formulation: With patient Time For Goal Achievement: 04/15/17 Potential to Achieve Goals: Good    Frequency Min 2X/week   Barriers to discharge        Co-evaluation               AM-PAC PT "6 Clicks" Daily Activity  Outcome Measure Difficulty turning over in bed (including adjusting bedclothes, sheets and blankets)?: None Difficulty moving from lying on back to sitting on the side of the bed? : None Difficulty sitting down on and standing up from a chair with arms (e.g., wheelchair, bedside commode, etc,.)?: None Help needed moving to and from a bed to chair (including a wheelchair)?: None Help needed walking in hospital room?: A Little Help needed climbing 3-5 steps with a railing? : A Little 6 Click Score: 22    End of Session   Activity Tolerance: Patient tolerated treatment well Patient left: in chair;with call bell/phone within reach   PT Visit Diagnosis: Unsteadiness on feet (R26.81);History of falling (Z91.81)    Time: 1400-1420 PT Time Calculation (min) (ACUTE ONLY): 20 min   Charges:   PT Evaluation $PT Eval Low Complexity: 1 Low PT Treatments $Gait Training: 8-22 mins   PT G Codes:   PT G-Codes **NOT FOR INPATIENT CLASS** Functional Assessment Tool Used: AM-PAC 6 Clicks Basic Mobility Functional Limitation: Mobility: Walking and moving around    Roxanne Gates, PT, DPT   Roxanne Gates 04/01/2017, 2:33 PM

## 2017-04-01 NOTE — Progress Notes (Signed)
Discharge order received. Patient is alert and oriented. Vital signs stable . No signs of acute distress. Discharge instructions given. Primary nurse has explained the discharge medications repeatedly to patient. Patient seemed confused after explaining to him several times. I instructed pt. to call me back if confusion arise.

## 2017-04-01 NOTE — Progress Notes (Signed)
[place] INFECTIOUS DISEASE PROGRESS NOTE Date of Admission:  03/27/2017     ID: Jason House is a 77 y.o. male with gallstone pancreatitis, GNR bacteremia Active Problems:   Choledocholithiasis   Jaundice   Acute pancreatitis   Subjective: No fevers, cx with Citrobacter. For DC today  ROS  Eleven systems are reviewed and negative except per hpi  Medications:  Antibiotics Given (last 72 hours)    Date/Time Action Medication Dose Rate   03/30/17 1036 New Bag/Given   cefTRIAXone (ROCEPHIN) 2 g in dextrose 5 % 50 mL IVPB 2 g 100 mL/hr   03/31/17 0038 New Bag/Given   ceFEPIme (MAXIPIME) 1 g in dextrose 5 % 50 mL IVPB 1 g 100 mL/hr   03/31/17 0839 New Bag/Given   ceFEPIme (MAXIPIME) 1 g in dextrose 5 % 50 mL IVPB 1 g 100 mL/hr   03/31/17 1614 New Bag/Given   metroNIDAZOLE (FLAGYL) IVPB 500 mg 500 mg 100 mL/hr   03/31/17 1738 New Bag/Given   ceFEPIme (MAXIPIME) 2 g in dextrose 5 % 50 mL IVPB 2 g 100 mL/hr   03/31/17 2325 New Bag/Given   metroNIDAZOLE (FLAGYL) IVPB 500 mg 500 mg 100 mL/hr   04/01/17 0557 New Bag/Given   ceFEPIme (MAXIPIME) 2 g in dextrose 5 % 50 mL IVPB 2 g 100 mL/hr   04/01/17 0841 New Bag/Given   metroNIDAZOLE (FLAGYL) IVPB 500 mg 500 mg 100 mL/hr     . amLODipine  5 mg Oral Daily  . aspirin  81 mg Oral Daily  . atorvastatin  80 mg Oral Daily  . ciprofloxacin  500 mg Oral BID  . clopidogrel  75 mg Oral Daily  . cyclobenzaprine  10 mg Oral QHS  . dorzolamide-timolol  1 drop Both Eyes BID  . enoxaparin (LOVENOX) injection  40 mg Subcutaneous Q24H  . indomethacin  100 mg Rectal Once  . latanoprost  1 drop Both Eyes QHS  . levothyroxine  50 mcg Oral QAC breakfast  . metoprolol tartrate  12.5 mg Oral BID  . metroNIDAZOLE  500 mg Oral Q8H  . polyethylene glycol  17 g Oral Daily  . protein supplement shake  11 oz Oral BID BM  . vitamin B-12  1,000 mcg Oral Daily    Objective: Vital signs in last 24 hours: Temp:  [99.5 F (37.5 C)-99.7 F (37.6  C)] 99.5 F (37.5 C) (01/29 0554) Pulse Rate:  [93-105] 93 (01/29 0554) Resp:  [20-24] 24 (01/29 0554) BP: (134-148)/(59-60) 134/60 (01/29 0554) SpO2:  [96 %-97 %] 96 % (01/29 0554) Weight:  [92.7 kg (204 lb 6.4 oz)] 92.7 kg (204 lb 6.4 oz) (01/29 0500) Constitutional: He is oriented to person, place, and time. He appears well-developed and well-nourished. Appears dyspneic after bathing HENT: anicteric Mouth/Throat: Oropharynx is clear and moist. No oropharyngeal exudate.  Cardiovascular: Tachy, IRR Pulmonary/Chest: Effort normal and breath sounds normal. No respiratory distress. He has no wheezes.  Abdominal: Soft. Bowel sounds are normal. He exhibits no distension. There is no tenderness.  Lymphadenopathy: He has no cervical adenopathy.  Neurological: He is alert and oriented to person, place, and time.  Skin: Skin is warm and dry. No rash noted. No erythema.  Psychiatric: He has a normal mood and affect. His behavior is normal.     Lab Results Recent Labs    03/30/17 0454 03/31/17 0413  WBC 12.0* 9.3  HGB 10.6* 9.7*  HCT 31.5* 28.5*  NA 134*  --   K 3.8  --  CL 103  --   CO2 25  --   BUN 15  --   CREATININE 1.21  --     Microbiology: Results for orders placed or performed during the hospital encounter of 03/27/17  CULTURE, BLOOD (ROUTINE X 2) w Reflex to ID Panel     Status: Abnormal (Preliminary result)   Collection Time: 03/30/17  7:42 AM  Result Value Ref Range Status   Specimen Description   Final    BLOOD RIGHT ARM Performed at [facility], [address] [place], [place] [address]    Special Requests   Final    Blood Culture adequate volume Performed at [facility], [address], [place], [place] [address]    Culture  Setup Time   Final    GRAM NEGATIVE RODS ANAEROBIC BOTTLE ONLY CRITICAL VALUE NOTED.  VALUE IS CONSISTENT WITH PREVIOUSLY REPORTED AND CALLED VALUE. Performed at [facility], [address]  [place], [place] [address]    Culture (A)  Final    CITROBACTER KOSERI SUSCEPTIBILITIES TO FOLLOW Performed at [facility], [place] [address], [place], [place] [address]    Report Status PENDING  Incomplete  CULTURE, BLOOD (ROUTINE X 2) w Reflex to ID Panel     Status: Abnormal (Preliminary result)   Collection Time: 03/30/17  7:50 AM  Result Value Ref Range Status   Specimen Description   Final    BLOOD RIGHT HAND Performed at [facility], [address] [place], [place] [address]    Special Requests   Final    Blood Culture adequate volume Performed at [facility], [address] [place], [place] [address]    Culture  Setup Time   Final    IN BOTH AEROBIC AND ANAEROBIC BOTTLES GRAM NEGATIVE RODS CRITICAL RESULT CALLED TO, READ BACK BY AND VERIFIED WITH: MATT Industry @ 2214 ON 03/30/2017 BY CAF    Culture (A)  Final    CITROBACTER KOSERI SUSCEPTIBILITIES TO FOLLOW Performed at [facility], [place] [address], [place], [place] [address]    Report Status PENDING  Incomplete  Blood Culture ID Panel (Reflexed)     Status: Abnormal   Collection Time: 03/30/17  7:50 AM  Result Value Ref Range Status   Enterococcus species NOT DETECTED NOT DETECTED Final   Vancomycin resistance NOT DETECTED NOT DETECTED Final   Listeria monocytogenes NOT DETECTED NOT DETECTED Final   Staphylococcus species NOT DETECTED NOT DETECTED Final   Staphylococcus aureus NOT DETECTED NOT DETECTED Final   Methicillin resistance NOT DETECTED NOT DETECTED Final   Streptococcus species NOT DETECTED NOT DETECTED Final   Streptococcus agalactiae NOT DETECTED NOT DETECTED Final   Streptococcus pneumoniae NOT DETECTED NOT DETECTED Final   Streptococcus pyogenes NOT DETECTED NOT DETECTED Final   Acinetobacter baumannii NOT DETECTED NOT DETECTED Final   Enterobacteriaceae species DETECTED (A) NOT DETECTED Final    Comment: Enterobacteriaceae represent a large family of gram negative  bacteria, not a single organism. Refer to culture for further identification. CRITICAL RESULT CALLED TO, READ BACK BY AND VERIFIED WITH: MATT MCBANE @ 2214 ON 03/30/2017 BY CAF    Enterobacter cloacae complex NOT DETECTED NOT DETECTED Final   Escherichia coli NOT DETECTED NOT DETECTED Final   Klebsiella oxytoca NOT DETECTED NOT DETECTED Final   Klebsiella pneumoniae NOT DETECTED NOT DETECTED Final   Proteus species NOT DETECTED NOT DETECTED Final   Serratia marcescens NOT DETECTED NOT DETECTED Final  Carbapenem resistance NOT DETECTED NOT DETECTED Final   Haemophilus influenzae NOT DETECTED NOT DETECTED Final   Neisseria meningitidis NOT DETECTED NOT DETECTED Final   Pseudomonas aeruginosa NOT DETECTED NOT DETECTED Final   Candida albicans NOT DETECTED NOT DETECTED Final   Candida glabrata NOT DETECTED NOT DETECTED Final   Candida krusei NOT DETECTED NOT DETECTED Final   Candida parapsilosis NOT DETECTED NOT DETECTED Final   Candida tropicalis NOT DETECTED NOT DETECTED Final    Comment: Performed at [facility], [address], [place], [place] [address]    Studies/Results: No results found.  Assessment/Plan: Jason House is a 77 y.o. male admitted with gallstone pancreatitis now s/p ERCP with stent placement, with associated Gram negative (citrobacter) bacteremia.  He will need repeat ERCP and stone and stent removal.   Recommendations Can change to ciprofloxacin and oral flagyl and will follow up on culture results. Would suggest 14 days total for now.  I would suggest he stay on abx up until he undergoes repeat ERCP with stone extraction and stent removal.   Hopefully this can be expedited.  I can see in 10-14 days Thank you very much for the consult. Will follow with you.  Leonel Ramsay   04/01/2017, 3:30 PM    Cipro and flagyl x 14 days Fu with me

## 2017-04-01 NOTE — Discharge Summary (Signed)
[facility] [place] NAME: Jason House    MR#:  [id number]  DATE OF BIRTH:  07-07-1940  DATE OF ADMISSION:  03/27/2017   ADMITTING PHYSICIAN: Bettey Costa, MD  DATE OF DISCHARGE: 04/01/2017  PRIMARY CARE PHYSICIAN: Idelle Crouch, MD   ADMISSION DIAGNOSIS:  Choledocholithiasis [K80.50] Hyperbilirubinemia [E80.6] Acute pancreatitis, unspecified complication status, unspecified pancreatitis type [K85.90] DISCHARGE DIAGNOSIS:  Active Problems:   Choledocholithiasis   Jaundice   Acute pancreatitis  SECONDARY DIAGNOSIS:   Past Medical History:  Diagnosis Date  . Anemia   . Coronary artery disease   . Glaucoma   . H/O ETOH abuse   . History of shingles   . Hyperlipidemia   . Hypertension    HOSPITAL COURSE:  1.  Choledocholithiasis The patient had right upper abdominal pain with concern for CBD stones with dilated ducts seen on CT scan  GI consultation - s/p ERCP, ERCP in 1 month to clear bile duct and remove stent. Liver function tests improved. Aspirin and Plavix were hold for possible sugery. Surgical consultation requested for possible cholecystectomy after ERCP Surgeon want Plavix off for 5 days, once patient is stable we can discharge and he can follow as outpatient with surgical clinic. Lipase is down to 87. No surgical indications at this time per Dr. Hampton Abbot. Advanced diet to soft diet and resumed aspirin and Plavix. The patient will need a repeat ERCP for a sphincterotomy stone extraction and stent removal per Dr. Allen Norris.   2. Acute pancreatitits. Doscontinued IV fluids He has been treated with supportive management with antiemetics and pain control  3. Hyponatremia, mild due to decreased by mouth intake Improved with normal saline IV, hold HCTZ  Acute renal failure with dehydration due to above, improved with IV fluid support.  4. CAD: Hold aspirin and Plavix Continue lisinopril, metoprolol and  statin.  5Essential hypertension: Continue metoprolol and hold HCTZ and lisinopril. Follow-up with PCP to resume HCTZ and lisinopril in the future.  6. Hypothyroid Continue Synthroid  Fever and leukocytosis due to bacteremia.  GNR per blood culture and CT of the abdomen:  There is evidence of pancreatitis.  No cholecystitis.  No surgical indication at this time per surgeon.  Continue cefepime IV and started Flagyl IV by Dr. Ola Spurr. Blood culture: CITROBACTER KOSERI. Per Dr. Ola Spurr,  Cipro twice daily and Flagyl every 8 hours p.o. for 14 days and follow-up with him as outpatient.  Discussed with Dr. Ola Spurr. DISCHARGE CONDITIONS:  Stable, discharge to home today. CONSULTS OBTAINED:  Treatment Team:  Leonel Ramsay, MD DRUG ALLERGIES:  No Known Allergies DISCHARGE MEDICATIONS:   Allergies as of 04/01/2017   No Known Allergies     Medication List    STOP taking these medications   hydrochlorothiazide 12.5 MG capsule Commonly known as:  MICROZIDE   lisinopril 40 MG tablet Commonly known as:  PRINIVIL,ZESTRIL     TAKE these medications   amLODipine 5 MG tablet Commonly known as:  NORVASC Take 1 tablet by mouth daily.   aspirin EC 81 MG tablet Take 81 mg by mouth daily.   atorvastatin 80 MG tablet Commonly known as:  LIPITOR Take 80 mg by mouth daily.   ciprofloxacin 500 MG tablet Commonly known as:  CIPRO Take 1 tablet (500 mg total) by mouth 2 (two) times daily.   clopidogrel 75 MG tablet Commonly known as:  PLAVIX Take 75 mg by mouth daily.   cyclobenzaprine 10 MG tablet Commonly  known as:  FLEXERIL TAKE 1 TABLET BY MOUTH NIGHTLY   dorzolamide-timolol 22.3-6.8 MG/ML ophthalmic solution Commonly known as:  COSOPT Place 1 drop into both eyes 2 (two) times daily.   latanoprost 0.005 % ophthalmic solution Commonly known as:  XALATAN Place 1 drop into both eyes at bedtime.   levothyroxine 50 MCG tablet Commonly known as:  SYNTHROID,  LEVOTHROID Take 1 tablet by mouth daily.   metoprolol tartrate 25 MG tablet Commonly known as:  LOPRESSOR 12.5 mg 2 (two) times daily.   metroNIDAZOLE 500 MG tablet Commonly known as:  FLAGYL Take 1 tablet (500 mg total) by mouth every 8 (eight) hours.   nitroGLYCERIN 0.4 MG SL tablet Commonly known as:  NITROSTAT Place 0.4 mg under the tongue every 5 (five) minutes as needed.   vitamin B-12 1000 MCG tablet Commonly known as:  CYANOCOBALAMIN Take 1,000 mcg by mouth daily.        DISCHARGE INSTRUCTIONS:  See AVS.  If you experience worsening of your admission symptoms, develop shortness of breath, life threatening emergency, suicidal or homicidal thoughts you must seek medical attention immediately by calling 911 or calling your MD immediately  if symptoms less severe.  You Must read complete instructions/literature along with all the possible adverse reactions/side effects for all the Medicines you take and that have been prescribed to you. Take any new Medicines after you have completely understood and accpet all the possible adverse reactions/side effects.   Please note  You were cared for by a hospitalist during your hospital stay. If you have any questions about your discharge medications or the care you received while you were in the hospital after you are discharged, you can call the unit and asked to speak with the hospitalist on call if the hospitalist that took care of you is not available. Once you are discharged, your primary care physician will handle any further medical issues. Please note that NO REFILLS for any discharge medications will be authorized once you are discharged, as it is imperative that you return to your primary care physician (or establish a relationship with a primary care physician if you do not have one) for your aftercare needs so that they can reassess your need for medications and monitor your lab values.    On the day of Discharge:  VITAL  SIGNS:  Blood pressure 134/60, pulse 93, temperature 99.5 F (37.5 C), temperature source Oral, resp. rate (!) 24, height 5\' 7"  (1.702 m), weight 204 lb 6.4 oz (92.7 kg), SpO2 96 %. PHYSICAL EXAMINATION:  GENERAL:  77 y.o.-year-old patient lying in the bed with no acute distress.  EYES: Pupils equal, round, reactive to light and accommodation. No scleral icterus. Extraocular muscles intact.  HEENT: Head atraumatic, normocephalic. Oropharynx and nasopharynx clear.  NECK:  Supple, no jugular venous distention. No thyroid enlargement, no tenderness.  LUNGS: Normal breath sounds bilaterally, no wheezing, rales,rhonchi or crepitation. No use of accessory muscles of respiration.  CARDIOVASCULAR: S1, S2 normal. No murmurs, rubs, or gallops.  ABDOMEN: Soft, non-tender, non-distended. Bowel sounds present. No organomegaly or mass.  EXTREMITIES: No pedal edema, cyanosis, or clubbing.  NEUROLOGIC: Cranial nerves II through XII are intact. Muscle strength 5/5 in all extremities. Sensation intact. Gait not checked.  PSYCHIATRIC: The patient is alert and oriented x 3.  SKIN: No obvious rash, lesion, or ulcer.  DATA REVIEW:   CBC Recent Labs  Lab 03/31/17 0413  WBC 9.3  HGB 9.7*  HCT 28.5*  PLT 199  Chemistries  Recent Labs  Lab 03/30/17 0454 03/31/17 0413  NA 134*  --   K 3.8  --   CL 103  --   CO2 25  --   GLUCOSE 121*  --   BUN 15  --   CREATININE 1.21  --   CALCIUM 8.4*  --   AST 73*  --   ALT 88*  --   ALKPHOS 86  --   BILITOT 2.6* 1.3*     Microbiology Results  Results for orders placed or performed during the hospital encounter of 03/27/17  CULTURE, BLOOD (ROUTINE X 2) w Reflex to ID Panel     Status: Abnormal (Preliminary result)   Collection Time: 03/30/17  7:42 AM  Result Value Ref Range Status   Specimen Description   Final    BLOOD RIGHT ARM Performed at [facility], [address] [place], [place] [address]    Special Requests   Final    Blood  Culture adequate volume Performed at [facility], [address] [place], [place] [address]    Culture  Setup Time   Final    GRAM NEGATIVE RODS ANAEROBIC BOTTLE ONLY CRITICAL VALUE NOTED.  VALUE IS CONSISTENT WITH PREVIOUSLY REPORTED AND CALLED VALUE. Performed at [facility], [address], [place], [place] [address]    Culture (A)  Final    CITROBACTER KOSERI SUSCEPTIBILITIES TO FOLLOW Performed at [facility], [place] [address] [place], [place] [address]    Report Status PENDING  Incomplete  CULTURE, BLOOD (ROUTINE X 2) w Reflex to ID Panel     Status: Abnormal (Preliminary result)   Collection Time: 03/30/17  7:50 AM  Result Value Ref Range Status   Specimen Description   Final    BLOOD RIGHT HAND Performed at [facility], [address], [place], [place] [address]    Special Requests   Final    Blood Culture adequate volume Performed at [facility], [address] [place], [place] [address]    Culture  Setup Time   Final    IN BOTH AEROBIC AND ANAEROBIC BOTTLES GRAM NEGATIVE RODS CRITICAL RESULT CALLED TO, READ BACK BY AND VERIFIED WITH: MATT [place] @ 2214 ON 03/30/2017 BY CAF    Culture (A)  Final    CITROBACTER KOSERI SUSCEPTIBILITIES TO FOLLOW Performed at [facility], [place] [address], [place],  [address]    Report Status PENDING  Incomplete  Blood Culture ID Panel (Reflexed)     Status: Abnormal   Collection Time: 03/30/17  7:50 AM  Result Value Ref Range Status   Enterococcus species NOT DETECTED NOT DETECTED Final   Vancomycin resistance NOT DETECTED NOT DETECTED Final   Listeria monocytogenes NOT DETECTED NOT DETECTED Final   Staphylococcus species NOT DETECTED NOT DETECTED Final   Staphylococcus aureus NOT DETECTED NOT DETECTED Final   Methicillin resistance NOT DETECTED NOT DETECTED Final   Streptococcus species NOT DETECTED NOT DETECTED Final   Streptococcus agalactiae NOT DETECTED NOT  DETECTED Final   Streptococcus pneumoniae NOT DETECTED NOT DETECTED Final   Streptococcus pyogenes NOT DETECTED NOT DETECTED Final   Acinetobacter baumannii NOT DETECTED NOT DETECTED Final   Enterobacteriaceae species DETECTED (A) NOT DETECTED Final    Comment: Enterobacteriaceae represent a large family of gram negative bacteria, not a single organism. Refer to culture for further identification. CRITICAL RESULT CALLED TO, READ BACK BY AND VERIFIED WITH: MATT [place] @ 2214 ON 03/30/2017  BY CAF    Enterobacter cloacae complex NOT DETECTED NOT DETECTED Final   Escherichia coli NOT DETECTED NOT DETECTED Final   Klebsiella oxytoca NOT DETECTED NOT DETECTED Final   Klebsiella pneumoniae NOT DETECTED NOT DETECTED Final   Proteus species NOT DETECTED NOT DETECTED Final   Serratia marcescens NOT DETECTED NOT DETECTED Final   Carbapenem resistance NOT DETECTED NOT DETECTED Final   Haemophilus influenzae NOT DETECTED NOT DETECTED Final   Neisseria meningitidis NOT DETECTED NOT DETECTED Final   Pseudomonas aeruginosa NOT DETECTED NOT DETECTED Final   Candida albicans NOT DETECTED NOT DETECTED Final   Candida glabrata NOT DETECTED NOT DETECTED Final   Candida krusei NOT DETECTED NOT DETECTED Final   Candida parapsilosis NOT DETECTED NOT DETECTED Final   Candida tropicalis NOT DETECTED NOT DETECTED Final    Comment: Performed at [facility], [address] [place], [place] [address]    RADIOLOGY:  No results found.   Management plans discussed with the patient, family and they are in agreement.  CODE STATUS: Full Code   TOTAL TIME TAKING CARE OF THIS PATIENT: 35 minutes.    Demetrios Loll M.D on 04/01/2017 at 3:16 PM  Between 7am to 6pm - Pager - [phone]  After 6pm go to [email or web address] - Proofreader  [facility]  Office  [phone]  CC: Primary care physician; Idelle Crouch, MD   Note: This dictation was prepared with Dragon  dictation along with smaller phrase technology. Any transcriptional errors that result from this process are unintentional.

## 2017-04-02 ENCOUNTER — Telehealth: Payer: Self-pay | Admitting: General Practice

## 2017-04-02 LAB — CULTURE, BLOOD (ROUTINE X 2)
SPECIAL REQUESTS: ADEQUATE
SPECIAL REQUESTS: ADEQUATE

## 2017-04-02 NOTE — Telephone Encounter (Signed)
Patients wife is calling has some questions about the Plavix. Please call and advise.

## 2017-04-03 ENCOUNTER — Telehealth: Payer: Self-pay | Admitting: Gastroenterology

## 2017-04-03 ENCOUNTER — Other Ambulatory Visit: Payer: Self-pay

## 2017-04-03 DIAGNOSIS — R17 Unspecified jaundice: Secondary | ICD-10-CM

## 2017-04-03 NOTE — Telephone Encounter (Signed)
The patient currently start his Plavix now but should have his ERCP with the next month to have the stent removed and the stone extracted when his cardiologist clears him to stop the Plavix for 5 days.

## 2017-04-03 NOTE — Telephone Encounter (Signed)
Patient instructed to call Dr.Wohl's office for instructions regarding Plavix. Number provided for Dr.Wohl"s office.

## 2017-04-03 NOTE — Telephone Encounter (Signed)
Patient called and wants to know when to go back on Plavix?

## 2017-04-04 ENCOUNTER — Telehealth: Payer: Self-pay | Admitting: Surgery

## 2017-04-04 NOTE — Telephone Encounter (Signed)
I have faxed over Cardiac Clearance and Anti-Coagulant Clearance to Dr April Manson office per the request of Dr Laurelyn Sickle nurse. Patient was seen in the hospital and has a hospital follow up with Dr Hampton Abbot to discuss possible laparoscopic cholecystectomy on 04/16/17.  This was faxed to [phone].

## 2017-04-07 NOTE — Telephone Encounter (Signed)
Cardiac Clearance and Anti-Coagulant Clearance have been obtained and approved at this time. Clearances can be found in patient's chart under Media Tab.

## 2017-04-08 DIAGNOSIS — H409 Unspecified glaucoma: Secondary | ICD-10-CM | POA: Insufficient documentation

## 2017-04-08 DIAGNOSIS — N189 Chronic kidney disease, unspecified: Secondary | ICD-10-CM | POA: Insufficient documentation

## 2017-04-08 DIAGNOSIS — I2510 Atherosclerotic heart disease of native coronary artery without angina pectoris: Secondary | ICD-10-CM | POA: Insufficient documentation

## 2017-04-11 ENCOUNTER — Telehealth: Payer: Self-pay

## 2017-04-11 NOTE — Telephone Encounter (Signed)
Spoke with pt's wife and advised her we receive the blood thinner clearance for his plavix. Per Dr. Doy Hutching, pt has been advised to stop 3 days prior to ERCP. She stated she has been told 5 days and they will just do that. See clearance in Media if needed.

## 2017-04-15 DIAGNOSIS — R8290 Unspecified abnormal findings in urine: Secondary | ICD-10-CM | POA: Diagnosis not present

## 2017-04-15 DIAGNOSIS — R7881 Bacteremia: Secondary | ICD-10-CM | POA: Diagnosis not present

## 2017-04-15 DIAGNOSIS — I2510 Atherosclerotic heart disease of native coronary artery without angina pectoris: Secondary | ICD-10-CM | POA: Diagnosis not present

## 2017-04-15 DIAGNOSIS — K851 Biliary acute pancreatitis without necrosis or infection: Secondary | ICD-10-CM | POA: Diagnosis not present

## 2017-04-15 DIAGNOSIS — Z79899 Other long term (current) drug therapy: Secondary | ICD-10-CM | POA: Diagnosis not present

## 2017-04-16 ENCOUNTER — Ambulatory Visit (INDEPENDENT_AMBULATORY_CARE_PROVIDER_SITE_OTHER): Payer: PPO | Admitting: Surgery

## 2017-04-16 ENCOUNTER — Encounter: Payer: Self-pay | Admitting: Surgery

## 2017-04-16 VITALS — BP 124/65 | HR 106 | Temp 97.7°F | Ht 67.0 in | Wt 150.4 lb

## 2017-04-16 DIAGNOSIS — K851 Biliary acute pancreatitis without necrosis or infection: Secondary | ICD-10-CM | POA: Diagnosis not present

## 2017-04-16 NOTE — Progress Notes (Signed)
04/16/2017  History of Present Illness: Jason House is a 77 y.o. male who presents with recent history of gallstone pancreatitis.  He had an ERCP with stent placement only due to his taking Plavix.  He presents for follow up.  He reports that since his discharge he has been feeling much better and denies any further abdominal pain.  He is eating although his appetite is not fully back yet.  Denies any nausea or vomiting or worsening pain.  Denies any fevers or chills.  He is due for redo ERCP next week with Dr. Allen Norris and already knows to stop his Plavix prior to the procedure.  Past Medical History: Past Medical History:  Diagnosis Date  . Anemia   . Coronary artery disease   . Glaucoma   . H/O ETOH abuse   . History of shingles   . Hyperlipidemia   . Hypertension      Past Surgical History: Past Surgical History:  Procedure Laterality Date  . COLONOSCOPY WITH PROPOFOL N/A 09/23/2014   Procedure: COLONOSCOPY WITH PROPOFOL;  Surgeon: Josefine Class, MD;  Location[facility] ENDOSCOPY;  Service: Endoscopy;  Laterality: N/A;  . CORONARY ANGIOPLASTY    . CORONARY ARTERY BYPASS GRAFT    . EYE SURGERY      Home Medications: Prior to Admission medications   Medication Sig Start Date End Date Taking? Authorizing Provider  amLODipine (NORVASC) 5 MG tablet Take 1 tablet by mouth daily. 02/01/17  Yes [provider]  aspirin EC 81 MG tablet Take 81 mg by mouth daily.   Yes [provider]  atorvastatin (LIPITOR) 80 MG tablet Take 80 mg by mouth daily.   Yes [provider]  ciprofloxacin (CIPRO) 500 MG tablet Take 1 tablet (500 mg total) by mouth 2 (two) times daily. 04/01/17  Yes Demetrios Loll, MD  clopidogrel (PLAVIX) 75 MG tablet Take 75 mg by mouth daily.   Yes [provider]  cyclobenzaprine (FLEXERIL) 10 MG tablet TAKE 1 TABLET BY MOUTH NIGHTLY 10/09/16  Yes [provider]  dorzolamide-timolol (COSOPT) 22.3-6.8 MG/ML ophthalmic solution Place 1  drop into both eyes 2 (two) times daily.   Yes [provider]  latanoprost (XALATAN) 0.005 % ophthalmic solution Place 1 drop into both eyes at bedtime.   Yes [provider]  levothyroxine (SYNTHROID, LEVOTHROID) 50 MCG tablet Take 1 tablet by mouth daily. 01/04/17  Yes [provider]  metoprolol succinate (TOPROL-XL) 25 MG 24 hr tablet  04/09/17  Yes [provider]  metoprolol tartrate (LOPRESSOR) 25 MG tablet 12.5 mg 2 (two) times daily.  11/29/16  Yes [provider]  metroNIDAZOLE (FLAGYL) 500 MG tablet Take 1 tablet (500 mg total) by mouth every 8 (eight) hours. 04/01/17  Yes Demetrios Loll, MD  nitroGLYCERIN (NITROSTAT) 0.4 MG SL tablet Place 0.4 mg under the tongue every 5 (five) minutes as needed.    Yes [provider]  vitamin B-12 (CYANOCOBALAMIN) 1000 MCG tablet Take 1,000 mcg by mouth daily.   Yes [provider]    Allergies: No Known Allergies  Review of Systems: Review of Systems  Constitutional: Negative for chills and fever.  Respiratory: Negative for shortness of breath.   Cardiovascular: Negative for chest pain.  Gastrointestinal: Negative for abdominal pain, nausea and vomiting.    Physical Exam BP 124/65   Pulse (!) 106   Temp 97.7 F (36.5 C) (Oral)   Ht 5\' 7"  (1.702 m)   Wt 68.2 kg (150 lb 6.4  oz)   BMI 23.56 kg/m  CONSTITUTIONAL: No acute distress RESPIRATORY:  Lungs are clear, and breath sounds are equal bilaterally. Normal respiratory effort without pathologic use of accessory muscles. CARDIOVASCULAR: Heart is regular without murmurs, gallops, or rubs. GI: The abdomen is soft, nondistended, nontender to palpation.  NEUROLOGIC:  Motor and sensation is grossly normal.  Cranial nerves are grossly intact. PSYCH:  Alert and oriented to person, place and time. Affect is normal.  Labs/Imaging: None recently  Assessment and Plan: This is a 77 y.o. male who presents with a recent history of gallstone  pancreatitis.  Discussed with the patient that next week he will have an ERCP there which time hopefully his stent will be removed and any remaining stones will be retrieved.  Given his severe pancreatitis episode during his hospital stay, we would want to wait still a few more weeks for all this to calm down in order to be able to proceed with surgery more safely.  At this point discussing with the patient with thinking about attempting laparoscopic cholecystectomy towards the third week of March.  We will follow-up with the patient during the first week of March to make sure that he is still doing well and will discuss surgery further as well as went to stop his Plavix again.  Patient is in agreement with this plan and all of his questions have been answered.  Face-to-face time spent with the patient and care providers was 25 minutes, with more than 50% of the time spent counseling, educating, and coordinating care of the patient.     Melvyn Neth, [place]

## 2017-04-16 NOTE — Patient Instructions (Signed)
Please see your follow up appointment listed below.   You have requested to have your gallbladder removed.  To be discussed  05/07/17 .  You will most likely be out of work 1-2 weeks for this surgery. You will return after your post-op appointment with a lifting restriction for approximately 4 more weeks.  You will be able to eat anything you would like to following surgery. But, start by eating a bland diet and advance this as tolerated. The Gallbladder diet is below, please go as closely by this diet as possible prior to surgery to avoid any further attacks.  Please see the (blue)pre-care form that you have been given today. If you have any questions, please call our office.  Laparoscopic Cholecystectomy Laparoscopic cholecystectomy is surgery to remove the gallbladder. The gallbladder is located in the upper right part of the abdomen, behind the liver. It is a storage sac for bile, which is produced in the liver. Bile aids in the digestion and absorption of fats. Cholecystectomy is often done for inflammation of the gallbladder (cholecystitis). This condition is usually caused by a buildup of gallstones (cholelithiasis) in the gallbladder. Gallstones can block the flow of bile, and that can result in inflammation and pain. In severe cases, emergency surgery may be required. If emergency surgery is not required, you will have time to prepare for the procedure. Laparoscopic surgery is an alternative to open surgery. Laparoscopic surgery has a shorter recovery time. Your common bile duct may also need to be examined during the procedure. If stones are found in the common bile duct, they may be removed. LET [facility] CARE PROVIDER KNOW ABOUT:  Any allergies you have.  All medicines you are taking, including vitamins, herbs, eye drops, creams, and over-the-counter medicines.  Previous problems you or members of your family have had with the use of anesthetics.  Any blood disorders you  have.  Previous surgeries you have had.    Any medical conditions you have. RISKS AND COMPLICATIONS Generally, this is a safe procedure. However, problems may occur, including:  Infection.  Bleeding.  Allergic reactions to medicines.  Damage to other structures or organs.  A stone remaining in the common bile duct.  A bile leak from the cyst duct that is clipped when your gallbladder is removed.  The need to convert to open surgery, which requires a larger incision in the abdomen. This may be necessary if your surgeon thinks that it is not safe to continue with a laparoscopic procedure. BEFORE THE PROCEDURE  Ask your health care provider about:  Changing or stopping your regular medicines. This is especially important if you are taking diabetes medicines or blood thinners.  Taking medicines such as aspirin and ibuprofen. These medicines can thin your blood. Do not take these medicines before your procedure if your health care provider instructs you not to.  Follow instructions from your health care provider about eating or drinking restrictions.  Let your health care provider know if you develop a cold or an infection before surgery.  Plan to have someone take you home after the procedure.  Ask your health care provider how your surgical site will be marked or identified.  You may be given antibiotic medicine to help prevent infection. PROCEDURE  To reduce your risk of infection:  Your health care team will wash or sanitize their hands.  Your skin will be washed with soap.  An IV tube may be inserted into one of your veins.  You will be  given a medicine to make you fall asleep (general anesthetic).  A breathing tube will be placed in your mouth.  The surgeon will make several small cuts (incisions) in your abdomen.  A thin, lighted tube (laparoscope) that has a tiny camera on the end will be inserted through one of the small incisions. The camera on the  laparoscope will send a picture to a TV screen (monitor) in the operating room. This will give the surgeon a good view inside your abdomen.  A gas will be pumped into your abdomen. This will expand your abdomen to give the surgeon more room to perform the surgery.  Other tools that are needed for the procedure will be inserted through the other incisions. The gallbladder will be removed through one of the incisions.  After your gallbladder has been removed, the incisions will be closed with stitches (sutures), staples, or skin glue.  Your incisions may be covered with a bandage (dressing). The procedure may vary among health care providers and hospitals. AFTER THE PROCEDURE  Your blood pressure, heart rate, breathing rate, and blood oxygen level will be monitored often until the medicines you were given have worn off.  You will be given medicines as needed to control your pain.   This information is not intended to replace advice given to you by your health care provider. Make sure you discuss any questions you have with your health care provider.   Document Released: 02/18/2005 Document Revised: 11/09/2014 Document Reviewed: 09/30/2012 Elsevier Interactive Patient Education 2016 [place] Diet for Gallbladder Conditions A low-fat diet can be helpful if you have pancreatitis or a gallbladder condition. With these conditions, your pancreas and gallbladder have trouble digesting fats. A healthy eating plan with less fat will help rest your pancreas and gallbladder and reduce your symptoms. WHAT DO I NEED TO KNOW ABOUT THIS DIET?  Eat a low-fat diet.  Reduce your fat intake to less than 20-30% of your total daily calories. This is less than 50-60 g of fat per day.  Remember that you need some fat in your diet. Ask your dietician what your daily goal should be.  Choose nonfat and low-fat healthy foods. Look for the words "nonfat," "low fat," or "fat free."  As a guide,  look on the label and choose foods with less than 3 g of fat per serving. Eat only one serving.  Avoid alcohol.  Do not smoke. If you need help quitting, talk with your health care provider.  Eat small frequent meals instead of three large heavy meals. WHAT FOODS CAN I EAT? Grains Include healthy grains and starches such as potatoes, wheat bread, fiber-rich cereal, and brown rice. Choose whole grain options whenever possible. In adults, whole grains should account for 45-65% of your daily calories.  Fruits and Vegetables Eat plenty of fruits and vegetables. Fresh fruits and vegetables add fiber to your diet. Meats and Other Protein Sources Eat lean meat such as chicken and pork. Trim any fat off of meat before cooking it. Eggs, fish, and beans are other sources of protein. In adults, these foods should account for 10-35% of your daily calories. Dairy Choose low-fat milk and dairy options. Dairy includes fat and protein, as well as calcium.  Fats and Oils Limit high-fat foods such as fried foods, sweets, baked goods, sugary drinks.  Other Creamy sauces and condiments, such as mayonnaise, can add extra fat. Think about whether or not you need to use them, or use smaller  amounts or low fat options. WHAT FOODS ARE NOT RECOMMENDED?  High fat foods, such as:  [employer].  Ice cream.  [place] toast.  Sweet rolls.  Pizza.  Cheese bread.  Foods covered with batter, butter, creamy sauces, or cheese.  Fried foods.  Sugary drinks and desserts.  Foods that cause gas or bloating   This information is not intended to replace advice given to you by your health care provider. Make sure you discuss any questions you have with your health care provider.   Document Released: 02/23/2013 Document Reviewed: 02/23/2013 Elsevier Interactive Patient Education [employer].

## 2017-04-18 DIAGNOSIS — K8050 Calculus of bile duct without cholangitis or cholecystitis without obstruction: Secondary | ICD-10-CM | POA: Diagnosis not present

## 2017-04-18 DIAGNOSIS — R7881 Bacteremia: Secondary | ICD-10-CM | POA: Diagnosis not present

## 2017-04-18 DIAGNOSIS — K851 Biliary acute pancreatitis without necrosis or infection: Secondary | ICD-10-CM | POA: Diagnosis not present

## 2017-04-21 ENCOUNTER — Encounter: Payer: Self-pay | Admitting: Emergency Medicine

## 2017-04-22 ENCOUNTER — Ambulatory Visit
Admission: RE | Admit: 2017-04-22 | Discharge: 2017-04-22 | Disposition: A | Discharge status: Home / Self Care | Payer: PPO | Source: Ambulatory Visit | Attending: Gastroenterology | Admitting: Gastroenterology

## 2017-04-22 ENCOUNTER — Ambulatory Visit: Payer: PPO | Admitting: Gastroenterology

## 2017-04-22 ENCOUNTER — Ambulatory Visit: Payer: PPO

## 2017-04-22 ENCOUNTER — Telehealth: Payer: Self-pay | Admitting: Gastroenterology

## 2017-04-22 ENCOUNTER — Ambulatory Visit: Payer: PPO | Admitting: Certified Registered Nurse Anesthetist

## 2017-04-22 ENCOUNTER — Encounter
Admission: RE | Disposition: A | Discharge status: Home / Self Care | Payer: Self-pay | Source: Ambulatory Visit | Attending: Gastroenterology

## 2017-04-22 ENCOUNTER — Encounter: Payer: Self-pay | Admitting: Certified Registered Nurse Anesthetist

## 2017-04-22 DIAGNOSIS — I129 Hypertensive chronic kidney disease with stage 1 through stage 4 chronic kidney disease, or unspecified chronic kidney disease: Secondary | ICD-10-CM | POA: Diagnosis not present

## 2017-04-22 DIAGNOSIS — K8050 Calculus of bile duct without cholangitis or cholecystitis without obstruction: Secondary | ICD-10-CM

## 2017-04-22 DIAGNOSIS — I2510 Atherosclerotic heart disease of native coronary artery without angina pectoris: Secondary | ICD-10-CM | POA: Insufficient documentation

## 2017-04-22 DIAGNOSIS — Z79890 Hormone replacement therapy (postmenopausal): Secondary | ICD-10-CM | POA: Diagnosis not present

## 2017-04-22 DIAGNOSIS — Z87891 Personal history of nicotine dependence: Secondary | ICD-10-CM | POA: Insufficient documentation

## 2017-04-22 DIAGNOSIS — H409 Unspecified glaucoma: Secondary | ICD-10-CM | POA: Diagnosis not present

## 2017-04-22 DIAGNOSIS — R17 Unspecified jaundice: Secondary | ICD-10-CM | POA: Diagnosis not present

## 2017-04-22 DIAGNOSIS — E785 Hyperlipidemia, unspecified: Secondary | ICD-10-CM | POA: Insufficient documentation

## 2017-04-22 DIAGNOSIS — Z79899 Other long term (current) drug therapy: Secondary | ICD-10-CM | POA: Insufficient documentation

## 2017-04-22 DIAGNOSIS — Z7902 Long term (current) use of antithrombotics/antiplatelets: Secondary | ICD-10-CM | POA: Diagnosis not present

## 2017-04-22 DIAGNOSIS — Z4659 Encounter for fitting and adjustment of other gastrointestinal appliance and device: Secondary | ICD-10-CM | POA: Diagnosis not present

## 2017-04-22 DIAGNOSIS — Z7982 Long term (current) use of aspirin: Secondary | ICD-10-CM | POA: Insufficient documentation

## 2017-04-22 DIAGNOSIS — Z951 Presence of aortocoronary bypass graft: Secondary | ICD-10-CM | POA: Diagnosis not present

## 2017-04-22 DIAGNOSIS — I739 Peripheral vascular disease, unspecified: Secondary | ICD-10-CM | POA: Insufficient documentation

## 2017-04-22 DIAGNOSIS — D649 Anemia, unspecified: Secondary | ICD-10-CM | POA: Diagnosis not present

## 2017-04-22 DIAGNOSIS — N189 Chronic kidney disease, unspecified: Secondary | ICD-10-CM | POA: Diagnosis not present

## 2017-04-22 DIAGNOSIS — Z9689 Presence of other specified functional implants: Secondary | ICD-10-CM | POA: Diagnosis not present

## 2017-04-22 DIAGNOSIS — I10 Essential (primary) hypertension: Secondary | ICD-10-CM | POA: Insufficient documentation

## 2017-04-22 HISTORY — PX: ERCP: SHX5425

## 2017-04-22 SURGERY — ERCP, WITH INTERVENTION IF INDICATED
Anesthesia: General

## 2017-04-22 MED ORDER — FENTANYL CITRATE (PF) 100 MCG/2ML IJ SOLN
INTRAMUSCULAR | Status: AC
  Filled 2017-04-22: qty 2

## 2017-04-22 MED ORDER — LIDOCAINE HCL (PF) 1 % IJ SOLN
INTRAMUSCULAR | Status: AC
  Administered 2017-04-22: 0.3 mL
  Filled 2017-04-22: qty 2

## 2017-04-22 MED ORDER — PROPOFOL 500 MG/50ML IV EMUL
INTRAVENOUS | Status: AC
  Filled 2017-04-22: qty 50

## 2017-04-22 MED ORDER — PROPOFOL 500 MG/50ML IV EMUL
INTRAVENOUS | Status: DC | PRN
  Administered 2017-04-22: 125 ug/kg/min via INTRAVENOUS

## 2017-04-22 MED ORDER — LIDOCAINE HCL (PF) 2 % IJ SOLN
INTRAMUSCULAR | Status: AC
  Filled 2017-04-22: qty 10

## 2017-04-22 MED ORDER — PROPOFOL 10 MG/ML IV BOLUS
INTRAVENOUS | Status: DC | PRN
  Administered 2017-04-22: 80 mg via INTRAVENOUS

## 2017-04-22 MED ORDER — LIDOCAINE HCL (CARDIAC) 20 MG/ML IV SOLN
INTRAVENOUS | Status: DC | PRN
  Administered 2017-04-22: 50 mg via INTRATRACHEAL

## 2017-04-22 MED ORDER — SODIUM CHLORIDE 0.9 % IV SOLN
INTRAVENOUS | Status: DC
  Administered 2017-04-22: 1000 mL via INTRAVENOUS

## 2017-04-22 MED ORDER — FENTANYL CITRATE (PF) 100 MCG/2ML IJ SOLN
INTRAMUSCULAR | Status: DC | PRN
  Administered 2017-04-22: 25 ug via INTRAVENOUS

## 2017-04-22 MED ORDER — INDOMETHACIN 50 MG RE SUPP
RECTAL | Status: AC
  Administered 2017-04-22: 50 mg
  Filled 2017-04-22: qty 2

## 2017-04-22 NOTE — Telephone Encounter (Signed)
Megan Hayduk called today & l/m patient had ERCP done BY DR Allen Norris today 04-22-17.They woul like to know which medications he can start back taking.Please call.

## 2017-04-22 NOTE — Op Note (Addendum)
[facility] Patient Name: Jason House Procedure Date: 04/22/2017 10:54 AM MRN: [id number] Account #: [id number] Date of Birth: [birth year] Admit Type: Outpatient Age: 77 Room: [facility] ENDO ROOM 4 Gender: Male Note Status: Finalized Procedure:            ERCP Indications:          Bile duct stone(s), Stent removal Providers:            Lucilla Lame MD, MD Referring MD:         Leonie Douglas. Doy Hutching, MD (Referring MD) Medicines:            Propofol per Anesthesia Complications:        No immediate complications. Procedure:            Pre-Anesthesia Assessment:                       - Prior to the procedure, a History and Physical was                        performed, and patient medications and allergies were                        reviewed. The patient's tolerance of previous                        anesthesia was also reviewed. The risks and benefits of                        the procedure and the sedation options and risks were                        discussed with the patient. All questions were                        answered, and informed consent was obtained. Prior                        Anticoagulants: The patient has taken Plavix                        (clopidogrel). ASA Grade Assessment: II - A patient                        with mild systemic disease. After reviewing the risks                        and benefits, the patient was deemed in satisfactory                        condition to undergo the procedure.                       After obtaining informed consent, the scope was passed                        under direct vision. Throughout the procedure, the                        patient's blood pressure, pulse, and oxygen saturations  were monitored continuously. The Duodenoscope was                        introduced through the mouth, and used to inject                        contrast into and used to inject contrast into  the bile                        duct. The ERCP was accomplished without difficulty. The                        patient tolerated the procedure well. Findings:      A scout film of the abdomen was obtained. One stent ending in the main       bile duct was seen. One plastic stent originating in the biliary tree       was emerging from the major papilla. A wire was passed into the biliary       tree. Biliary sphincterotomy was made with a traction (standard)       sphincterotome using ERBE electrocautery. The sphincterotomy oozed       blood. One stent was removed from the biliary tree using a snare. The       bile duct was deeply cannulated with the short-nosed traction       sphincterotome. Contrast was injected. I personally interpreted the bile       duct images. There was brisk flow of contrast through the ducts. Image       quality was excellent. Contrast extended to the entire biliary tree. The       biliary tree was swept with a 15 mm balloon starting at the bifurcation.       Sludge was swept from the duct. Impression:           - One stent from the biliary tree was seen in the major                        papilla.                       - A biliary sphincterotomy was performed.                       - One stent was removed from the biliary tree.                       - The biliary tree was swept and sludge was found. Recommendation:       - Discharge patient to home.                       - Clear liquid diet today.                       - Watch for pancreatitis, bleeding, perforation, and                        cholangitis. Procedure Code(s):    --- Professional ---                       [phone], Endoscopic retrograde cholangiopancreatography                        (  ERCP); with removal of foreign body(s) or stent(s)                        from biliary/pancreatic duct(s)                       43264, Endoscopic retrograde cholangiopancreatography                        (ERCP); with  removal of calculi/debris from                        biliary/pancreatic duct(s)                       43262, Endoscopic retrograde cholangiopancreatography                        (ERCP); with sphincterotomy/papillotomy                       [phone], Endoscopic catheterization of the biliary ductal                        system, radiological supervision and interpretation Diagnosis Code(s):    --- Professional ---                       K80.50, Calculus of bile duct without cholangitis or                        cholecystitis without obstruction                       Z46.59, Encounter for fitting and adjustment of other                        gastrointestinal appliance and device CPT copyright 2016 American Medical Association. All rights reserved. The codes documented in this report are preliminary and upon coder review may  be revised to meet current compliance requirements. Lucilla Lame MD, MD 04/22/2017 11:26:43 AM This report has been signed electronically. Number of Addenda: 0 Note Initiated On: 04/22/2017 10:54 AM      [facility]

## 2017-04-22 NOTE — Anesthesia Post-op Follow-up Note (Signed)
Anesthesia QCDR form completed.        

## 2017-04-22 NOTE — Transfer of Care (Signed)
Immediate Anesthesia Transfer of Care Note  Patient: Jason House  Procedure(s) Performed: ENDOSCOPIC RETROGRADE CHOLANGIOPANCREATOGRAPHY (ERCP) (N/A )  Patient Location: PACU and Endoscopy Unit  Anesthesia Type:General  Level of Consciousness: drowsy  Airway & Oxygen Therapy: Patient Spontanous Breathing and Patient connected to nasal cannula oxygen  Post-op Assessment: Report given to RN and Post -op Vital signs reviewed and stable  Post vital signs: Reviewed and stable  Last Vitals:  Vitals:   04/22/17 1007 04/22/17 1130  BP: (!) 120/96   Pulse: 65   Resp: 16   Temp: (!) 36.3 C 36.9 C  SpO2: 100%     Last Pain:  Vitals:   04/22/17 1130  TempSrc: Tympanic  PainSc: Asleep         Complications: No apparent anesthesia complications

## 2017-04-22 NOTE — H&P (Signed)
Lucilla Lame, MD [address] [place], [place] [address] Phone:[phone] Fax : [phone]  Primary Care Physician:  Idelle Crouch, MD Primary Gastroenterologist:  Dr. Allen Norris  Pre-Procedure History & Physical: HPI:  Jason House is a 77 y.o. male is here for an ERCP.   Past Medical History:  Diagnosis Date  . Anemia   . Coronary artery disease   . Glaucoma   . H/O ETOH abuse   . History of shingles   . Hyperlipidemia   . Hypertension     Past Surgical History:  Procedure Laterality Date  . COLONOSCOPY WITH PROPOFOL N/A 09/23/2014   Procedure: COLONOSCOPY WITH PROPOFOL;  Surgeon: Josefine Class, MD;  Location: [facility] ENDOSCOPY;  Service: Endoscopy;  Laterality: N/A;  . CORONARY ANGIOPLASTY    . CORONARY ARTERY BYPASS GRAFT    . EYE SURGERY      Prior to Admission medications   Medication Sig Start Date End Date Taking? Authorizing Provider  amLODipine (NORVASC) 5 MG tablet Take 1 tablet by mouth daily. 02/01/17   [provider]  aspirin EC 81 MG tablet Take 81 mg by mouth daily.    [provider]  atorvastatin (LIPITOR) 80 MG tablet Take 80 mg by mouth daily.    [provider]  ciprofloxacin (CIPRO) 500 MG tablet Take 1 tablet (500 mg total) by mouth 2 (two) times daily. 04/01/17   Demetrios Loll, MD  clopidogrel (PLAVIX) 75 MG tablet Take 75 mg by mouth daily.    [provider]  cyclobenzaprine (FLEXERIL) 10 MG tablet TAKE 1 TABLET BY MOUTH NIGHTLY 10/09/16   [provider]  dorzolamide-timolol (COSOPT) 22.3-6.8 MG/ML ophthalmic solution Place 1 drop into both eyes 2 (two) times daily.    [provider]  latanoprost (XALATAN) 0.005 % ophthalmic solution Place 1 drop into both eyes at bedtime.    [provider]  levothyroxine (SYNTHROID, LEVOTHROID) 50 MCG tablet Take 1 tablet by mouth daily. 01/04/17   [provider]  metoprolol succinate (TOPROL-XL) 25 MG 24 hr tablet  04/09/17    [provider]  metoprolol tartrate (LOPRESSOR) 25 MG tablet 12.5 mg 2 (two) times daily.  11/29/16   [provider]  metroNIDAZOLE (FLAGYL) 500 MG tablet Take 1 tablet (500 mg total) by mouth every 8 (eight) hours. 04/01/17   Demetrios Loll, MD  nitroGLYCERIN (NITROSTAT) 0.4 MG SL tablet Place 0.4 mg under the tongue every 5 (five) minutes as needed.     [provider]  vitamin B-12 (CYANOCOBALAMIN) 1000 MCG tablet Take 1,000 mcg by mouth daily.    [provider]    Allergies as of 04/03/2017  . (No Known Allergies)    Family History  Problem Relation Age of Onset  . Diabetes Mother   . Cancer Sister   . Throat cancer Brother   . Cirrhosis Brother   . COPD Sister   . COPD Sister   . Diabetes Sister     Social History   Socioeconomic History  . Marital status: Married    Spouse name: Not on file  . Number of children: Not on file  . Years of education: Not on file  . Highest education level: Not on file  Social Needs  . Financial resource strain: Not on file  . Food insecurity - worry: Not on file  . Food insecurity - inability: Not on file  . Transportation needs - medical: Not on file  . Transportation needs -  non-medical: Not on file  Occupational History  . Not on file  Tobacco Use  . Smoking status: Former Research scientist (life sciences)  . Smokeless tobacco: Never Used  Substance and Sexual Activity  . Alcohol use: No    Frequency: Never  . Drug use: No  . Sexual activity: Not on file  Other Topics Concern  . Not on file  Social History Narrative  . Not on file    Review of Systems: See HPI, otherwise negative ROS  Physical Exam: BP (!) 120/96   Pulse 65   Temp (!) 97.3 F (36.3 C) (Tympanic)   Resp 16   Ht 5\' 4"  (1.626 m)   Wt 151 lb (68.5 kg)   SpO2 100%   BMI 25.92 kg/m  General:   Alert,  pleasant and cooperative in NAD Head:  Normocephalic and atraumatic. Neck:  Supple; no masses or thyromegaly. Lungs:  Clear throughout to  auscultation.    Heart:  Regular rate and rhythm. Abdomen:  Soft, nontender and nondistended. Normal bowel sounds, without guarding, and without rebound.   Neurologic:  Alert and  oriented x4;  grossly normal neurologically.  Impression/Plan: Jason House is here for an ERCP to be performed for CBD stone  Risks, benefits, limitations, and alternatives regarding  ERCP have been reviewed with the patient.  Questions have been answered.  All parties agreeable.   Lucilla Lame, MD  04/22/2017, 10:54 AM

## 2017-04-22 NOTE — Anesthesia Preprocedure Evaluation (Signed)
Anesthesia Evaluation  Patient identified by MRN, date of birth, ID band Patient awake    Reviewed: Allergy & Precautions, H&P , NPO status , Patient's Chart, lab work & pertinent test results, reviewed documented beta blocker date and time   Airway Mallampati: II   Neck ROM: full    Dental  (+) Poor Dentition   Pulmonary neg pulmonary ROS, former smoker,    Pulmonary exam normal        Cardiovascular Exercise Tolerance: Poor hypertension, + CAD, + Peripheral Vascular Disease and + DOE  negative cardio ROS Normal cardiovascular exam Rhythm:regular Rate:Normal     Neuro/Psych negative neurological ROS  negative psych ROS   GI/Hepatic negative GI ROS, Neg liver ROS,   Endo/Other  negative endocrine ROS  Renal/GU Renal diseasenegative Renal ROS  negative genitourinary   Musculoskeletal   Abdominal   Peds  Hematology negative hematology ROS (+) anemia ,   Anesthesia Other Findings Past Medical History: No date: Anemia No date: Coronary artery disease No date: Glaucoma No date: H/O ETOH abuse No date: History of shingles No date: Hyperlipidemia No date: Hypertension Past Surgical History: 09/23/2014: COLONOSCOPY WITH PROPOFOL; N/A     Comment:  Procedure: COLONOSCOPY WITH PROPOFOL;  Surgeon: Josefine Class, MD;  Location: [facility] ENDOSCOPY;  Service:               Endoscopy;  Laterality: N/A; No date: CORONARY ANGIOPLASTY No date: CORONARY ARTERY BYPASS GRAFT No date: EYE SURGERY BMI    Body Mass Index:  25.92 kg/m     Reproductive/Obstetrics negative OB ROS                             Anesthesia Physical Anesthesia Plan  ASA: IV  Anesthesia Plan: General   Post-op Pain Management:    Induction:   PONV Risk Score and Plan:   Airway Management Planned:   Additional Equipment:   Intra-op Plan:   Post-operative Plan:   Informed Consent: I have  reviewed the patients History and Physical, chart, labs and discussed the procedure including the risks, benefits and alternatives for the proposed anesthesia with the patient or authorized representative who has indicated his/her understanding and acceptance.   Dental Advisory Given  Plan Discussed with: CRNA  Anesthesia Plan Comments:         Anesthesia Quick Evaluation

## 2017-04-23 NOTE — Telephone Encounter (Signed)
Spoke with pt's wife, Parke Simmers and advised her pt can resume normal daily medications.

## 2017-04-23 NOTE — Anesthesia Postprocedure Evaluation (Signed)
Anesthesia Post Note  Patient: Jason House  Procedure(s) Performed: ENDOSCOPIC RETROGRADE CHOLANGIOPANCREATOGRAPHY (ERCP) (N/A )  Patient location during evaluation: PACU Anesthesia Type: General Level of consciousness: awake and alert Pain management: pain level controlled Vital Signs Assessment: post-procedure vital signs reviewed and stable Respiratory status: spontaneous breathing, nonlabored ventilation, respiratory function stable and patient connected to nasal cannula oxygen Cardiovascular status: blood pressure returned to baseline and stable Postop Assessment: no apparent nausea or vomiting Anesthetic complications: no     Last Vitals:  Vitals:   04/22/17 1150 04/22/17 1200  BP: 127/64 (!) 122/94  Pulse:    Resp: 16   Temp:    SpO2:      Last Pain:  Vitals:   04/23/17 0825  TempSrc:   PainSc: 0-No pain                 Molli Barrows

## 2017-05-07 ENCOUNTER — Telehealth: Payer: Self-pay

## 2017-05-07 ENCOUNTER — Ambulatory Visit (INDEPENDENT_AMBULATORY_CARE_PROVIDER_SITE_OTHER): Payer: PPO | Admitting: Surgery

## 2017-05-07 ENCOUNTER — Encounter: Payer: Self-pay | Admitting: Surgery

## 2017-05-07 VITALS — BP 115/66 | HR 71 | Temp 98.1°F | Ht 66.0 in | Wt 150.0 lb

## 2017-05-07 DIAGNOSIS — K851 Biliary acute pancreatitis without necrosis or infection: Secondary | ICD-10-CM

## 2017-05-07 NOTE — Progress Notes (Signed)
05/07/2017  History of Present Illness: Jason House is a 77 y.o. male who presents with recent history of gallstone pancreatitis.  He had a repeat ERCP with Dr. Allen Norris last month during which he had a sphincterotomy and the CBD was swept to clear out sludge.  He has been doing well without issues.  Denies any abdominal pain, nausea, vomiting, and is tolerating a regular diet.  He presents now to discuss surgery for his gallbladder.  Past Medical History: Past Medical History:  Diagnosis Date  . Anemia   . Coronary artery disease   . Glaucoma   . H/O ETOH abuse   . History of shingles   . Hyperlipidemia   . Hypertension      Past Surgical History: Past Surgical History:  Procedure Laterality Date  . COLONOSCOPY WITH PROPOFOL N/A 09/23/2014   Procedure: COLONOSCOPY WITH PROPOFOL;  Surgeon: Josefine Class, MD;  Location: [facility] ENDOSCOPY;  Service: Endoscopy;  Laterality: N/A;  . CORONARY ANGIOPLASTY    . CORONARY ARTERY BYPASS GRAFT    . ERCP N/A 04/22/2017   Procedure: ENDOSCOPIC RETROGRADE CHOLANGIOPANCREATOGRAPHY (ERCP);  Surgeon: Lucilla Lame, MD;  Location: [facility] ENDOSCOPY;  Service: Endoscopy;  Laterality: N/A;  . EYE SURGERY      Home Medications: Prior to Admission medications   Medication Sig Start Date End Date Taking? Authorizing Provider  amLODipine (NORVASC) 5 MG tablet Take 1 tablet by mouth daily. 02/01/17  Yes [provider]  aspirin EC 81 MG tablet Take 81 mg by mouth daily.   Yes [provider]  atorvastatin (LIPITOR) 80 MG tablet Take 80 mg by mouth daily.   Yes [provider]  clopidogrel (PLAVIX) 75 MG tablet Take 75 mg by mouth daily.   Yes [provider]  cyclobenzaprine (FLEXERIL) 10 MG tablet TAKE 1 TABLET BY MOUTH NIGHTLY 10/09/16  Yes [provider]  dorzolamide-timolol (COSOPT) 22.3-6.8 MG/ML ophthalmic solution Place 1 drop into both eyes 2 (two) times daily.   Yes [provider]  latanoprost  (XALATAN) 0.005 % ophthalmic solution Place 1 drop into both eyes at bedtime.   Yes [provider]  levothyroxine (SYNTHROID, LEVOTHROID) 50 MCG tablet Take 1 tablet by mouth daily. 01/04/17  Yes [provider]  metoprolol succinate (TOPROL-XL) 25 MG 24 hr tablet  04/09/17  Yes [provider]  vitamin B-12 (CYANOCOBALAMIN) 1000 MCG tablet Take 1,000 mcg by mouth daily.   Yes [provider]    Allergies: No Known Allergies  Review of Systems: Review of Systems  Constitutional: Negative for chills and fever.  Respiratory: Negative for shortness of breath.   Cardiovascular: Negative for chest pain.  Gastrointestinal: Negative for abdominal pain, nausea and vomiting.    Physical Exam BP 115/66   Pulse 71   Temp 98.1 F (36.7 C) (Oral)   Ht 5\' 6"  (1.676 m)   Wt 68 kg (150 lb)   BMI 24.21 kg/m  CONSTITUTIONAL: No acute distress RESPIRATORY:  Lungs are clear, and breath sounds are equal bilaterally. Normal respiratory effort without pathologic use of accessory muscles. CARDIOVASCULAR: Heart is regular without murmurs, gallops, or rubs. GI: The abdomen is soft, nondistended, nontender to palpation.  MUSCULOSKELETAL:  Normal muscle strength and tone in all four extremities.  No peripheral edema or cyanosis. SKIN: Skin turgor is normal. There are no pathologic skin lesions.  NEUROLOGIC:  Motor and sensation is grossly normal.  Cranial nerves are grossly intact. PSYCH:  Alert and oriented to person, place and  time. Affect is normal.  Labs/Imaging: No labs recently. ERCP 2/19: - One stent from the biliary tree was seen in the major papilla. - A biliary sphincterotomy was performed. - One stent was removed from the biliary tree. - The biliary tree was swept and sludge was found.   Assessment and Plan: This is a 77 y.o. male who presents s/p ERCP after recent hospitalization for gallstone pancreatitis.  Discussed with the patient that given his  severe episode of gallstone pancreatitis, the recommendation would be to proceed with laparoscopic cholecystectomy to remove the source of this previous episode and prevent any further episodes in the future.  He agrees.  Discussed with the patient the risk of bleeding, infection, and injury to surrounding structures.  Discussed with the patient that we would also do an intraoperative cholangiogram to make sure the CBD is clean.  He agrees with the plan and all of his questions have been answered.  We will plan for surgery on 3/19.  Will get cardiology clearance.  He knows to stop his Plavix 5 days prior to surgery.  Face-to-face time spent with the patient and care providers was 40 minutes, with more than 50% of the time spent counseling, educating, and coordinating care of the patient.     Melvyn Neth, Jo[place]

## 2017-05-07 NOTE — Telephone Encounter (Signed)
I faxed a cardiac and anti-coagulant clearance was faxed to Dr. Humphrey Rolls. Awaiting for response.

## 2017-05-07 NOTE — Patient Instructions (Signed)
You have requested to have your gallbladder removed. This will be done on 3/19 at [facility] with Dr. Hampton Abbot.  You will most likely be out of work 1-2 weeks for this surgery. You will return after your post-op appointment with a lifting restriction for approximately 4 more weeks.  You will be able to eat anything you would like to following surgery. But, start by eating a bland diet and advance this as tolerated. The Gallbladder diet is below, please go as closely by this diet as possible prior to surgery to avoid any further attacks.  Please see the (blue)pre-care form that you have been given today. If you have any questions, please call our office.  Laparoscopic Cholecystectomy Laparoscopic cholecystectomy is surgery to remove the gallbladder. The gallbladder is located in the upper right part of the abdomen, behind the liver. It is a storage sac for bile, which is produced in the liver. Bile aids in the digestion and absorption of fats. Cholecystectomy is often done for inflammation of the gallbladder (cholecystitis). This condition is usually caused by a buildup of gallstones (cholelithiasis) in the gallbladder. Gallstones can block the flow of bile, and that can result in inflammation and pain. In severe cases, emergency surgery may be required. If emergency surgery is not required, you will have time to prepare for the procedure. Laparoscopic surgery is an alternative to open surgery. Laparoscopic surgery has a shorter recovery time. Your common bile duct may also need to be examined during the procedure. If stones are found in the common bile duct, they may be removed. LET [facility] CARE PROVIDER KNOW ABOUT:  Any allergies you have.  All medicines you are taking, including vitamins, herbs, eye drops, creams, and over-the-counter medicines.  Previous problems you or members of your family have had with the use of anesthetics.  Any blood disorders you have.  Previous surgeries  you have had.    Any medical conditions you have. RISKS AND COMPLICATIONS Generally, this is a safe procedure. However, problems may occur, including:  Infection.  Bleeding.  Allergic reactions to medicines.  Damage to other structures or organs.  A stone remaining in the common bile duct.  A bile leak from the cyst duct that is clipped when your gallbladder is removed.  The need to convert to open surgery, which requires a larger incision in the abdomen. This may be necessary if your surgeon thinks that it is not safe to continue with a laparoscopic procedure. BEFORE THE PROCEDURE  Ask your health care provider about:  Changing or stopping your regular medicines. This is especially important if you are taking diabetes medicines or blood thinners.  Taking medicines such as aspirin and ibuprofen. These medicines can thin your blood. Do not take these medicines before your procedure if your health care provider instructs you not to.  Follow instructions from your health care provider about eating or drinking restrictions.  Let your health care provider know if you develop a cold or an infection before surgery.  Plan to have someone take you home after the procedure.  Ask your health care provider how your surgical site will be marked or identified.  You may be given antibiotic medicine to help prevent infection. PROCEDURE  To reduce your risk of infection:  Your health care team will wash or sanitize their hands.  Your skin will be washed with soap.  An IV tube may be inserted into one of your veins.  You will be given a medicine to make  you fall asleep (general anesthetic).  A breathing tube will be placed in your mouth.  The surgeon will make several small cuts (incisions) in your abdomen.  A thin, lighted tube (laparoscope) that has a tiny camera on the end will be inserted through one of the small incisions. The camera on the laparoscope will send a picture to  a TV screen (monitor) in the operating room. This will give the surgeon a good view inside your abdomen.  A gas will be pumped into your abdomen. This will expand your abdomen to give the surgeon more room to perform the surgery.  Other tools that are needed for the procedure will be inserted through the other incisions. The gallbladder will be removed through one of the incisions.  After your gallbladder has been removed, the incisions will be closed with stitches (sutures), staples, or skin glue.  Your incisions may be covered with a bandage (dressing). The procedure may vary among health care providers and hospitals. AFTER THE PROCEDURE  Your blood pressure, heart rate, breathing rate, and blood oxygen level will be monitored often until the medicines you were given have worn off.  You will be given medicines as needed to control your pain.   This information is not intended to replace advice given to you by your health care provider. Make sure you discuss any questions you have with your health care provider.   Document Released: 02/18/2005 Document Revised: 11/09/2014 Document Reviewed: 09/30/2012 Elsevier Interactive Patient Education 2016 [place] Diet for Gallbladder Conditions A low-fat diet can be helpful if you have pancreatitis or a gallbladder condition. With these conditions, your pancreas and gallbladder have trouble digesting fats. A healthy eating plan with less fat will help rest your pancreas and gallbladder and reduce your symptoms. WHAT DO I NEED TO KNOW ABOUT THIS DIET?  Eat a low-fat diet.  Reduce your fat intake to less than 20-30% of your total daily calories. This is less than 50-60 g of fat per day.  Remember that you need some fat in your diet. Ask your dietician what your daily goal should be.  Choose nonfat and low-fat healthy foods. Look for the words "nonfat," "low fat," or "fat free."  As a guide, look on the label and choose foods  with less than 3 g of fat per serving. Eat only one serving.  Avoid alcohol.  Do not smoke. If you need help quitting, talk with your health care provider.  Eat small frequent meals instead of three large heavy meals. WHAT FOODS CAN I EAT? Grains Include healthy grains and starches such as potatoes, wheat bread, fiber-rich cereal, and brown rice. Choose whole grain options whenever possible. In adults, whole grains should account for 45-65% of your daily calories.  Fruits and Vegetables Eat plenty of fruits and vegetables. Fresh fruits and vegetables add fiber to your diet. Meats and Other Protein Sources Eat lean meat such as chicken and pork. Trim any fat off of meat before cooking it. Eggs, fish, and beans are other sources of protein. In adults, these foods should account for 10-35% of your daily calories. Dairy Choose low-fat milk and dairy options. Dairy includes fat and protein, as well as calcium.  Fats and Oils Limit high-fat foods such as fried foods, sweets, baked goods, sugary drinks.  Other Creamy sauces and condiments, such as mayonnaise, can add extra fat. Think about whether or not you need to use them, or use smaller amounts or low fat options.  WHAT FOODS ARE NOT RECOMMENDED?  High fat foods, such as:  [employer].  Ice cream.  [place] toast.  Sweet rolls.  Pizza.  Cheese bread.  Foods covered with batter, butter, creamy sauces, or cheese.  Fried foods.  Sugary drinks and desserts.  Foods that cause gas or bloating   This information is not intended to replace advice given to you by your health care provider. Make sure you discuss any questions you have with your health care provider.   Document Released: 02/23/2013 Document Reviewed: 02/23/2013 Elsevier Interactive Patient Education [employer].

## 2017-05-07 NOTE — Addendum Note (Signed)
Addended by: Riki Sheer on: 05/07/2017 04:36 PM   Modules accepted: Orders, SmartSet

## 2017-05-08 NOTE — Telephone Encounter (Signed)
Cardiac and anti-coagulant clearances were obtained by patient's cardiologist. Please into his chart into media. I will fax a copy to pre-admit since his pre-admit appt is on 05/12/2017.

## 2017-05-09 DIAGNOSIS — R0602 Shortness of breath: Secondary | ICD-10-CM | POA: Diagnosis not present

## 2017-05-09 DIAGNOSIS — E782 Mixed hyperlipidemia: Secondary | ICD-10-CM | POA: Diagnosis not present

## 2017-05-09 DIAGNOSIS — I7389 Other specified peripheral vascular diseases: Secondary | ICD-10-CM | POA: Diagnosis not present

## 2017-05-09 DIAGNOSIS — I340 Nonrheumatic mitral (valve) insufficiency: Secondary | ICD-10-CM | POA: Diagnosis not present

## 2017-05-09 DIAGNOSIS — I25810 Atherosclerosis of coronary artery bypass graft(s) without angina pectoris: Secondary | ICD-10-CM | POA: Diagnosis not present

## 2017-05-09 DIAGNOSIS — I2510 Atherosclerotic heart disease of native coronary artery without angina pectoris: Secondary | ICD-10-CM | POA: Diagnosis not present

## 2017-05-09 DIAGNOSIS — I10 Essential (primary) hypertension: Secondary | ICD-10-CM | POA: Diagnosis not present

## 2017-05-12 ENCOUNTER — Inpatient Hospital Stay: Admission: RE | Admit: 2017-05-12 | Payer: PPO | Source: Ambulatory Visit

## 2017-05-12 DIAGNOSIS — J40 Bronchitis, not specified as acute or chronic: Secondary | ICD-10-CM | POA: Diagnosis not present

## 2017-05-12 DIAGNOSIS — I499 Cardiac arrhythmia, unspecified: Secondary | ICD-10-CM | POA: Diagnosis not present

## 2017-05-14 ENCOUNTER — Telehealth: Payer: Self-pay | Admitting: Surgery

## 2017-05-14 ENCOUNTER — Telehealth: Payer: Self-pay

## 2017-05-14 NOTE — Telephone Encounter (Signed)
Cardiac and anti-coagulant clearances were obtained and approved at this time. Cardiac and anti-coagulant clearances can be found under Media Tab in patient's chart.

## 2017-05-14 NOTE — Telephone Encounter (Signed)
Patients wife is calling, patients husband has been sick,coughing and having shortness of breath. Patients wife is wanting to r.s his surgery after he gets over being sick. Please call and advise.

## 2017-05-15 ENCOUNTER — Telehealth: Payer: Self-pay

## 2017-05-15 ENCOUNTER — Emergency Department: Payer: PPO

## 2017-05-15 ENCOUNTER — Emergency Department
Admission: EM | Admit: 2017-05-15 | Discharge: 2017-05-15 | Disposition: A | Discharge status: Home / Self Care | Payer: PPO | Attending: Emergency Medicine | Admitting: Emergency Medicine

## 2017-05-15 ENCOUNTER — Encounter: Payer: Self-pay | Admitting: Emergency Medicine

## 2017-05-15 DIAGNOSIS — N189 Chronic kidney disease, unspecified: Secondary | ICD-10-CM | POA: Insufficient documentation

## 2017-05-15 DIAGNOSIS — I129 Hypertensive chronic kidney disease with stage 1 through stage 4 chronic kidney disease, or unspecified chronic kidney disease: Secondary | ICD-10-CM | POA: Insufficient documentation

## 2017-05-15 DIAGNOSIS — E785 Hyperlipidemia, unspecified: Secondary | ICD-10-CM | POA: Insufficient documentation

## 2017-05-15 DIAGNOSIS — Z7902 Long term (current) use of antithrombotics/antiplatelets: Secondary | ICD-10-CM | POA: Insufficient documentation

## 2017-05-15 DIAGNOSIS — J441 Chronic obstructive pulmonary disease with (acute) exacerbation: Secondary | ICD-10-CM | POA: Insufficient documentation

## 2017-05-15 DIAGNOSIS — R05 Cough: Secondary | ICD-10-CM | POA: Diagnosis not present

## 2017-05-15 DIAGNOSIS — Z87891 Personal history of nicotine dependence: Secondary | ICD-10-CM | POA: Diagnosis not present

## 2017-05-15 DIAGNOSIS — R0602 Shortness of breath: Secondary | ICD-10-CM | POA: Diagnosis not present

## 2017-05-15 DIAGNOSIS — Z7982 Long term (current) use of aspirin: Secondary | ICD-10-CM | POA: Diagnosis not present

## 2017-05-15 DIAGNOSIS — Z79899 Other long term (current) drug therapy: Secondary | ICD-10-CM | POA: Diagnosis not present

## 2017-05-15 DIAGNOSIS — R079 Chest pain, unspecified: Secondary | ICD-10-CM | POA: Diagnosis not present

## 2017-05-15 DIAGNOSIS — I2510 Atherosclerotic heart disease of native coronary artery without angina pectoris: Secondary | ICD-10-CM | POA: Diagnosis not present

## 2017-05-15 LAB — COMPREHENSIVE METABOLIC PANEL
ALBUMIN: 4.2 g/dL (ref 3.5–5.0)
ALK PHOS: 48 U/L (ref 38–126)
ALT: 20 U/L (ref 17–63)
AST: 33 U/L (ref 15–41)
Anion gap: 9 (ref 5–15)
BILIRUBIN TOTAL: 1.1 mg/dL (ref 0.3–1.2)
BUN: 24 mg/dL — AB (ref 6–20)
CALCIUM: 9.3 mg/dL (ref 8.9–10.3)
CO2: 24 mmol/L (ref 22–32)
Chloride: 100 mmol/L — ABNORMAL LOW (ref 101–111)
Creatinine, Ser: 1.32 mg/dL — ABNORMAL HIGH (ref 0.61–1.24)
GFR calc Af Amer: 58 mL/min — ABNORMAL LOW (ref >60)
GFR calc non Af Amer: 50 mL/min — ABNORMAL LOW (ref >60)
GLUCOSE: 115 mg/dL — AB (ref 65–99)
Potassium: 4.3 mmol/L (ref 3.5–5.1)
Sodium: 133 mmol/L — ABNORMAL LOW (ref 135–145)
TOTAL PROTEIN: 8.1 g/dL (ref 6.5–8.1)

## 2017-05-15 LAB — TROPONIN I: Troponin I: <0.03 ng/mL (ref <0.03)

## 2017-05-15 LAB — CBC WITH DIFFERENTIAL/PLATELET
BASOS ABS: 0.1 10*3/uL (ref 0–0.1)
BASOS PCT: 1 %
Eosinophils Absolute: 0.3 10*3/uL (ref 0–0.7)
Eosinophils Relative: 3 %
HEMATOCRIT: 38.5 % — AB (ref 40.0–52.0)
HEMOGLOBIN: 13.1 g/dL (ref 13.0–18.0)
LYMPHS PCT: 23 %
Lymphs Abs: 1.7 10*3/uL (ref 1.0–3.6)
MCH: 30.7 pg (ref 26.0–34.0)
MCHC: 34 g/dL (ref 32.0–36.0)
MCV: 90.2 fL (ref 80.0–100.0)
MONO ABS: 0.9 10*3/uL (ref 0.2–1.0)
Monocytes Relative: 11 %
NEUTROS ABS: 4.7 10*3/uL (ref 1.4–6.5)
NEUTROS PCT: 62 %
Platelets: 284 10*3/uL (ref 150–440)
RBC: 4.26 MIL/uL — AB (ref 4.40–5.90)
RDW: 13.9 % (ref 11.5–14.5)
WBC: 7.6 10*3/uL (ref 3.8–10.6)

## 2017-05-15 LAB — BRAIN NATRIURETIC PEPTIDE: B Natriuretic Peptide: 64 pg/mL (ref 0.0–100.0)

## 2017-05-15 MED ORDER — METHYLPREDNISOLONE SODIUM SUCC 125 MG IJ SOLR
125.0000 mg | Freq: Once | INTRAMUSCULAR | Status: AC
  Administered 2017-05-15: 125 mg via INTRAVENOUS
  Filled 2017-05-15: qty 2

## 2017-05-15 MED ORDER — BACITRACIN ZINC 500 UNIT/GM EX OINT
TOPICAL_OINTMENT | CUTANEOUS | Status: AC
  Filled 2017-05-15: qty 0.9

## 2017-05-15 MED ORDER — BACITRACIN ZINC 500 UNIT/GM EX OINT
TOPICAL_OINTMENT | Freq: Once | CUTANEOUS | Status: AC
  Administered 2017-05-15: 16:00:00 via TOPICAL

## 2017-05-15 MED ORDER — IPRATROPIUM-ALBUTEROL 0.5-2.5 (3) MG/3ML IN SOLN
3.0000 mL | Freq: Once | RESPIRATORY_TRACT | Status: AC
  Administered 2017-05-15: 3 mL via RESPIRATORY_TRACT
  Filled 2017-05-15: qty 3

## 2017-05-15 MED ORDER — AZITHROMYCIN 250 MG PO TABS: ORAL_TABLET | ORAL | 0 refills | Status: DC

## 2017-05-15 MED ORDER — SODIUM CHLORIDE 0.9 % IV BOLUS (SEPSIS)
500.0000 mL | Freq: Once | INTRAVENOUS | Status: AC
  Administered 2017-05-15: 500 mL via INTRAVENOUS

## 2017-05-15 MED ORDER — MAGNESIUM SULFATE 2 GM/50ML IV SOLN
2.0000 g | Freq: Once | INTRAVENOUS | Status: AC
  Administered 2017-05-15: 2 g via INTRAVENOUS
  Filled 2017-05-15: qty 50

## 2017-05-15 MED ORDER — SPACER/AERO CHAMBER MOUTHPIECE MISC: 1.0000 [IU] | 0 refills | Status: DC | PRN

## 2017-05-15 MED ORDER — ALBUTEROL SULFATE 108 (90 BASE) MCG/ACT IN AEPB: 1.0000 | INHALATION_SPRAY | RESPIRATORY_TRACT | 0 refills | Status: AC | PRN

## 2017-05-15 MED ORDER — PREDNISONE 50 MG PO TABS: 50.0000 mg | ORAL_TABLET | Freq: Every day | ORAL | 0 refills | Status: AC

## 2017-05-15 NOTE — ED Provider Notes (Signed)
[facility] Emergency Department Provider Note  ____________________________________________   First MD Initiated Contact with Patient 05/15/17 1431     (approximate)  I have reviewed the triage vital signs and the nursing notes.   HISTORY  Chief Complaint Shortness of Breath    HPI Jason House is a 77 y.o. male who self presents to the emergency department with roughly 1 week of shortness of breath.  His symptoms initially began about a week ago with cough productive of small amounts of green sputum.  4 days ago he was seen by a 14 assistant at his primary care physician's office who diagnosed him with "bacterial bronchitis" and prescribed him amoxicillin.  The patient reports no improvement with the symptoms.  He has a past medical history of coronary artery disease as well as COPD.  He is not oxygen dependent at home however when he came back to clinic this morning he was noted to be hypoxic to the 80s and advised to come to the emergency department.  His symptoms have been gradual onset and slowly progressive.  They are now moderate severity.  They are worse with exertion and improved with rest.  He has sharp mild severity upper chest pain worse with cough improved with rest.  Past Medical History:  Diagnosis Date  . Anemia   . Coronary artery disease   . Glaucoma   . H/O ETOH abuse   . History of shingles   . Hyperlipidemia   . Hypertension     Patient Active Problem List   Diagnosis Date Noted  . Calculus of bile duct without cholecystitis and without obstruction   . Encounter for fitting and adjustment of other gastrointestinal appliance and device   . Gram-negative bacteremia 04/18/2017  . Atherosclerotic heart disease of native coronary artery without angina pectoris 04/08/2017  . Chronic kidney disease 04/08/2017  . Glaucoma 04/08/2017  . Choledocholithiasis 03/27/2017  . Jaundice   . Acute pancreatitis   . Bilateral carotid  artery stenosis 01/28/2017  . Hyperlipidemia 01/28/2017  . Essential hypertension 01/28/2017  . Anemia, unspecified 04/22/2012    Past Surgical History:  Procedure Laterality Date  . COLONOSCOPY WITH PROPOFOL N/A 09/23/2014   Procedure: COLONOSCOPY WITH PROPOFOL;  Surgeon: Josefine Class, MD;  Location: [facility] ENDOSCOPY;  Service: Endoscopy;  Laterality: N/A;  . CORONARY ANGIOPLASTY    . CORONARY ARTERY BYPASS GRAFT    . ERCP N/A 04/22/2017   Procedure: ENDOSCOPIC RETROGRADE CHOLANGIOPANCREATOGRAPHY (ERCP);  Surgeon: Lucilla Lame, MD;  Location: [facility] ENDOSCOPY;  Service: Endoscopy;  Laterality: N/A;  . EYE SURGERY      Prior to Admission medications   Medication Sig Start Date End Date Taking? Authorizing Provider  amLODipine (NORVASC) 5 MG tablet Take 1 tablet by mouth daily. 02/01/17  Yes [provider]  aspirin EC 81 MG tablet Take 81 mg by mouth daily.   Yes [provider]  atorvastatin (LIPITOR) 80 MG tablet Take 80 mg by mouth daily.   Yes [provider]  clopidogrel (PLAVIX) 75 MG tablet Take 75 mg by mouth daily.   Yes [provider]  cyclobenzaprine (FLEXERIL) 10 MG tablet Take 10 mg by mouth 3 (three) times daily as needed for muscle spasms.   Yes [provider]  dorzolamide-timolol (COSOPT) 22.3-6.8 MG/ML ophthalmic solution Place 1 drop into both eyes 2 (two) times daily.   Yes [provider]  hydrochlorothiazide (MICROZIDE) 12.5 MG capsule Take 12.5 mg by mouth every evening.  Yes [provider]  latanoprost (XALATAN) 0.005 % ophthalmic solution Place 1 drop into both eyes at bedtime.   Yes [provider]  levothyroxine (SYNTHROID, LEVOTHROID) 50 MCG tablet Take 1 tablet by mouth daily before breakfast.  01/04/17  Yes [provider]  lisinopril (PRINIVIL,ZESTRIL) 40 MG tablet Take 40 mg by mouth daily.   Yes [provider]  metoprolol succinate (TOPROL-XL) 25 MG 24 hr tablet  Take 25 mg by mouth daily.  04/09/17  Yes [provider]  vitamin B-12 (CYANOCOBALAMIN) 1000 MCG tablet Take 1,000 mcg by mouth daily.   Yes [provider]  Albuterol Sulfate (PROAIR RESPICLICK) 297 (90 Base) MCG/ACT AEPB Inhale 1 puff into the lungs every 4 (four) hours as needed (shortness of breath). 05/15/17   Darel Hong, MD  azithromycin (ZITHROMAX Z-PAK) 250 MG tablet Take 2 tablets (500 mg) on  Day 1,  followed by 1 tablet (250 mg) once daily on Days 2 through 5. 05/15/17   Darel Hong, MD  predniSONE (DELTASONE) 50 MG tablet Take 1 tablet (50 mg total) by mouth daily for 4 days. 05/15/17 05/19/17  Darel Hong, MD  Spacer/Aero Chamber Mouthpiece MISC 1 Units by Does not apply route every 4 (four) hours as needed (wheezing). 05/15/17   Darel Hong, MD    Allergies Patient has no known allergies.  Family History  Problem Relation Age of Onset  . Diabetes Mother   . Cancer Sister   . Throat cancer Brother   . Cirrhosis Brother   . COPD Sister   . COPD Sister   . Diabetes Sister     Social History Social History   Tobacco Use  . Smoking status: Former Research scientist (life sciences)  . Smokeless tobacco: Never Used  Substance Use Topics  . Alcohol use: No    Frequency: Never  . Drug use: No    Review of Systems Constitutional: No fever/chills Eyes: No visual changes. ENT: No sore throat. Cardiovascular: Positive for chest pain. Respiratory: Positive for shortness of breath. Gastrointestinal: No abdominal pain.  No nausea, no vomiting.  No diarrhea.  No constipation. Genitourinary: Negative for dysuria. Musculoskeletal: Negative for back pain. Skin: Negative for rash. Neurological: Negative for headaches, focal weakness or numbness.   ____________________________________________   PHYSICAL EXAM:  VITAL SIGNS: ED Triage Vitals  Enc Vitals Group     BP 05/15/17 1417 (!) 132/55     Pulse Rate 05/15/17 1417 89     Resp 05/15/17 1417 (!) 24     Temp  05/15/17 1417 (!) 97.5 F (36.4 C)     Temp Source 05/15/17 1417 Oral     SpO2 05/15/17 1417 96 %     Weight 05/15/17 1421 150 lb (68 kg)     Height 05/15/17 1421 5\' 6"  (1.676 m)     Head Circumference --      Peak Flow --      Pain Score 05/15/17 1421 4     Pain Loc --      Pain Edu? --      Excl. in [place]? --     Constitutional: Alert and oriented x4 appears clearly short of breath using mild accessory muscles Eyes: PERRL EOMI. Head: Atraumatic. Nose: No congestion/rhinnorhea. Mouth/Throat: No trismus Neck: No stridor.  Uncomfortable when lying completely flat and does have moderate JVD Cardiovascular: Normal rate, regular rhythm. Grossly normal heart sounds.  Good peripheral circulation. Respiratory: Increased respiratory effort with relatively quiet lungs and I cannot hear much air movement  Gastrointestinal: Soft nontender Musculoskeletal: No lower extremity edema legs are equal in size Neurologic:  Normal speech and language. No gross focal neurologic deficits are appreciated. Skin:  Skin is warm, dry and intact. No rash noted. Psychiatric: Mood and affect are normal. Speech and behavior are normal.    ____________________________________________   DIFFERENTIAL includes but not limited to  COPD exacerbation, pneumothorax, pneumonia, influenza, CHF exacerbation, pulmonary edema, pulmonary embolism ____________________________________________   LABS (all labs ordered are listed, but only abnormal results are displayed)  Labs Reviewed  COMPREHENSIVE METABOLIC PANEL - Abnormal; Notable for the following components:      Result Value   Sodium 133 (*)    Chloride 100 (*)    Glucose, Bld 115 (*)    BUN 24 (*)    Creatinine, Ser 1.32 (*)    GFR calc non Af Amer 50 (*)    GFR calc Af Amer 58 (*)    All other components within normal limits  CBC WITH DIFFERENTIAL/PLATELET - Abnormal; Notable for the following components:   RBC 4.26 (*)    HCT 38.5 (*)    All other  components within normal limits  BRAIN NATRIURETIC PEPTIDE  TROPONIN I  URINALYSIS, COMPLETE (UACMP) WITH MICROSCOPIC  INFLUENZA PANEL BY PCR (TYPE A & B)    Lab work reviewed by me with no acute disease __________________________________________  EKG  ED ECG REPORT I, Darel Hong, the attending physician, personally viewed and interpreted this ECG.  Date: 05/15/2017 EKG Time:  Rate: 88 Rhythm: normal sinus rhythm QRS Axis: normal Intervals: normal ST/T Wave abnormalities: normal Narrative Interpretation: no evidence of acute ischemia  ____________________________________________  RADIOLOGY  Chest x-ray reviewed by me with no acute disease ____________________________________________   PROCEDURES  Procedure(s) performed: no  Procedures  Critical Care performed: no  Observation: no ____________________________________________   INITIAL IMPRESSION / ASSESSMENT AND PLAN / ED COURSE  Pertinent labs & imaging results that were available during my care of the patient were reviewed by me and considered in my medical decision making (see chart for details).  On arrival the patient is hypoxic on room air although comes up with nasal cannula.  Lungs are relatively quiet and in the setting of COPD and concern for bronchospasm.  Several duo nebs along with steroids and fluids while chest x-ray and labs are pending.     ----------------------------------------- 3:27 PM on 05/15/2017 -----------------------------------------  The patient is now saturating 96% on room air and moving more air and has slight wheeze to his lungs.  2 more breathing treatments and magnesium now. ____________________________________________  ----------------------------------------- 4:12 PM on 05/15/2017 -----------------------------------------  The patient feels significantly improved.  I personally got him up walked him around the department and sat him down and he is saturating 91%  on room air with a satisfactory work of breathing.  Offered the patient inpatient admission versus outpatient management and he would prefer to go home which I think is reasonable.  Discharged home in improved condition and he verbalizes understanding and agree with the plan.  FINAL CLINICAL IMPRESSION(S) / ED DIAGNOSES  Final diagnoses:  COPD exacerbation ([place])      NEW MEDICATIONS STARTED DURING THIS VISIT:  New Prescriptions   ALBUTEROL SULFATE (PROAIR RESPICLICK) 761 (90 BASE) MCG/ACT AEPB    Inhale 1 puff into the lungs every 4 (four) hours as needed (shortness of breath).   AZITHROMYCIN (ZITHROMAX Z-PAK) 250 MG TABLET    Take 2 tablets (500 mg) on  Day 1,  followed by  1 tablet (250 mg) once daily on Days 2 through 5.   PREDNISONE (DELTASONE) 50 MG TABLET    Take 1 tablet (50 mg total) by mouth daily for 4 days.   SPACER/AERO CHAMBER MOUTHPIECE MISC    1 Units by Does not apply route every 4 (four) hours as needed (wheezing).     Note:  This document was prepared using Dragon voice recognition software and may include unintentional dictation errors.     Darel Hong, MD 05/15/17 [phone]

## 2017-05-15 NOTE — Telephone Encounter (Signed)
Patient's wife has called and stated that she would like to cancel the patient's surgery. He has been sick with cough, congestion, shortness of breast at this time. She stated that she will be making him an appointment with his PCP.   Patient was scheduled to have a laparoscopic cholecystectomy on 05/20/17 with Dr Hampton Abbot.   I advised the wife to call our office once patient recovers and that we will more than likely need to see him back in the office to determine another surgery date.   I have advised the OR and pre admission testing of the cancellation.

## 2017-05-15 NOTE — ED Triage Notes (Signed)
Pt arrived from home with complaints of shortness of breath since Monday. Pt was seen at primary doctor on Monday who prescribed pt antibiotics. Pt states today he felt worse and was told to come to walk in clinic. PT was then sent to ED after noticing a room air saturation in the 80s and was put on 2 L,. Pt 96% on 2L in triage pt's breathing labored and lungs sound are not clear. Pt has a productive cough.

## 2017-05-15 NOTE — Telephone Encounter (Signed)
Patient's cardiac clearance was obtained. Please see media.

## 2017-05-15 NOTE — ED Notes (Signed)
FIRST NURSE NOTE: Pt sent from [facility] was recently Dx with bronchitis and placed on Abx with back with worsening sx today, states his O2 on RA dropped to mid 80's on RA with ambulation, states at rest 90-92%, pt arrived to the ED on 2L [place].

## 2017-05-15 NOTE — Discharge Instructions (Signed)
Please stop taking your amoxicillin and take a azithromycin and your steroids instead.  Follow-up with your primary care physician in 2 days for recheck and return to the emergency department sooner for any concerns whatsoever.  It was a pleasure to take care of you today, and thank you for coming to our emergency department.  If you have any questions or concerns before leaving please ask the nurse to grab me and I'm more than happy to go through your aftercare instructions again.  If you were prescribed any opioid pain medication today such as Norco, Vicodin, Percocet, morphine, hydrocodone, or oxycodone please make sure you do not drive when you are taking this medication as it can alter your ability to drive safely.  If you have any concerns once you are home that you are not improving or are in fact getting worse before you can make it to your follow-up appointment, please do not hesitate to call 911 and come back for further evaluation.  Darel Hong, MD  Results for orders placed or performed during the hospital encounter of 05/15/17  Comprehensive metabolic panel  Result Value Ref Range   Sodium 133 (L) 135 - 145 mmol/L   Potassium 4.3 3.5 - 5.1 mmol/L   Chloride 100 (L) 101 - 111 mmol/L   CO2 24 22 - 32 mmol/L   Glucose, Bld 115 (H) 65 - 99 mg/dL   BUN 24 (H) 6 - 20 mg/dL   Creatinine, Ser 1.32 (H) 0.61 - 1.24 mg/dL   Calcium 9.3 8.9 - 10.3 mg/dL   Total Protein 8.1 6.5 - 8.1 g/dL   Albumin 4.2 3.5 - 5.0 g/dL   AST 33 15 - 41 U/L   ALT 20 17 - 63 U/L   Alkaline Phosphatase 48 38 - 126 U/L   Total Bilirubin 1.1 0.3 - 1.2 mg/dL   GFR calc non Af Amer 50 (L) >60 mL/min   GFR calc Af Amer 58 (L) >60 mL/min   Anion gap 9 5 - 15  CBC with Differential  Result Value Ref Range   WBC 7.6 3.8 - 10.6 K/uL   RBC 4.26 (L) 4.40 - 5.90 MIL/uL   Hemoglobin 13.1 13.0 - 18.0 g/dL   HCT 38.5 (L) 40.0 - 52.0 %   MCV 90.2 80.0 - 100.0 fL   MCH 30.7 26.0 - 34.0 pg   MCHC 34.0 32.0 - 36.0  g/dL   RDW 13.9 11.5 - 14.5 %   Platelets 284 150 - 440 K/uL   Neutrophils Relative % 62 %   Neutro Abs 4.7 1.4 - 6.5 K/uL   Lymphocytes Relative 23 %   Lymphs Abs 1.7 1.0 - 3.6 K/uL   Monocytes Relative 11 %   Monocytes Absolute 0.9 0.2 - 1.0 K/uL   Eosinophils Relative 3 %   Eosinophils Absolute 0.3 0 - 0.7 K/uL   Basophils Relative 1 %   Basophils Absolute 0.1 0 - 0.1 K/uL  Brain natriuretic peptide  Result Value Ref Range   B Natriuretic Peptide 64.0 0.0 - 100.0 pg/mL  Troponin I  Result Value Ref Range   Troponin I <0.03 <0.03 ng/mL   Dg Chest 2 View  Result Date: 05/15/2017 CLINICAL DATA:  Pt arrived from home with complaints of shortness of breath since Monday. Pt was seen at primary doctor on Monday who prescribed pt antibiotics. Pt states today he felt worse and was told to come to walk in clinic. PT was then sent to ED after noticing  a room air saturation in the 80s and was put on 2 L,. Pt 96% on 2L in triage pt's breathing labored and lungs sound are not clear. Pt has a productive cough. Former smoker EXAM: CHEST - 2 VIEW COMPARISON:  03/25/2017 FINDINGS: Stable changes from CABG surgery. The cardiac silhouette is normal in size. No mediastinal or hilar masses or evidence of adenopathy. Lungs are hyperexpanded. There is mild reticulonodular scarring at the right apex. No evidence of pneumonia or pulmonary edema. No pleural effusion or pneumothorax. Skeletal structures are intact. IMPRESSION: 1. No acute cardiopulmonary disease. 2. COPD. Electronically Signed   By: Lajean Manes M.D.   On: 05/15/2017 14:48   Dg C-arm 1-60 Min-no Report  Result Date: 04/22/2017 Fluoroscopy was utilized by the requesting physician.  No radiographic interpretation.

## 2017-05-15 NOTE — ED Notes (Signed)
Patient transported to X-ray 

## 2017-05-19 ENCOUNTER — Other Ambulatory Visit: Payer: PPO

## 2017-05-20 ENCOUNTER — Other Ambulatory Visit: Payer: Self-pay

## 2017-05-20 ENCOUNTER — Encounter: Payer: Self-pay | Admitting: Emergency Medicine

## 2017-05-20 ENCOUNTER — Emergency Department: Payer: PPO

## 2017-05-20 ENCOUNTER — Emergency Department
Admission: EM | Admit: 2017-05-20 | Discharge: 2017-05-20 | Disposition: A | Discharge status: Home / Self Care | Payer: PPO | Attending: Emergency Medicine | Admitting: Emergency Medicine

## 2017-05-20 ENCOUNTER — Ambulatory Visit: Admission: RE | Admit: 2017-05-20 | Payer: PPO | Source: Ambulatory Visit | Admitting: Surgery

## 2017-05-20 ENCOUNTER — Telehealth: Payer: Self-pay | Admitting: Surgery

## 2017-05-20 ENCOUNTER — Encounter: Admission: RE | Payer: Self-pay | Source: Ambulatory Visit

## 2017-05-20 DIAGNOSIS — I2510 Atherosclerotic heart disease of native coronary artery without angina pectoris: Secondary | ICD-10-CM | POA: Diagnosis not present

## 2017-05-20 DIAGNOSIS — J9601 Acute respiratory failure with hypoxia: Secondary | ICD-10-CM | POA: Diagnosis not present

## 2017-05-20 DIAGNOSIS — R0602 Shortness of breath: Secondary | ICD-10-CM | POA: Diagnosis not present

## 2017-05-20 DIAGNOSIS — I129 Hypertensive chronic kidney disease with stage 1 through stage 4 chronic kidney disease, or unspecified chronic kidney disease: Secondary | ICD-10-CM | POA: Diagnosis not present

## 2017-05-20 DIAGNOSIS — J441 Chronic obstructive pulmonary disease with (acute) exacerbation: Secondary | ICD-10-CM | POA: Diagnosis not present

## 2017-05-20 DIAGNOSIS — Z7982 Long term (current) use of aspirin: Secondary | ICD-10-CM | POA: Diagnosis not present

## 2017-05-20 DIAGNOSIS — Z87891 Personal history of nicotine dependence: Secondary | ICD-10-CM | POA: Insufficient documentation

## 2017-05-20 DIAGNOSIS — N189 Chronic kidney disease, unspecified: Secondary | ICD-10-CM | POA: Diagnosis not present

## 2017-05-20 DIAGNOSIS — Z7902 Long term (current) use of antithrombotics/antiplatelets: Secondary | ICD-10-CM | POA: Insufficient documentation

## 2017-05-20 DIAGNOSIS — R05 Cough: Secondary | ICD-10-CM | POA: Diagnosis not present

## 2017-05-20 DIAGNOSIS — Z79899 Other long term (current) drug therapy: Secondary | ICD-10-CM | POA: Insufficient documentation

## 2017-05-20 LAB — BASIC METABOLIC PANEL
ANION GAP: 10 (ref 5–15)
BUN: 35 mg/dL — AB (ref 6–20)
CO2: 25 mmol/L (ref 22–32)
Calcium: 9 mg/dL (ref 8.9–10.3)
Chloride: 100 mmol/L — ABNORMAL LOW (ref 101–111)
Creatinine, Ser: 1.44 mg/dL — ABNORMAL HIGH (ref 0.61–1.24)
GFR calc Af Amer: 53 mL/min — ABNORMAL LOW (ref >60)
GFR, EST NON AFRICAN AMERICAN: 45 mL/min — AB (ref >60)
GLUCOSE: 107 mg/dL — AB (ref 65–99)
POTASSIUM: 3.7 mmol/L (ref 3.5–5.1)
Sodium: 135 mmol/L (ref 135–145)

## 2017-05-20 LAB — CBC
HEMATOCRIT: 41.6 % (ref 40.0–52.0)
HEMOGLOBIN: 13.8 g/dL (ref 13.0–18.0)
MCH: 30.1 pg (ref 26.0–34.0)
MCHC: 33.1 g/dL (ref 32.0–36.0)
MCV: 91 fL (ref 80.0–100.0)
Platelets: 429 10*3/uL (ref 150–440)
RBC: 4.57 MIL/uL (ref 4.40–5.90)
RDW: 13.8 % (ref 11.5–14.5)
WBC: 16 10*3/uL — ABNORMAL HIGH (ref 3.8–10.6)

## 2017-05-20 LAB — PROTIME-INR
INR: 0.98
Prothrombin Time: 12.9 seconds (ref 11.4–15.2)

## 2017-05-20 SURGERY — LAPAROSCOPIC CHOLECYSTECTOMY WITH INTRAOPERATIVE CHOLANGIOGRAM
Anesthesia: Choice

## 2017-05-20 MED ORDER — ALBUTEROL SULFATE (2.5 MG/3ML) 0.083% IN NEBU
5.0000 mg | INHALATION_SOLUTION | Freq: Once | RESPIRATORY_TRACT | Status: DC
  Filled 2017-05-20: qty 6

## 2017-05-20 MED ORDER — IPRATROPIUM-ALBUTEROL 0.5-2.5 (3) MG/3ML IN SOLN
3.0000 mL | Freq: Once | RESPIRATORY_TRACT | Status: AC
  Administered 2017-05-20: 3 mL via RESPIRATORY_TRACT
  Filled 2017-05-20: qty 3

## 2017-05-20 MED ORDER — PREDNISONE 10 MG (21) PO TBPK: ORAL_TABLET | ORAL | 0 refills | Status: DC

## 2017-05-20 MED ORDER — PREDNISONE 20 MG PO TABS
60.0000 mg | ORAL_TABLET | Freq: Once | ORAL | Status: AC
Start: 2017-05-20 — End: 2017-05-20
  Administered 2017-05-20: 60 mg via ORAL
  Filled 2017-05-20: qty 3

## 2017-05-20 NOTE — Telephone Encounter (Signed)
Called and spoke with patient, patient stated he was fixing to go see his other doctor and asked if he could call us back and schedule an appointment.

## 2017-05-20 NOTE — ED Triage Notes (Signed)
Pts wife reports that was here last Thursday for SOB, he went to his PMD, today for a follow up, his O2 was in the 80's so they sent him here to be seen.

## 2017-05-20 NOTE — Telephone Encounter (Signed)
-----   Message from Mickie Kay sent at 05/19/2017 10:42 AM EDT ----- Please put patient on schedule to follow up with Dr Hampton Abbot between 4/10-4/12 to discuss a new surgery date. Patient had to cancel bc he was sick.  ----- Message ----- From: Olean Ree, MD Sent: 05/15/2017   7:27 PM To: Albin Felling Brouillard  Hi Angie,  Thanks for letting me know.  Hope he feels better soon!  As a precaution so he doesn't get lost to follow up, can we schedule him to see me in the office sometime on 4/10-4/12?  Thanks,  [employer]

## 2017-05-20 NOTE — Discharge Instructions (Signed)
Please seek medical attention for any high fevers, chest pain, shortness of breath, change in behavior, persistent vomiting, bloody stool or any other new or concerning symptoms.  

## 2017-05-20 NOTE — ED Provider Notes (Signed)
[facility] Emergency Department Provider Note   ____________________________________________   I have reviewed the triage vital signs and the nursing notes.   HISTORY  Chief Complaint Shortness of Breath and Cough   History limited by: Not Limited   HPI Jason House is a 77 y.o. male who presents to the emergency department today from doctors office because of concern for low oxygen on vital signs. The patient had recently been seen in the emergency department and treated for COPD exacerbation. Since leaving the emergency department the patient states his breathing has gotten slightly worse. He states it is worse with exertion. He denies any chest pain. Denies any fevers.   Per medical record review patient has a history of CAD, HLD, HTN.  Past Medical History:  Diagnosis Date  . Anemia   . Coronary artery disease   . Glaucoma   . H/O ETOH abuse   . History of shingles   . Hyperlipidemia   . Hypertension     Patient Active Problem List   Diagnosis Date Noted  . Calculus of bile duct without cholecystitis and without obstruction   . Encounter for fitting and adjustment of other gastrointestinal appliance and device   . Gram-negative bacteremia 04/18/2017  . Atherosclerotic heart disease of native coronary artery without angina pectoris 04/08/2017  . Chronic kidney disease 04/08/2017  . Glaucoma 04/08/2017  . Choledocholithiasis 03/27/2017  . Jaundice   . Acute pancreatitis   . Bilateral carotid artery stenosis 01/28/2017  . Hyperlipidemia 01/28/2017  . Essential hypertension 01/28/2017  . Anemia, unspecified 04/22/2012    Past Surgical History:  Procedure Laterality Date  . COLONOSCOPY WITH PROPOFOL N/A 09/23/2014   Procedure: COLONOSCOPY WITH PROPOFOL;  Surgeon: Josefine Class, MD;  Location: [facility] ENDOSCOPY;  Service: Endoscopy;  Laterality: N/A;  . CORONARY ANGIOPLASTY    . CORONARY ARTERY BYPASS GRAFT    . ERCP N/A 04/22/2017    Procedure: ENDOSCOPIC RETROGRADE CHOLANGIOPANCREATOGRAPHY (ERCP);  Surgeon: Lucilla Lame, MD;  Location: [facility] ENDOSCOPY;  Service: Endoscopy;  Laterality: N/A;  . EYE SURGERY      Prior to Admission medications   Medication Sig Start Date End Date Taking? Authorizing Provider  Albuterol Sulfate (PROAIR RESPICLICK) 010 (90 Base) MCG/ACT AEPB Inhale 1 puff into the lungs every 4 (four) hours as needed (shortness of breath). 05/15/17   Darel Hong, MD  amLODipine (NORVASC) 5 MG tablet Take 1 tablet by mouth daily. 02/01/17   [provider]  aspirin EC 81 MG tablet Take 81 mg by mouth daily.    [provider]  atorvastatin (LIPITOR) 80 MG tablet Take 80 mg by mouth daily.    [provider]  azithromycin (ZITHROMAX Z-PAK) 250 MG tablet Take 2 tablets (500 mg) on  Day 1,  followed by 1 tablet (250 mg) once daily on Days 2 through 5. 05/15/17   Darel Hong, MD  clopidogrel (PLAVIX) 75 MG tablet Take 75 mg by mouth daily.    [provider]  cyclobenzaprine (FLEXERIL) 10 MG tablet Take 10 mg by mouth 3 (three) times daily as needed for muscle spasms.    [provider]  dorzolamide-timolol (COSOPT) 22.3-6.8 MG/ML ophthalmic solution Place 1 drop into both eyes 2 (two) times daily.    [provider]  hydrochlorothiazide (MICROZIDE) 12.5 MG capsule Take 12.5 mg by mouth every evening.    [provider]  latanoprost (XALATAN) 0.005 % ophthalmic solution Place 1 drop into both eyes at bedtime.  [provider]  levothyroxine (SYNTHROID, LEVOTHROID) 50 MCG tablet Take 1 tablet by mouth daily before breakfast.  01/04/17   [provider]  lisinopril (PRINIVIL,ZESTRIL) 40 MG tablet Take 40 mg by mouth daily.    [provider]  metoprolol succinate (TOPROL-XL) 25 MG 24 hr tablet Take 25 mg by mouth daily.  04/09/17   [provider]  Spacer/Aero Chamber Mouthpiece MISC 1 Units by Does not apply route every  4 (four) hours as needed (wheezing). 05/15/17   Darel Hong, MD  vitamin B-12 (CYANOCOBALAMIN) 1000 MCG tablet Take 1,000 mcg by mouth daily.    [provider]    Allergies Patient has no known allergies.  Family History  Problem Relation Age of Onset  . Diabetes Mother   . Cancer Sister   . Throat cancer Brother   . Cirrhosis Brother   . COPD Sister   . COPD Sister   . Diabetes Sister     Social History Social History   Tobacco Use  . Smoking status: Former Research scientist (life sciences)  . Smokeless tobacco: Never Used  Substance Use Topics  . Alcohol use: No    Frequency: Never  . Drug use: No    Review of Systems Constitutional: No fever/chills Eyes: No visual changes. ENT: No sore throat. Cardiovascular: Denies chest pain. Respiratory: Positive for shortness of breath. Gastrointestinal: No abdominal pain.  No nausea, no vomiting.  No diarrhea.   Genitourinary: Negative for dysuria. Musculoskeletal: Negative for back pain. Skin: Negative for rash. Neurological: Negative for headaches, focal weakness or numbness.  ____________________________________________   PHYSICAL EXAM:  VITAL SIGNS: ED Triage Vitals  Enc Vitals Group     BP 05/20/17 1509 (!) 119/58     Pulse Rate 05/20/17 1509 89     Resp --      Temp 05/20/17 1515 (!) 97.5 F (36.4 C)     Temp Source 05/20/17 1509 Oral     SpO2 05/20/17 1509 92 %     Weight 05/20/17 1509 142 lb (64.4 kg)     Height 05/20/17 1509 5\' 7"  (1.702 m)     Head Circumference --      Peak Flow --      Pain Score 05/20/17 1517 0    Constitutional: Alert and oriented. Well appearing and in no distress. Eyes: Conjunctivae are normal.  ENT   Head: Normocephalic and atraumatic.   Nose: No congestion/rhinnorhea.   Mouth/Throat: Mucous membranes are moist.   Neck: No stridor. Hematological/Lymphatic/Immunilogical: No cervical lymphadenopathy. Cardiovascular: Normal rate, regular rhythm.  No murmurs, rubs, or gallops.   Respiratory: Normal respiratory effort without tachypnea nor retractions. Slight expiratory wheezing appreciated.  Gastrointestinal: Soft and non tender. No rebound. No guarding.  Genitourinary: Deferred Musculoskeletal: Normal range of motion in all extremities. No lower extremity edema. Neurologic:  Normal speech and language. No gross focal neurologic deficits are appreciated.  Skin:  Skin is warm, dry and intact. No rash noted. Psychiatric: Mood and affect are normal. Speech and behavior are normal. Patient exhibits appropriate insight and judgment.  ____________________________________________    LABS (pertinent positives/negatives)  CBC wbc 16.0, hgb 13.8, plt 429 BMP na 135, k 3.7, cr 1.44 ____________________________________________   EKG  I, Nance Pear, attending physician, personally viewed and interpreted this EKG  EKG Time: 1527 Rate: 93 Rhythm: normal sinus rhythm Axis: normal Intervals: qtc 412 QRS: narrow ST changes: no st elevation Impression: normal ekg ____________________________________________    RADIOLOGY  CXR No acute disease  ____________________________________________  PROCEDURES  Procedures  ____________________________________________   INITIAL IMPRESSION / ASSESSMENT AND PLAN / ED COURSE  Pertinent labs & imaging results that were available during my care of the patient were reviewed by me and considered in my medical decision making (see chart for details).  Patient sent from PCP because of concern for SOB and hypoxia. Patient was not hypoxic here. Was given breathing treatments. CXR did not show any pneumonia. Patient was had ambulatory O2 sats checked without hypoxia. Think likely COPD exacerbation. Will discharge with prednisone. Instructed patient that it is important to continue to use nebulizer machine.  ____________________________________________   FINAL CLINICAL IMPRESSION(S) / ED DIAGNOSES  Final diagnoses:   COPD exacerbation ([place])     Note: This dictation was prepared with Dragon dictation. Any transcriptional errors that result from this process are unintentional     Nance Pear, MD 05/20/17 2110

## 2017-05-20 NOTE — ED Notes (Signed)
Pt up to ambulate in the hall way and maintained O2 sat of 94% or higher.

## 2017-05-22 NOTE — Telephone Encounter (Signed)
Spoke with patient said he still was not feeling well and did not want to schedule anything at this time, I told the patient I would call him back about the first week of April to see if he would like to schedule for the next week with Dr. Hampton Abbot.

## 2017-06-02 DIAGNOSIS — R600 Localized edema: Secondary | ICD-10-CM | POA: Diagnosis not present

## 2017-06-02 DIAGNOSIS — L0100 Impetigo, unspecified: Secondary | ICD-10-CM | POA: Diagnosis not present

## 2017-06-05 NOTE — Telephone Encounter (Signed)
Spoke with patient again, patient isn't ready to schedule an appointment to come back in yet, but asked for me to call him sometime next week.

## 2017-06-17 DIAGNOSIS — Z0000 Encounter for general adult medical examination without abnormal findings: Secondary | ICD-10-CM | POA: Diagnosis not present

## 2017-06-17 DIAGNOSIS — I739 Peripheral vascular disease, unspecified: Secondary | ICD-10-CM | POA: Diagnosis not present

## 2017-06-17 DIAGNOSIS — I10 Essential (primary) hypertension: Secondary | ICD-10-CM | POA: Diagnosis not present

## 2017-06-24 NOTE — Telephone Encounter (Signed)
Spoke with patients wife at this time, said her husband ran into town and would have him call back as soon as he could.

## 2017-07-08 NOTE — Telephone Encounter (Signed)
Unable to leave a message on patients voicemail to call the office.

## 2017-07-11 DIAGNOSIS — I340 Nonrheumatic mitral (valve) insufficiency: Secondary | ICD-10-CM | POA: Diagnosis not present

## 2017-07-11 DIAGNOSIS — E782 Mixed hyperlipidemia: Secondary | ICD-10-CM | POA: Diagnosis not present

## 2017-07-11 DIAGNOSIS — I10 Essential (primary) hypertension: Secondary | ICD-10-CM | POA: Diagnosis not present

## 2017-07-11 DIAGNOSIS — I7389 Other specified peripheral vascular diseases: Secondary | ICD-10-CM | POA: Diagnosis not present

## 2017-07-11 DIAGNOSIS — R0602 Shortness of breath: Secondary | ICD-10-CM | POA: Diagnosis not present

## 2017-07-11 DIAGNOSIS — I2510 Atherosclerotic heart disease of native coronary artery without angina pectoris: Secondary | ICD-10-CM | POA: Diagnosis not present

## 2017-07-11 DIAGNOSIS — I25810 Atherosclerosis of coronary artery bypass graft(s) without angina pectoris: Secondary | ICD-10-CM | POA: Diagnosis not present

## 2017-07-15 DIAGNOSIS — I2510 Atherosclerotic heart disease of native coronary artery without angina pectoris: Secondary | ICD-10-CM | POA: Diagnosis not present

## 2017-07-15 DIAGNOSIS — Z951 Presence of aortocoronary bypass graft: Secondary | ICD-10-CM | POA: Diagnosis not present

## 2017-07-15 DIAGNOSIS — I10 Essential (primary) hypertension: Secondary | ICD-10-CM | POA: Diagnosis not present

## 2017-07-15 DIAGNOSIS — I7389 Other specified peripheral vascular diseases: Secondary | ICD-10-CM | POA: Diagnosis not present

## 2017-07-15 DIAGNOSIS — R0602 Shortness of breath: Secondary | ICD-10-CM | POA: Diagnosis not present

## 2017-07-15 DIAGNOSIS — R079 Chest pain, unspecified: Secondary | ICD-10-CM | POA: Diagnosis not present

## 2017-07-15 DIAGNOSIS — I340 Nonrheumatic mitral (valve) insufficiency: Secondary | ICD-10-CM | POA: Diagnosis not present

## 2017-07-15 DIAGNOSIS — E782 Mixed hyperlipidemia: Secondary | ICD-10-CM | POA: Diagnosis not present

## 2017-07-15 DIAGNOSIS — I25810 Atherosclerosis of coronary artery bypass graft(s) without angina pectoris: Secondary | ICD-10-CM | POA: Diagnosis not present

## 2017-07-18 DIAGNOSIS — I340 Nonrheumatic mitral (valve) insufficiency: Secondary | ICD-10-CM | POA: Diagnosis not present

## 2017-07-18 DIAGNOSIS — Z951 Presence of aortocoronary bypass graft: Secondary | ICD-10-CM | POA: Diagnosis not present

## 2017-07-18 DIAGNOSIS — I2510 Atherosclerotic heart disease of native coronary artery without angina pectoris: Secondary | ICD-10-CM | POA: Diagnosis not present

## 2017-07-18 DIAGNOSIS — R079 Chest pain, unspecified: Secondary | ICD-10-CM | POA: Diagnosis not present

## 2017-07-18 DIAGNOSIS — I10 Essential (primary) hypertension: Secondary | ICD-10-CM | POA: Diagnosis not present

## 2017-07-18 DIAGNOSIS — I7389 Other specified peripheral vascular diseases: Secondary | ICD-10-CM | POA: Diagnosis not present

## 2017-07-18 DIAGNOSIS — I25810 Atherosclerosis of coronary artery bypass graft(s) without angina pectoris: Secondary | ICD-10-CM | POA: Diagnosis not present

## 2017-07-18 DIAGNOSIS — R0602 Shortness of breath: Secondary | ICD-10-CM | POA: Diagnosis not present

## 2017-07-18 DIAGNOSIS — E782 Mixed hyperlipidemia: Secondary | ICD-10-CM | POA: Diagnosis not present

## 2017-07-22 DIAGNOSIS — E782 Mixed hyperlipidemia: Secondary | ICD-10-CM | POA: Diagnosis not present

## 2017-07-22 DIAGNOSIS — R7309 Other abnormal glucose: Secondary | ICD-10-CM | POA: Diagnosis not present

## 2017-07-22 DIAGNOSIS — Z1211 Encounter for screening for malignant neoplasm of colon: Secondary | ICD-10-CM | POA: Diagnosis not present

## 2017-07-22 DIAGNOSIS — Z125 Encounter for screening for malignant neoplasm of prostate: Secondary | ICD-10-CM | POA: Diagnosis not present

## 2017-07-22 DIAGNOSIS — E039 Hypothyroidism, unspecified: Secondary | ICD-10-CM | POA: Insufficient documentation

## 2017-07-22 DIAGNOSIS — Z79899 Other long term (current) drug therapy: Secondary | ICD-10-CM | POA: Diagnosis not present

## 2017-07-22 DIAGNOSIS — Z0000 Encounter for general adult medical examination without abnormal findings: Secondary | ICD-10-CM | POA: Diagnosis not present

## 2017-07-22 DIAGNOSIS — M546 Pain in thoracic spine: Secondary | ICD-10-CM | POA: Diagnosis not present

## 2017-07-22 DIAGNOSIS — I10 Essential (primary) hypertension: Secondary | ICD-10-CM | POA: Diagnosis not present

## 2017-07-29 ENCOUNTER — Ambulatory Visit (INDEPENDENT_AMBULATORY_CARE_PROVIDER_SITE_OTHER): Payer: PPO

## 2017-07-29 ENCOUNTER — Ambulatory Visit (INDEPENDENT_AMBULATORY_CARE_PROVIDER_SITE_OTHER): Payer: PPO | Admitting: Vascular Surgery

## 2017-07-29 ENCOUNTER — Encounter (INDEPENDENT_AMBULATORY_CARE_PROVIDER_SITE_OTHER): Payer: Self-pay | Admitting: Vascular Surgery

## 2017-07-29 VITALS — BP 130/61 | HR 58 | Resp 16 | Ht 66.5 in | Wt 161.0 lb

## 2017-07-29 DIAGNOSIS — I10 Essential (primary) hypertension: Secondary | ICD-10-CM

## 2017-07-29 DIAGNOSIS — E785 Hyperlipidemia, unspecified: Secondary | ICD-10-CM | POA: Diagnosis not present

## 2017-07-29 DIAGNOSIS — I6523 Occlusion and stenosis of bilateral carotid arteries: Secondary | ICD-10-CM | POA: Diagnosis not present

## 2017-07-29 NOTE — Progress Notes (Signed)
Subjective:    Patient ID: Jason House, male    DOB: March 22, 1940, 77 y.o.   MRN: [id number] Chief Complaint  Patient presents with  . Follow-up    6 month carotid check   Patient presents for a six month non-invasive study follow up for carotid stenosis. The stenosis has been followed by surveillance duplexes. The patient underwent a bilateral carotid duplex scan which showed no change from the previous exam on 01/03/17. Duplex is stable at Right ICA stenosis (60-79%) and Left ICA stenosis (40-59%). The patient denies experiencing Amaurosis Fugax, TIA like symptoms or focal motor deficits.  Patient denies any fever, nausea vomiting.  Review of Systems  Constitutional: Negative.   HENT: Negative.   Eyes: Negative.   Respiratory: Negative.   Cardiovascular:       Carotid stenosis  Gastrointestinal: Negative.   Endocrine: Negative.   Genitourinary: Negative.   Musculoskeletal: Negative.   Skin: Negative.   Allergic/Immunologic: Negative.   Neurological: Negative.   Hematological: Negative.   Psychiatric/Behavioral: Negative.       Objective:   Physical Exam  Constitutional: He is oriented to person, place, and time. He appears well-developed and well-nourished. No distress.  HENT:  Head: Normocephalic and atraumatic.  Right Ear: External ear normal.  Left Ear: External ear normal.  Eyes: Pupils are equal, round, and reactive to light. Conjunctivae and EOM are normal.  Neck: Normal range of motion.  Right-sided bruit noted  Cardiovascular: Normal rate, regular rhythm, normal heart sounds and intact distal pulses.  Pulses:      Radial pulses are 2+ on the right side, and 2+ on the left side.  Pulmonary/Chest: Effort normal and breath sounds normal.  Musculoskeletal: Normal range of motion. He exhibits no edema.  Neurological: He is alert and oriented to person, place, and time.  Skin: Skin is warm and dry. He is not diaphoretic.  Psychiatric: He has a normal mood and  affect. His behavior is normal. Judgment and thought content normal.  Vitals reviewed.  BP 130/61 (BP Location: Right Arm, Patient Position: Sitting)   Pulse (!) 58   Resp 16   Ht 5' 6.5" (1.689 m)   Wt 161 lb (73 kg)   BMI 25.60 kg/m   Past Medical History:  Diagnosis Date  . Anemia   . Coronary artery disease   . Glaucoma   . H/O ETOH abuse   . History of shingles   . Hyperlipidemia   . Hypertension    Social History   Socioeconomic History  . Marital status: Married    Spouse name: Not on file  . Number of children: Not on file  . Years of education: Not on file  . Highest education level: Not on file  Occupational History  . Not on file  Social Needs  . Financial resource strain: Not on file  . Food insecurity:    Worry: Not on file    Inability: Not on file  . Transportation needs:    Medical: Not on file    Non-medical: Not on file  Tobacco Use  . Smoking status: Former Research scientist (life sciences)  . Smokeless tobacco: Never Used  Substance and Sexual Activity  . Alcohol use: No    Frequency: Never  . Drug use: No  . Sexual activity: Not on file  Lifestyle  . Physical activity:    Days per week: Not on file    Minutes per session: Not on file  . Stress: Not on file  Relationships  .  Social connections:    Talks on phone: Not on file    Gets together: Not on file    Attends religious service: Not on file    Active member of club or organization: Not on file    Attends meetings of clubs or organizations: Not on file    Relationship status: Not on file  . Intimate partner violence:    Fear of current or ex partner: Not on file    Emotionally abused: Not on file    Physically abused: Not on file    Forced sexual activity: Not on file  Other Topics Concern  . Not on file  Social History Narrative  . Not on file   Past Surgical History:  Procedure Laterality Date  . COLONOSCOPY WITH PROPOFOL N/A 09/23/2014   Procedure: COLONOSCOPY WITH PROPOFOL;  Surgeon: Josefine Class, MD;  Location: [facility] ENDOSCOPY;  Service: Endoscopy;  Laterality: N/A;  . CORONARY ANGIOPLASTY    . CORONARY ARTERY BYPASS GRAFT    . ERCP N/A 04/22/2017   Procedure: ENDOSCOPIC RETROGRADE CHOLANGIOPANCREATOGRAPHY (ERCP);  Surgeon: Lucilla Lame, MD;  Location: [facility] ENDOSCOPY;  Service: Endoscopy;  Laterality: N/A;  . EYE SURGERY     Family History  Problem Relation Age of Onset  . Diabetes Mother   . Cancer Sister   . Throat cancer Brother   . Cirrhosis Brother   . COPD Sister   . COPD Sister   . Diabetes Sister    No Known Allergies     Assessment & Plan:  Patient presents for a six month non-invasive study follow up for carotid stenosis. The stenosis has been followed by surveillance duplexes. The patient underwent a bilateral carotid duplex scan which showed no change from the previous exam on 01/03/17. Duplex is stable at Right ICA stenosis (60-79%) and Left ICA stenosis (40-59%). The patient denies experiencing Amaurosis Fugax, TIA like symptoms or focal motor deficits.  Patient denies any fever, nausea vomiting.  1. Bilateral carotid artery stenosis - Stable Studies reviewed with patient. Patient asymptomatic with stable duplex. No intervention at this time. Patient to return in six month for surveillance carotid duplex. I have discussed with the patient at length the risk factors for and pathogenesis of atherosclerotic disease and encouraged a healthy diet, regular exercise regimen and blood pressure / glucose control.  Patient was instructed to contact our office in the interim with problems such as arm / leg weakness or numbness, speech / swallowing difficulty or temporary monocular blindness. The patient expresses their understanding.  2. Hyperlipidemia, unspecified hyperlipidemia type - Stable Encouraged good control as its slows the progression of atherosclerotic disease  3. Essential hypertension - Stable Encouraged good control as its slows the progression  of atherosclerotic disease  Current Outpatient Medications on File Prior to Visit  Medication Sig Dispense Refill  . Albuterol Sulfate (PROAIR RESPICLICK) 160 (90 Base) MCG/ACT AEPB Inhale 1 puff into the lungs every 4 (four) hours as needed (shortness of breath). 1 each 0  . amLODipine (NORVASC) 5 MG tablet Take 1 tablet by mouth daily.    Marland Kitchen aspirin EC 81 MG tablet Take 81 mg by mouth daily.    Marland Kitchen atorvastatin (LIPITOR) 80 MG tablet Take 80 mg by mouth daily.    . clopidogrel (PLAVIX) 75 MG tablet Take 75 mg by mouth daily.    . cyclobenzaprine (FLEXERIL) 10 MG tablet Take 10 mg by mouth 3 (three) times daily as needed for muscle spasms.    . dorzolamide-timolol (  COSOPT) 22.3-6.8 MG/ML ophthalmic solution Place 1 drop into both eyes 2 (two) times daily.    Marland Kitchen latanoprost (XALATAN) 0.005 % ophthalmic solution Place 1 drop into both eyes at bedtime.    Marland Kitchen levothyroxine (SYNTHROID, LEVOTHROID) 50 MCG tablet Take 1 tablet by mouth daily before breakfast.     . lisinopril (PRINIVIL,ZESTRIL) 40 MG tablet Take 40 mg by mouth daily.    . metoprolol succinate (TOPROL-XL) 25 MG 24 hr tablet Take 25 mg by mouth daily.     . predniSONE (STERAPRED UNI-PAK 21 TAB) 10 MG (21) TBPK tablet Dispense per packaging instructions 21 tablet 0  . Spacer/Aero Chamber Mouthpiece MISC 1 Units by Does not apply route every 4 (four) hours as needed (wheezing). 1 each 0  . vitamin B-12 (CYANOCOBALAMIN) 1000 MCG tablet Take 1,000 mcg by mouth daily.    Marland Kitchen azithromycin (ZITHROMAX Z-PAK) 250 MG tablet Take 2 tablets (500 mg) on  Day 1,  followed by 1 tablet (250 mg) once daily on Days 2 through 5. (Patient not taking: Reported on 05/20/2017) 6 each 0  . hydrochlorothiazide (MICROZIDE) 12.5 MG capsule Take 12.5 mg by mouth every evening.     No current facility-administered medications on file prior to visit.    There are no Patient Instructions on file for this visit. No follow-ups on file.  KIMBERLY A STEGMAYER, PA-C

## 2017-08-05 DIAGNOSIS — Z1211 Encounter for screening for malignant neoplasm of colon: Secondary | ICD-10-CM | POA: Diagnosis not present

## 2017-09-22 DIAGNOSIS — H401121 Primary open-angle glaucoma, left eye, mild stage: Secondary | ICD-10-CM | POA: Diagnosis not present

## 2017-10-21 DIAGNOSIS — I7389 Other specified peripheral vascular diseases: Secondary | ICD-10-CM | POA: Diagnosis not present

## 2017-10-21 DIAGNOSIS — I25810 Atherosclerosis of coronary artery bypass graft(s) without angina pectoris: Secondary | ICD-10-CM | POA: Diagnosis not present

## 2017-10-21 DIAGNOSIS — R609 Edema, unspecified: Secondary | ICD-10-CM | POA: Diagnosis not present

## 2017-10-21 DIAGNOSIS — I340 Nonrheumatic mitral (valve) insufficiency: Secondary | ICD-10-CM | POA: Diagnosis not present

## 2017-10-21 DIAGNOSIS — E782 Mixed hyperlipidemia: Secondary | ICD-10-CM | POA: Diagnosis not present

## 2017-10-21 DIAGNOSIS — R0602 Shortness of breath: Secondary | ICD-10-CM | POA: Diagnosis not present

## 2017-10-21 DIAGNOSIS — I2510 Atherosclerotic heart disease of native coronary artery without angina pectoris: Secondary | ICD-10-CM | POA: Diagnosis not present

## 2017-10-21 DIAGNOSIS — I10 Essential (primary) hypertension: Secondary | ICD-10-CM | POA: Diagnosis not present

## 2017-10-24 DIAGNOSIS — R609 Edema, unspecified: Secondary | ICD-10-CM | POA: Diagnosis not present

## 2017-11-07 DIAGNOSIS — R936 Abnormal findings on diagnostic imaging of limbs: Secondary | ICD-10-CM | POA: Diagnosis not present

## 2017-11-10 DIAGNOSIS — R936 Abnormal findings on diagnostic imaging of limbs: Secondary | ICD-10-CM | POA: Diagnosis not present

## 2017-11-10 DIAGNOSIS — E875 Hyperkalemia: Secondary | ICD-10-CM | POA: Diagnosis not present

## 2017-11-21 DIAGNOSIS — I7389 Other specified peripheral vascular diseases: Secondary | ICD-10-CM | POA: Diagnosis not present

## 2017-11-21 DIAGNOSIS — I2510 Atherosclerotic heart disease of native coronary artery without angina pectoris: Secondary | ICD-10-CM | POA: Diagnosis not present

## 2017-11-21 DIAGNOSIS — I25810 Atherosclerosis of coronary artery bypass graft(s) without angina pectoris: Secondary | ICD-10-CM | POA: Diagnosis not present

## 2017-11-21 DIAGNOSIS — R079 Chest pain, unspecified: Secondary | ICD-10-CM | POA: Diagnosis not present

## 2017-11-21 DIAGNOSIS — E782 Mixed hyperlipidemia: Secondary | ICD-10-CM | POA: Diagnosis not present

## 2017-11-21 DIAGNOSIS — R0602 Shortness of breath: Secondary | ICD-10-CM | POA: Diagnosis not present

## 2017-11-21 DIAGNOSIS — I340 Nonrheumatic mitral (valve) insufficiency: Secondary | ICD-10-CM | POA: Diagnosis not present

## 2017-11-21 DIAGNOSIS — I10 Essential (primary) hypertension: Secondary | ICD-10-CM | POA: Diagnosis not present

## 2017-12-05 DIAGNOSIS — I2510 Atherosclerotic heart disease of native coronary artery without angina pectoris: Secondary | ICD-10-CM | POA: Diagnosis not present

## 2017-12-05 DIAGNOSIS — R42 Dizziness and giddiness: Secondary | ICD-10-CM | POA: Diagnosis not present

## 2017-12-05 DIAGNOSIS — I10 Essential (primary) hypertension: Secondary | ICD-10-CM | POA: Diagnosis not present

## 2017-12-05 DIAGNOSIS — E782 Mixed hyperlipidemia: Secondary | ICD-10-CM | POA: Diagnosis not present

## 2017-12-05 DIAGNOSIS — N182 Chronic kidney disease, stage 2 (mild): Secondary | ICD-10-CM | POA: Diagnosis not present

## 2017-12-19 DIAGNOSIS — N182 Chronic kidney disease, stage 2 (mild): Secondary | ICD-10-CM | POA: Diagnosis not present

## 2017-12-19 DIAGNOSIS — I10 Essential (primary) hypertension: Secondary | ICD-10-CM | POA: Diagnosis not present

## 2017-12-19 DIAGNOSIS — I340 Nonrheumatic mitral (valve) insufficiency: Secondary | ICD-10-CM | POA: Diagnosis not present

## 2017-12-19 DIAGNOSIS — R079 Chest pain, unspecified: Secondary | ICD-10-CM | POA: Diagnosis not present

## 2017-12-19 DIAGNOSIS — E782 Mixed hyperlipidemia: Secondary | ICD-10-CM | POA: Diagnosis not present

## 2017-12-19 DIAGNOSIS — I6521 Occlusion and stenosis of right carotid artery: Secondary | ICD-10-CM | POA: Diagnosis not present

## 2017-12-19 DIAGNOSIS — R42 Dizziness and giddiness: Secondary | ICD-10-CM | POA: Diagnosis not present

## 2017-12-24 DIAGNOSIS — I6521 Occlusion and stenosis of right carotid artery: Secondary | ICD-10-CM | POA: Diagnosis not present

## 2017-12-30 DIAGNOSIS — E782 Mixed hyperlipidemia: Secondary | ICD-10-CM | POA: Diagnosis not present

## 2017-12-30 DIAGNOSIS — I2510 Atherosclerotic heart disease of native coronary artery without angina pectoris: Secondary | ICD-10-CM | POA: Diagnosis not present

## 2017-12-30 DIAGNOSIS — I25810 Atherosclerosis of coronary artery bypass graft(s) without angina pectoris: Secondary | ICD-10-CM | POA: Diagnosis not present

## 2017-12-30 DIAGNOSIS — I10 Essential (primary) hypertension: Secondary | ICD-10-CM | POA: Diagnosis not present

## 2017-12-30 DIAGNOSIS — I340 Nonrheumatic mitral (valve) insufficiency: Secondary | ICD-10-CM | POA: Diagnosis not present

## 2017-12-30 DIAGNOSIS — R079 Chest pain, unspecified: Secondary | ICD-10-CM | POA: Diagnosis not present

## 2018-01-02 ENCOUNTER — Other Ambulatory Visit: Payer: Self-pay

## 2018-01-09 ENCOUNTER — Encounter (INDEPENDENT_AMBULATORY_CARE_PROVIDER_SITE_OTHER): Payer: Self-pay | Admitting: Vascular Surgery

## 2018-01-09 ENCOUNTER — Ambulatory Visit (INDEPENDENT_AMBULATORY_CARE_PROVIDER_SITE_OTHER): Payer: PPO | Admitting: Vascular Surgery

## 2018-01-09 VITALS — BP 137/53 | HR 47 | Resp 15 | Ht 67.0 in | Wt 155.0 lb

## 2018-01-09 DIAGNOSIS — I6523 Occlusion and stenosis of bilateral carotid arteries: Secondary | ICD-10-CM | POA: Diagnosis not present

## 2018-01-09 DIAGNOSIS — I10 Essential (primary) hypertension: Secondary | ICD-10-CM

## 2018-01-09 DIAGNOSIS — I2510 Atherosclerotic heart disease of native coronary artery without angina pectoris: Secondary | ICD-10-CM

## 2018-01-09 NOTE — Progress Notes (Signed)
MRN : [id number]  Jason House is a 77 y.o. ([birth year]) male who presents with chief complaint of  Chief Complaint  Patient presents with  . Follow-up    Carotid Stenosis  .  History of Present Illness: Patient is referred back prior to his scheduled visit by his primary care physician and cardiologist for carotid artery stenosis.  No new symptoms of his carotid disease. Specifically, the patient denies amaurosis fugax, speech or swallowing difficulties, or arm or leg weakness or numbness The patient has had slow progression of his right carotid artery stenosis and now appears to be in the 75% or greater range by CT angiogram.  I have independently reviewed his CT angiogram.  The patient has mild to moderate left carotid artery stenosis but this is nowhere near a surgical range as of yet.  Current Outpatient Medications  Medication Sig Dispense Refill  . colchicine 0.6 MG tablet Take 2 tablets (1.2mg ) by mouth at first sign of gout flare followed by 1 tablet (0.6mg ) after 1 hour. (Max 1.8mg  within 1 hour)    . Albuterol Sulfate (PROAIR RESPICLICK) 027 (90 Base) MCG/ACT AEPB Inhale 1 puff into the lungs every 4 (four) hours as needed (shortness of breath). 1 each 0  . amLODipine (NORVASC) 10 MG tablet Take 10 mg by mouth daily.  1  . aspirin EC 81 MG tablet Take 81 mg by mouth daily.    Marland Kitchen atorvastatin (LIPITOR) 80 MG tablet Take 80 mg by mouth daily.    Marland Kitchen BYSTOLIC 5 MG tablet Take 5 mg by mouth daily.  1  . clopidogrel (PLAVIX) 75 MG tablet Take 75 mg by mouth daily.    . cyclobenzaprine (FLEXERIL) 10 MG tablet Take 10 mg by mouth 3 (three) times daily as needed for muscle spasms.    . dorzolamide-timolol (COSOPT) 22.3-6.8 MG/ML ophthalmic solution Place 1 drop into both eyes 2 (two) times daily.    . hydrochlorothiazide (MICROZIDE) 12.5 MG capsule Take 12.5 mg by mouth every evening.    . latanoprost (XALATAN) 0.005 % ophthalmic solution Place 1 drop into both eyes at bedtime.    Marland Kitchen  levothyroxine (SYNTHROID, LEVOTHROID) 50 MCG tablet Take 1 tablet by mouth daily before breakfast.     . lisinopril (PRINIVIL,ZESTRIL) 10 MG tablet Take 10 mg by mouth daily.  1  . lisinopril (PRINIVIL,ZESTRIL) 40 MG tablet Take 40 mg by mouth daily.    . metoprolol succinate (TOPROL-XL) 25 MG 24 hr tablet Take 25 mg by mouth daily.     . predniSONE (STERAPRED UNI-PAK 21 TAB) 10 MG (21) TBPK tablet Dispense per packaging instructions 21 tablet 0  . ranolazine (RANEXA) 1000 MG SR tablet TAKE TABLET BY MOUTH TWICE DAILY  2  . Spacer/Aero Chamber Mouthpiece MISC 1 Units by Does not apply route every 4 (four) hours as needed (wheezing). 1 each 0  . vitamin B-12 (CYANOCOBALAMIN) 1000 MCG tablet Take 1,000 mcg by mouth daily.     No current facility-administered medications for this visit.     Past Medical History:  Diagnosis Date  . Anemia   . Coronary artery disease   . Glaucoma   . H/O ETOH abuse   . History of shingles   . Hyperlipidemia   . Hypertension     Past Surgical History:  Procedure Laterality Date  . COLONOSCOPY WITH PROPOFOL N/A 09/23/2014   Procedure: COLONOSCOPY WITH PROPOFOL;  Surgeon: Josefine Class, MD;  Location: [facility] ENDOSCOPY;  Service: Endoscopy;  Laterality:  N/A;  . CORONARY ANGIOPLASTY    . CORONARY ARTERY BYPASS GRAFT    . ERCP N/A 04/22/2017   Procedure: ENDOSCOPIC RETROGRADE CHOLANGIOPANCREATOGRAPHY (ERCP);  Surgeon: Lucilla Lame, MD;  Location: [facility] ENDOSCOPY;  Service: Endoscopy;  Laterality: N/A;  . EYE SURGERY      Social History Social History   Tobacco Use  . Smoking status: Former Research scientist (life sciences)  . Smokeless tobacco: Never Used  Substance Use Topics  . Alcohol use: No    Frequency: Never  . Drug use: No     Family History Family History  Problem Relation Age of Onset  . Diabetes Mother   . Cancer Sister   . Throat cancer Brother   . Cirrhosis Brother   . COPD Sister   . COPD Sister   . Diabetes Sister      No Known  Allergies   REVIEW OF SYSTEMS (Negative unless checked)  Constitutional: [] Weight loss  [] Fever  [] Chills Cardiac: [] Chest pain   [] Chest pressure   [] Palpitations   [] Shortness of breath when laying flat   [] Shortness of breath at rest   [x] Shortness of breath with exertion. Vascular:  [] Pain in legs with walking   [] Pain in legs at rest   [] Pain in legs when laying flat   [] Claudication   [] Pain in feet when walking  [] Pain in feet at rest  [] Pain in feet when laying flat   [] History of DVT   [] Phlebitis   [] Swelling in legs   [] Varicose veins   [] Non-healing ulcers Pulmonary:   [] Uses home oxygen   [] Productive cough   [] Hemoptysis   [] Wheeze  [x] COPD   [] Asthma Neurologic:  [] Dizziness  [] Blackouts   [] Seizures   [] History of stroke   [] History of TIA  [] Aphasia   [] Temporary blindness   [] Dysphagia   [] Weakness or numbness in arms   [] Weakness or numbness in legs Musculoskeletal:  [x] Arthritis   [] Joint swelling   [x] Joint pain   [] Low back pain Hematologic:  [] Easy bruising  [] Easy bleeding   [] Hypercoagulable state   [] Anemic  [] Hepatitis Gastrointestinal:  [] Blood in stool   [] Vomiting blood  [] Gastroesophageal reflux/heartburn   [] Difficulty swallowing. Genitourinary:  [x] Chronic kidney disease   [] Difficult urination  [] Frequent urination  [] Burning with urination   [] Blood in urine Skin:  [] Rashes   [] Ulcers   [] Wounds Psychological:  [] History of anxiety   []  History of major depression.  Physical Examination  Vitals:   01/09/18 1004  BP: (!) 137/53  Pulse: (!) 47  Resp: 15  Weight: 155 lb (70.3 kg)  Height: 5\' 7"  (1.702 m)   Body mass index is 24.28 kg/m. Gen:  WD/WN, NAD Head: [place]/AT, No temporalis wasting. Ear/Nose/Throat: Hearing grossly intact, nares w/o erythema or drainage, trachea midline Eyes: Conjunctiva clear. Sclera non-icteric Neck: Supple.  Right carotid bruit  Pulmonary:  Good air movement, equal and clear to auscultation bilaterally.  Cardiac: RRR, No  JVD Vascular:  Vessel Right Left  Radial Palpable Palpable                                   Gastrointestinal: soft, non-tender/non-distended.  Musculoskeletal: M/S 5/5 throughout.  No deformity or atrophy.  Mild bilateral lower extremity edema. Neurologic: CN 2-12 intact. Sensation grossly intact in extremities.  Symmetrical.  Speech is fluent. Motor exam as listed above. Psychiatric: Judgment intact, Mood & affect appropriate for pt's clinical situation. Dermatologic: No rashes or  ulcers noted.  No cellulitis or open wounds.      CBC Lab Results  Component Value Date   WBC 16.0 (H) 05/20/2017   HGB 13.8 05/20/2017   HCT 41.6 05/20/2017   MCV 91.0 05/20/2017   PLT 429 05/20/2017    BMET    Component Value Date/Time   NA 135 05/20/2017 1524   K 3.7 05/20/2017 1524   CL 100 (L) 05/20/2017 1524   CO2 25 05/20/2017 1524   GLUCOSE 107 (H) 05/20/2017 1524   BUN 35 (H) 05/20/2017 1524   CREATININE 1.44 (H) 05/20/2017 1524   CREATININE 1.29 07/05/2013 1004   CALCIUM 9.0 05/20/2017 1524   CALCIUM 9.2 07/05/2013 1004   GFRNONAA 45 (L) 05/20/2017 1524   GFRNONAA 55 (L) 07/05/2013 1004   GFRAA 53 (L) 05/20/2017 1524   GFRAA >60 07/05/2013 1004   CrCl cannot be calculated (Patient's most recent lab result is older than the maximum 21 days allowed.).  COAG Lab Results  Component Value Date   INR 0.98 05/20/2017    Radiology No results found.    Assessment/Plan Essential hypertension blood pressure control important in reducing the progression of atherosclerotic disease. On appropriate oral medications.   Atherosclerotic heart disease of native coronary artery without angina pectoris Sounds like he has done well from a cardiac standpoint.  We will ensure that his cardiologist feels he is an acceptable risk for carotid endarterectomy.  Bilateral carotid artery stenosis The patient has had slow progression of his right carotid artery stenosis and now appears  to be in the 75% or greater range by CT angiogram.  I have independently reviewed his CT angiogram.  The patient has mild to moderate left carotid artery stenosis but this is nowhere near a surgical range as of yet.  I discussed the risks and benefits of carotid endarterectomy.  I have discussed the reason and rationale for carotid endarterectomy primarily being stroke risk reduction long-term.  The patient voices his understanding and desires to proceed with right carotid endarterectomy.    Leotis Pain, MD  01/09/2018 11:30 AM    This note was created with Dragon medical transcription system.  Any errors from dictation are purely unintentional

## 2018-01-09 NOTE — Assessment & Plan Note (Signed)
The patient has had slow progression of his right carotid artery stenosis and now appears to be in the 75% or greater range by CT angiogram.  I have independently reviewed his CT angiogram.  The patient has mild to moderate left carotid artery stenosis but this is nowhere near a surgical range as of yet.  I discussed the risks and benefits of carotid endarterectomy.  I have discussed the reason and rationale for carotid endarterectomy primarily being stroke risk reduction long-term.  The patient voices his understanding and desires to proceed with right carotid endarterectomy.

## 2018-01-09 NOTE — Assessment & Plan Note (Signed)
blood pressure control important in reducing the progression of atherosclerotic disease. On appropriate oral medications.  

## 2018-01-09 NOTE — Patient Instructions (Signed)
Carotid Endarterectomy A carotid endarterectomy is a surgery to remove a blockage in the carotid arteries. The carotid arteries are the large blood vessels on both sides of the neck that supply blood to the brain. Carotid artery disease, also called carotid artery stenosis, is the narrowing or blockage of one or both carotid arteries. Carotid artery disease is usually caused by atherosclerosis, which is a buildup of fat and plaques in the arteries. Some buildup of plaques normally occurs with aging. The plaques may partially or totally block blood flow or cause a clot to form in the carotid arteries. This may cause a stroke. Tell a health care provider about:  Any allergies you have.  All medicines you are taking, including vitamins, herbs, eye drops, creams, and over-the-counter medicines.  Any problems you or family members have had with anesthetic medicines.  Any blood disorders you have.  Any surgeries you have had.  Any medical conditions you have, including diabetes, kidney problems, and infections.  Whether you are pregnant or may be pregnant. What are the risks? Generally, this is a safe procedure. However, problems may occur, including:  Infection.  Bleeding.  Blood clots.  Allergic reactions to medicines.  Damage to nerves near the carotid arteries. This can cause a hoarse voice or weakness of muscles your face.  Stroke.  Seizures.  Heart attack (myocardial infarction).  Narrowing of the opened blood vessel (re-stenosis) . This may require another surgery.  What happens before the procedure? Medicines  Ask your health care provider about: ? Changing or stopping your regular medicines. This is especially important if you are taking diabetes medicines or blood thinners. ? Taking medicines such as aspirin and ibuprofen. These medicines can thin your blood. Do not take these medicines before your procedure if your health care provider instructs you not to.  You may  be given antibiotic medicine to help prevent infection. Staying hydrated Follow instructions from your health care provider about hydration, which may include:  Up to 2 hours before the procedure - you may continue to drink clear liquids, such as water, clear fruit juice, black coffee, and plain tea.  Eating and drinking restrictions Follow instructions from your health care provider about eating and drinking, which may include:  8 hours before the procedure - stop eating heavy meals or foods such as meat, fried foods, or fatty foods.  6 hours before the procedure - stop eating light meals or foods, such as toast or cereal.  6 hours before the procedure - stop drinking milk or drinks that contain milk.  2 hours before the procedure - stop drinking clear liquids.  General instructions  Do not use any products that contain nicotine or tobacco, such as cigarettes and e-cigarettes. These can delay healing after surgery. If you need help quitting, ask your health care provider.  You may need to have blood tests, a test to check heart rhythm (electrocardiogram), or a test to check blood flow (angiogram).  Plan to have someone take you home from the hospital or clinic.  Ask your health care provider how your surgical site will be marked or identified.  You may be asked to shower with a germ-killing soap. What happens during the procedure?  To lower your risk of infection: ? Your health care team will wash or sanitize their hands. ? Your skin will be washed with soap. ? Hair may be removed from the surgical area.  An IV tube will be inserted into one of your veins.  You   will be given one or more of the following: ? A medicine to help you relax (sedative). ? A medicine to make you fall asleep (general anesthetic).  The surgeon will make a small incision in your neck to expose the carotid artery.  A tube may be inserted into the carotid artery above and below the blockage. This tube  will temporarily allow blood to flow around the blockage during the surgery.  An incision will be made in the carotid artery at the location of the blockage, then the blockage will be removed. In some cases, a section of the carotid artery may be removed and a graft patch may be used to repair the artery.  The carotid artery will be closed with stitches (sutures).  If a tube was inserted into the artery to allow blood flow around the blockage during surgery, the tube will be removed. With the tube removed, blood flow to the brain will be restored through the carotid artery.  The incision in the neck will be closed with sutures.  A bandage (dressing) will be placed over your incision. The procedure may vary among health care providers and hospitals. What happens after the procedure?  Your blood pressure, heart rate, breathing rate, and blood oxygen level will be monitored until the medicines you were given have worn off.  You may have some pain or an ache in your neck for a couple weeks. This is normal.  Do not drive for 24 hours if you were given a sedative. Summary  A carotid endarterectomy is a surgery to remove a blockage in the carotid arteries.  The carotid arteries are the large blood vessels on both sides of the neck that supply blood to the brain.  Before the procedure, ask your health care provider about changing or stopping your regular medicines.  Follow instructions from your health care provider about eating and drinking before the procedure.  After the procedure, do not drive for 24 hours if you were given a sedative. This information is not intended to replace advice given to you by your health care provider. Make sure you discuss any questions you have with your health care provider. Document Released: 10/21/2012 Document Revised: 01/08/2016 Document Reviewed: 01/08/2016 Elsevier Interactive Patient Education  2017 [employer]  

## 2018-01-09 NOTE — Assessment & Plan Note (Signed)
Sounds like he has done well from a cardiac standpoint.  We will ensure that his cardiologist feels he is an acceptable risk for carotid endarterectomy.

## 2018-01-20 DIAGNOSIS — R7309 Other abnormal glucose: Secondary | ICD-10-CM | POA: Diagnosis not present

## 2018-01-20 DIAGNOSIS — E782 Mixed hyperlipidemia: Secondary | ICD-10-CM | POA: Diagnosis not present

## 2018-01-20 DIAGNOSIS — E039 Hypothyroidism, unspecified: Secondary | ICD-10-CM | POA: Diagnosis not present

## 2018-01-20 DIAGNOSIS — Z79899 Other long term (current) drug therapy: Secondary | ICD-10-CM | POA: Diagnosis not present

## 2018-01-20 DIAGNOSIS — I739 Peripheral vascular disease, unspecified: Secondary | ICD-10-CM | POA: Diagnosis not present

## 2018-01-20 DIAGNOSIS — I10 Essential (primary) hypertension: Secondary | ICD-10-CM | POA: Diagnosis not present

## 2018-01-26 DIAGNOSIS — I2510 Atherosclerotic heart disease of native coronary artery without angina pectoris: Secondary | ICD-10-CM | POA: Diagnosis not present

## 2018-01-26 DIAGNOSIS — I7389 Other specified peripheral vascular diseases: Secondary | ICD-10-CM | POA: Diagnosis not present

## 2018-01-26 DIAGNOSIS — Z79899 Other long term (current) drug therapy: Secondary | ICD-10-CM | POA: Diagnosis not present

## 2018-01-26 DIAGNOSIS — I10 Essential (primary) hypertension: Secondary | ICD-10-CM | POA: Diagnosis not present

## 2018-01-26 DIAGNOSIS — R7309 Other abnormal glucose: Secondary | ICD-10-CM | POA: Diagnosis not present

## 2018-01-26 DIAGNOSIS — E782 Mixed hyperlipidemia: Secondary | ICD-10-CM | POA: Diagnosis not present

## 2018-01-27 ENCOUNTER — Encounter (INDEPENDENT_AMBULATORY_CARE_PROVIDER_SITE_OTHER): Payer: Self-pay

## 2018-02-03 ENCOUNTER — Ambulatory Visit (INDEPENDENT_AMBULATORY_CARE_PROVIDER_SITE_OTHER): Payer: PPO | Admitting: Vascular Surgery

## 2018-02-03 ENCOUNTER — Encounter (INDEPENDENT_AMBULATORY_CARE_PROVIDER_SITE_OTHER): Payer: PPO

## 2018-02-05 ENCOUNTER — Other Ambulatory Visit (INDEPENDENT_AMBULATORY_CARE_PROVIDER_SITE_OTHER): Payer: Self-pay | Admitting: Nurse Practitioner

## 2018-02-06 ENCOUNTER — Other Ambulatory Visit: Payer: Self-pay

## 2018-02-06 ENCOUNTER — Encounter
Admission: RE | Admit: 2018-02-06 | Discharge: 2018-02-06 | Disposition: A | Discharge status: Home / Self Care | Payer: PPO | Source: Ambulatory Visit | Attending: Vascular Surgery | Admitting: Vascular Surgery

## 2018-02-06 DIAGNOSIS — R001 Bradycardia, unspecified: Secondary | ICD-10-CM | POA: Diagnosis not present

## 2018-02-06 DIAGNOSIS — Z01810 Encounter for preprocedural cardiovascular examination: Secondary | ICD-10-CM | POA: Diagnosis not present

## 2018-02-06 DIAGNOSIS — I2510 Atherosclerotic heart disease of native coronary artery without angina pectoris: Secondary | ICD-10-CM | POA: Diagnosis not present

## 2018-02-06 DIAGNOSIS — Z01818 Encounter for other preprocedural examination: Secondary | ICD-10-CM | POA: Diagnosis not present

## 2018-02-06 DIAGNOSIS — D649 Anemia, unspecified: Secondary | ICD-10-CM | POA: Diagnosis not present

## 2018-02-06 HISTORY — DX: Hypothyroidism, unspecified: E03.9

## 2018-02-06 HISTORY — DX: Chronic obstructive pulmonary disease, unspecified: J44.9

## 2018-02-06 LAB — PROTIME-INR
INR: 1.03
Prothrombin Time: 13.4 seconds (ref 11.4–15.2)

## 2018-02-06 LAB — APTT: aPTT: 32 seconds (ref 24–36)

## 2018-02-06 LAB — SURGICAL PCR SCREEN
MRSA, PCR: NEGATIVE
Staphylococcus aureus: NEGATIVE

## 2018-02-06 LAB — TYPE AND SCREEN
ABO/RH(D): B POS
Antibody Screen: NEGATIVE

## 2018-02-06 NOTE — Pre-Procedure Instructions (Signed)
Dr. Lucky Cowboy said we could proceed with the surgery even if patient took his Plavix today.  Instructed to not take anymore until after the surgery.

## 2018-02-06 NOTE — Patient Instructions (Addendum)
Your procedure is scheduled on: Thurs. 12/12 Report to Day Surgery. To find out your arrival time please call [phone] between [facility] [place] on Wed. 12/11.  Remember: Instructions that are not followed completely may result in serious medical risk,  up to and including death, or upon the discretion of your surgeon and anesthesiologist your  surgery may need to be rescheduled.     _X__ 1. Do not eat food after midnight the night before your procedure.                 No gum chewing or hard candies. You may drink clear liquids up to 2 hours                 before you are scheduled to arrive for your surgery- DO not drink clear                 liquids within 2 hours of the start of your surgery.                 Clear Liquids include:  water, apple juice without pulp, clear carbohydrate                 drink such as Clearfast of Gatorade, Black Coffee or Tea (Do not add                 anything to coffee or tea).  __X__2.  On the morning of surgery brush your teeth with toothpaste and water, you                may rinse your mouth with mouthwash if you wish.  Do not swallow any toothpaste of mouthwash.     ___ 3.  No Alcohol for 24 hours before or after surgery.   ___ 4.  Do Not Smoke or use e-cigarettes For 24 Hours Prior to Your Surgery.                 Do not use any chewable tobacco products for at least 6 hours prior to                 surgery.  ____  5.  Bring all medications with you on the day of surgery if instructed.   __x__  6.  Notify your doctor if there is any change in your medical condition      (cold, fever, infections).     Do not wear jewelry, make-up, hairpins, clips or nail polish. Do not wear lotions, powders, or perfumes. You may wear deodorant. Do not shave 48 hours prior to surgery. Men may shave face and neck. Do not bring valuables to the hospital.    [facility] is not responsible for any belongings or valuables.  Contacts,  dentures or bridgework may not be worn into surgery. Leave your suitcase in the car. After surgery it may be brought to your room. For patients admitted to the hospital, discharge time is determined by your treatment team.   Patients discharged the day of surgery will not be allowed to drive home.   Please read over the following fact sheets that you were given:   ___x_ Take these medicines the morning of surgery with A SIP OF WATER:    1. BYSTOLIC 5 MG tablet  2. dorzolamide-timolol (COSOPT) 22.3-6.8 MG/ML ophthalmic solution  3. guaiFENesin (MUCINEX) 600 MG 12 hr tablet  4.levothyroxine (SYNTHROID, LEVOTHROID) 50 MCG tablet  5.ranolazine (RANEXA) 1000 MG SR tablet  6.  ____ Fleet Enema (as directed)   __x__ Use CHG Soap as directed  _x___ Use inhalers on the day of surgery Albuterol Sulfate (PROAIR RESPICLICK) 295 (90 Base) MCG/ACT AEPB and bring with you  ____ Stop metformin 2 days prior to surgery    ____ Take 1/2 of usual insulin dose the night before surgery. No insulin the morning          of surgery.   __x__ continue aspirin Stop Plavix today  ____ Stop Anti-inflammatories on    ____ Stop supplements until after surgery.    ____ Bring C-Pap to the hospital.

## 2018-02-12 ENCOUNTER — Encounter: Payer: Self-pay | Admitting: *Deleted

## 2018-02-12 ENCOUNTER — Inpatient Hospital Stay
Admission: RE | Admit: 2018-02-12 | Discharge: 2018-02-13 | DRG: 039 | Disposition: A | Discharge status: Home / Self Care | Payer: PPO | Source: Ambulatory Visit | Attending: Vascular Surgery | Admitting: Vascular Surgery

## 2018-02-12 ENCOUNTER — Inpatient Hospital Stay: Payer: PPO | Admitting: Anesthesiology

## 2018-02-12 ENCOUNTER — Other Ambulatory Visit: Payer: Self-pay

## 2018-02-12 ENCOUNTER — Encounter
Admission: RE | Disposition: A | Discharge status: Home / Self Care | Payer: Self-pay | Source: Ambulatory Visit | Attending: Vascular Surgery

## 2018-02-12 DIAGNOSIS — N189 Chronic kidney disease, unspecified: Secondary | ICD-10-CM | POA: Diagnosis present

## 2018-02-12 DIAGNOSIS — I6523 Occlusion and stenosis of bilateral carotid arteries: Principal | ICD-10-CM | POA: Diagnosis present

## 2018-02-12 DIAGNOSIS — Z833 Family history of diabetes mellitus: Secondary | ICD-10-CM | POA: Diagnosis not present

## 2018-02-12 DIAGNOSIS — I2510 Atherosclerotic heart disease of native coronary artery without angina pectoris: Secondary | ICD-10-CM | POA: Diagnosis present

## 2018-02-12 DIAGNOSIS — I129 Hypertensive chronic kidney disease with stage 1 through stage 4 chronic kidney disease, or unspecified chronic kidney disease: Secondary | ICD-10-CM | POA: Diagnosis present

## 2018-02-12 DIAGNOSIS — Z9841 Cataract extraction status, right eye: Secondary | ICD-10-CM

## 2018-02-12 DIAGNOSIS — J449 Chronic obstructive pulmonary disease, unspecified: Secondary | ICD-10-CM | POA: Diagnosis present

## 2018-02-12 DIAGNOSIS — Z951 Presence of aortocoronary bypass graft: Secondary | ICD-10-CM | POA: Diagnosis not present

## 2018-02-12 DIAGNOSIS — Z9842 Cataract extraction status, left eye: Secondary | ICD-10-CM

## 2018-02-12 DIAGNOSIS — E785 Hyperlipidemia, unspecified: Secondary | ICD-10-CM | POA: Diagnosis not present

## 2018-02-12 DIAGNOSIS — I6521 Occlusion and stenosis of right carotid artery: Secondary | ICD-10-CM | POA: Diagnosis present

## 2018-02-12 DIAGNOSIS — Z825 Family history of asthma and other chronic lower respiratory diseases: Secondary | ICD-10-CM | POA: Diagnosis not present

## 2018-02-12 DIAGNOSIS — Z808 Family history of malignant neoplasm of other organs or systems: Secondary | ICD-10-CM

## 2018-02-12 DIAGNOSIS — H409 Unspecified glaucoma: Secondary | ICD-10-CM | POA: Diagnosis present

## 2018-02-12 DIAGNOSIS — Z8619 Personal history of other infectious and parasitic diseases: Secondary | ICD-10-CM | POA: Diagnosis not present

## 2018-02-12 DIAGNOSIS — Z87891 Personal history of nicotine dependence: Secondary | ICD-10-CM | POA: Diagnosis not present

## 2018-02-12 DIAGNOSIS — E039 Hypothyroidism, unspecified: Secondary | ICD-10-CM | POA: Diagnosis present

## 2018-02-12 DIAGNOSIS — Z79890 Hormone replacement therapy (postmenopausal): Secondary | ICD-10-CM

## 2018-02-12 HISTORY — PX: ENDARTERECTOMY: SHX5162

## 2018-02-12 LAB — GLUCOSE, CAPILLARY: Glucose-Capillary: 93 mg/dL (ref 70–99)

## 2018-02-12 LAB — ABO/RH: ABO/RH(D): B POS

## 2018-02-12 SURGERY — ENDARTERECTOMY, CAROTID
Anesthesia: General | Laterality: Right

## 2018-02-12 MED ORDER — EPHEDRINE SULFATE 50 MG/ML IJ SOLN
INTRAMUSCULAR | Status: DC | PRN
  Administered 2018-02-12 (×3): 10 mg via INTRAVENOUS

## 2018-02-12 MED ORDER — MIDAZOLAM HCL 2 MG/2ML IJ SOLN
INTRAMUSCULAR | Status: DC | PRN
  Administered 2018-02-12: 1 mg via INTRAVENOUS

## 2018-02-12 MED ORDER — EVICEL 2 ML EX KIT
PACK | CUTANEOUS | Status: AC
  Filled 2018-02-12: qty 1

## 2018-02-12 MED ORDER — LISINOPRIL 20 MG PO TABS
10.0000 mg | ORAL_TABLET | Freq: Every day | ORAL | Status: DC
  Administered 2018-02-12 – 2018-02-13 (×2): 10 mg via ORAL
  Filled 2018-02-12: qty 1
  Filled 2018-02-12: qty 2
  Filled 2018-02-12 (×2): qty 1

## 2018-02-12 MED ORDER — CEFAZOLIN SODIUM-DEXTROSE 2-4 GM/100ML-% IV SOLN
2.0000 g | Freq: Three times a day (TID) | INTRAVENOUS | Status: AC
  Administered 2018-02-12 – 2018-02-13 (×2): 2 g via INTRAVENOUS
  Filled 2018-02-12 (×2): qty 100

## 2018-02-12 MED ORDER — OXYCODONE-ACETAMINOPHEN 5-325 MG PO TABS: 1.0000 | ORAL_TABLET | ORAL | Status: DC | PRN

## 2018-02-12 MED ORDER — FAMOTIDINE IN NACL 20-0.9 MG/50ML-% IV SOLN
20.0000 mg | Freq: Two times a day (BID) | INTRAVENOUS | Status: DC
  Administered 2018-02-12 – 2018-02-13 (×2): 20 mg via INTRAVENOUS
  Filled 2018-02-12 (×2): qty 50

## 2018-02-12 MED ORDER — LEVOTHYROXINE SODIUM 50 MCG PO TABS
50.0000 ug | ORAL_TABLET | Freq: Every day | ORAL | Status: DC
  Administered 2018-02-13: 50 ug via ORAL
  Filled 2018-02-12: qty 1

## 2018-02-12 MED ORDER — PROPOFOL 10 MG/ML IV BOLUS
INTRAVENOUS | Status: AC
  Filled 2018-02-12: qty 20

## 2018-02-12 MED ORDER — ATORVASTATIN CALCIUM 20 MG PO TABS
80.0000 mg | ORAL_TABLET | Freq: Every day | ORAL | Status: DC
  Administered 2018-02-12 – 2018-02-13 (×2): 80 mg via ORAL
  Filled 2018-02-12 (×2): qty 4

## 2018-02-12 MED ORDER — NITROGLYCERIN IN D5W 200-5 MCG/ML-% IV SOLN
INTRAVENOUS | Status: AC
  Filled 2018-02-12: qty 250

## 2018-02-12 MED ORDER — SUGAMMADEX SODIUM 200 MG/2ML IV SOLN
INTRAVENOUS | Status: AC
  Filled 2018-02-12: qty 2

## 2018-02-12 MED ORDER — NITROGLYCERIN IN D5W 200-5 MCG/ML-% IV SOLN: 5.0000 ug/min | INTRAVENOUS | Status: DC

## 2018-02-12 MED ORDER — LIDOCAINE HCL 1 % IJ SOLN
INTRAMUSCULAR | Status: DC | PRN
  Administered 2018-02-12: 10 mL

## 2018-02-12 MED ORDER — SUGAMMADEX SODIUM 200 MG/2ML IV SOLN
INTRAVENOUS | Status: DC | PRN
  Administered 2018-02-12: 40 mg via INTRAVENOUS
  Administered 2018-02-12: 100 mg via INTRAVENOUS

## 2018-02-12 MED ORDER — MORPHINE SULFATE (PF) 2 MG/ML IV SOLN
2.0000 mg | INTRAVENOUS | Status: DC | PRN
  Administered 2018-02-12: 2 mg via INTRAVENOUS
  Administered 2018-02-12 (×2): 5 mg via INTRAVENOUS
  Filled 2018-02-12 (×2): qty 3
  Filled 2018-02-12: qty 1

## 2018-02-12 MED ORDER — ACETAMINOPHEN 650 MG RE SUPP: 325.0000 mg | RECTAL | Status: DC | PRN

## 2018-02-12 MED ORDER — METOPROLOL TARTRATE 5 MG/5ML IV SOLN: 2.0000 mg | INTRAVENOUS | Status: DC | PRN

## 2018-02-12 MED ORDER — CLOPIDOGREL BISULFATE 75 MG PO TABS
75.0000 mg | ORAL_TABLET | Freq: Every day | ORAL | Status: DC
  Administered 2018-02-13: 75 mg via ORAL
  Filled 2018-02-12: qty 1

## 2018-02-12 MED ORDER — CEFAZOLIN SODIUM-DEXTROSE 2-4 GM/100ML-% IV SOLN
2.0000 g | INTRAVENOUS | Status: AC
  Administered 2018-02-12: 2 g via INTRAVENOUS

## 2018-02-12 MED ORDER — ASPIRIN EC 81 MG PO TBEC
81.0000 mg | DELAYED_RELEASE_TABLET | Freq: Every day | ORAL | Status: DC
  Administered 2018-02-12 – 2018-02-13 (×2): 81 mg via ORAL
  Filled 2018-02-12 (×2): qty 1

## 2018-02-12 MED ORDER — DORZOLAMIDE HCL-TIMOLOL MAL 2-0.5 % OP SOLN
1.0000 [drp] | Freq: Two times a day (BID) | OPHTHALMIC | Status: DC
  Administered 2018-02-12 – 2018-02-13 (×2): 1 [drp] via OPHTHALMIC
  Filled 2018-02-12: qty 10

## 2018-02-12 MED ORDER — LATANOPROST 0.005 % OP SOLN
1.0000 [drp] | Freq: Every day | OPHTHALMIC | Status: DC
  Administered 2018-02-12: 1 [drp] via OPHTHALMIC
  Filled 2018-02-12: qty 2.5

## 2018-02-12 MED ORDER — SPACER/AERO CHAMBER MOUTHPIECE MISC: 1.0000 [IU] | Status: DC | PRN

## 2018-02-12 MED ORDER — ONDANSETRON HCL 4 MG/2ML IJ SOLN: 4.0000 mg | Freq: Four times a day (QID) | INTRAMUSCULAR | Status: DC | PRN

## 2018-02-12 MED ORDER — ALUM & MAG HYDROXIDE-SIMETH 200-200-20 MG/5ML PO SUSP
15.0000 mL | ORAL | Status: DC | PRN
  Filled 2018-02-12: qty 30

## 2018-02-12 MED ORDER — VITAMIN B-12 1000 MCG PO TABS
1000.0000 ug | ORAL_TABLET | Freq: Every day | ORAL | Status: DC
  Administered 2018-02-13: 1000 ug via ORAL
  Filled 2018-02-12: qty 1

## 2018-02-12 MED ORDER — ROCURONIUM BROMIDE 100 MG/10ML IV SOLN
INTRAVENOUS | Status: DC | PRN
  Administered 2018-02-12: 20 mg via INTRAVENOUS
  Administered 2018-02-12: 50 mg via INTRAVENOUS

## 2018-02-12 MED ORDER — DEXAMETHASONE SODIUM PHOSPHATE 10 MG/ML IJ SOLN
INTRAMUSCULAR | Status: AC
  Filled 2018-02-12: qty 1

## 2018-02-12 MED ORDER — SODIUM CHLORIDE 0.9 % IV SOLN
INTRAVENOUS | Status: DC
  Administered 2018-02-12: 17:00:00 via INTRAVENOUS

## 2018-02-12 MED ORDER — HEPARIN SODIUM (PORCINE) 10000 UNIT/ML IJ SOLN
INTRAMUSCULAR | Status: AC
  Filled 2018-02-12: qty 1

## 2018-02-12 MED ORDER — ACETAMINOPHEN 325 MG PO TABS
325.0000 mg | ORAL_TABLET | ORAL | Status: DC | PRN
  Administered 2018-02-13: 325 mg via ORAL
  Filled 2018-02-12: qty 2

## 2018-02-12 MED ORDER — LABETALOL HCL 5 MG/ML IV SOLN: 10.0000 mg | INTRAVENOUS | Status: DC | PRN

## 2018-02-12 MED ORDER — POTASSIUM CHLORIDE CRYS ER 20 MEQ PO TBCR: 20.0000 meq | EXTENDED_RELEASE_TABLET | Freq: Every day | ORAL | Status: DC | PRN

## 2018-02-12 MED ORDER — RANOLAZINE ER 500 MG PO TB12
500.0000 mg | ORAL_TABLET | Freq: Two times a day (BID) | ORAL | Status: DC
  Administered 2018-02-12 – 2018-02-13 (×2): 500 mg via ORAL
  Filled 2018-02-12 (×3): qty 1

## 2018-02-12 MED ORDER — ESMOLOL HCL 100 MG/10ML IV SOLN
INTRAVENOUS | Status: AC
  Filled 2018-02-12: qty 10

## 2018-02-12 MED ORDER — FENTANYL CITRATE (PF) 100 MCG/2ML IJ SOLN: 25.0000 ug | INTRAMUSCULAR | Status: DC | PRN

## 2018-02-12 MED ORDER — HEPARIN SODIUM (PORCINE) 1000 UNIT/ML IJ SOLN
INTRAMUSCULAR | Status: DC | PRN
  Administered 2018-02-12: 6000 [IU] via INTRAVENOUS

## 2018-02-12 MED ORDER — ONDANSETRON HCL 4 MG/2ML IJ SOLN
INTRAMUSCULAR | Status: DC | PRN
  Administered 2018-02-12: 4 mg via INTRAVENOUS

## 2018-02-12 MED ORDER — LACTATED RINGERS IV SOLN
INTRAVENOUS | Status: DC
  Administered 2018-02-12: 14:00:00 via INTRAVENOUS

## 2018-02-12 MED ORDER — MIDAZOLAM HCL 2 MG/2ML IJ SOLN
INTRAMUSCULAR | Status: AC
  Filled 2018-02-12: qty 2

## 2018-02-12 MED ORDER — PHENYLEPHRINE HCL 10 MG/ML IJ SOLN
INTRAMUSCULAR | Status: DC | PRN
  Administered 2018-02-12 (×3): 100 ug via INTRAVENOUS

## 2018-02-12 MED ORDER — GUAIFENESIN-DM 100-10 MG/5ML PO SYRP
15.0000 mL | ORAL_SOLUTION | ORAL | Status: DC | PRN
  Filled 2018-02-12: qty 15

## 2018-02-12 MED ORDER — FENTANYL CITRATE (PF) 100 MCG/2ML IJ SOLN
INTRAMUSCULAR | Status: AC
  Filled 2018-02-12: qty 2

## 2018-02-12 MED ORDER — MAGNESIUM SULFATE 2 GM/50ML IV SOLN: 2.0000 g | Freq: Every day | INTRAVENOUS | Status: DC | PRN

## 2018-02-12 MED ORDER — CEFAZOLIN SODIUM-DEXTROSE 2-4 GM/100ML-% IV SOLN
INTRAVENOUS | Status: AC
  Filled 2018-02-12: qty 100

## 2018-02-12 MED ORDER — PHENOL 1.4 % MT LIQD
1.0000 | OROMUCOSAL | Status: DC | PRN
  Filled 2018-02-12: qty 177

## 2018-02-12 MED ORDER — SODIUM CHLORIDE 0.9 % IV SOLN: 500.0000 mL | Freq: Once | INTRAVENOUS | Status: DC | PRN

## 2018-02-12 MED ORDER — HYDROCHLOROTHIAZIDE 12.5 MG PO CAPS
12.5000 mg | ORAL_CAPSULE | Freq: Every evening | ORAL | Status: DC
  Administered 2018-02-12: 12.5 mg via ORAL
  Filled 2018-02-12 (×2): qty 1

## 2018-02-12 MED ORDER — DOCUSATE SODIUM 100 MG PO CAPS
100.0000 mg | ORAL_CAPSULE | Freq: Every day | ORAL | Status: DC
  Administered 2018-02-13: 100 mg via ORAL
  Filled 2018-02-12: qty 1

## 2018-02-12 MED ORDER — FENTANYL CITRATE (PF) 100 MCG/2ML IJ SOLN
INTRAMUSCULAR | Status: DC | PRN
  Administered 2018-02-12 (×2): 50 ug via INTRAVENOUS

## 2018-02-12 MED ORDER — DEXAMETHASONE SODIUM PHOSPHATE 10 MG/ML IJ SOLN
INTRAMUSCULAR | Status: DC | PRN
  Administered 2018-02-12: 10 mg via INTRAVENOUS

## 2018-02-12 MED ORDER — PROPOFOL 10 MG/ML IV BOLUS
INTRAVENOUS | Status: DC | PRN
  Administered 2018-02-12: 120 mg via INTRAVENOUS

## 2018-02-12 MED ORDER — ESMOLOL HCL-SODIUM CHLORIDE 2000 MG/100ML IV SOLN
25.0000 ug/kg/min | INTRAVENOUS | Status: DC
  Filled 2018-02-12: qty 100

## 2018-02-12 MED ORDER — GUAIFENESIN ER 600 MG PO TB12
1200.0000 mg | ORAL_TABLET | Freq: Two times a day (BID) | ORAL | Status: DC
  Administered 2018-02-12 – 2018-02-13 (×2): 1200 mg via ORAL
  Filled 2018-02-12 (×2): qty 2

## 2018-02-12 MED ORDER — ALBUTEROL SULFATE (2.5 MG/3ML) 0.083% IN NEBU: 3.0000 mL | INHALATION_SOLUTION | RESPIRATORY_TRACT | Status: DC | PRN

## 2018-02-12 MED ORDER — ROCURONIUM BROMIDE 50 MG/5ML IV SOLN
INTRAVENOUS | Status: AC
  Filled 2018-02-12: qty 1

## 2018-02-12 MED ORDER — HYDRALAZINE HCL 20 MG/ML IJ SOLN
5.0000 mg | INTRAMUSCULAR | Status: DC | PRN
  Administered 2018-02-12: 5 mg via INTRAVENOUS
  Filled 2018-02-12: qty 1

## 2018-02-12 MED ORDER — ONDANSETRON HCL 4 MG/2ML IJ SOLN: 4.0000 mg | Freq: Once | INTRAMUSCULAR | Status: DC | PRN

## 2018-02-12 SURGICAL SUPPLY — 62 items
BAG DECANTER FOR FLEXI CONT (MISCELLANEOUS) ×3 IMPLANT
BLADE SURG 15 STRL LF DISP TIS (BLADE) ×1 IMPLANT
BLADE SURG 15 STRL SS (BLADE) ×2
BLADE SURG SZ11 CARB STEEL (BLADE) ×3 IMPLANT
BOOT SUTURE AID YELLOW STND (SUTURE) ×3 IMPLANT
BRUSH SCRUB EZ  4% CHG (MISCELLANEOUS) ×2
BRUSH SCRUB EZ 4% CHG (MISCELLANEOUS) ×1 IMPLANT
CANISTER SUCT 1200ML W/VALVE (MISCELLANEOUS) ×3 IMPLANT
COVER WAND RF STERILE (DRAPES) ×3 IMPLANT
DERMABOND ADVANCED (GAUZE/BANDAGES/DRESSINGS) ×2
DERMABOND ADVANCED .7 DNX12 (GAUZE/BANDAGES/DRESSINGS) ×1 IMPLANT
DRAPE INCISE IOBAN 66X45 STRL (DRAPES) ×3 IMPLANT
DRAPE LAPAROTOMY 77X122 PED (DRAPES) ×3 IMPLANT
DRAPE SHEET LG 3/4 BI-LAMINATE (DRAPES) ×3 IMPLANT
DRSG TEGADERM 4X4.75 (GAUZE/BANDAGES/DRESSINGS) IMPLANT
DRSG TELFA 3X8 NADH (GAUZE/BANDAGES/DRESSINGS) IMPLANT
DURAPREP 26ML APPLICATOR (WOUND CARE) ×3 IMPLANT
ELECT CAUTERY BLADE 6.4 (BLADE) ×3 IMPLANT
ELECT REM PT RETURN 9FT ADLT (ELECTROSURGICAL) ×3
ELECTRODE REM PT RTRN 9FT ADLT (ELECTROSURGICAL) ×1 IMPLANT
GLOVE BIO SURGEON STRL SZ7 (GLOVE) ×9 IMPLANT
GLOVE INDICATOR 7.5 STRL GRN (GLOVE) ×3 IMPLANT
GOWN STRL REUS W/ TWL LRG LVL3 (GOWN DISPOSABLE) ×2 IMPLANT
GOWN STRL REUS W/ TWL XL LVL3 (GOWN DISPOSABLE) ×2 IMPLANT
GOWN STRL REUS W/TWL LRG LVL3 (GOWN DISPOSABLE) ×4
GOWN STRL REUS W/TWL XL LVL3 (GOWN DISPOSABLE) ×4
HEMOSTAT SURGICEL 2X3 (HEMOSTASIS) ×3 IMPLANT
IV NS 250ML (IV SOLUTION) ×2
IV NS 250ML BAXH (IV SOLUTION) ×1 IMPLANT
KIT TURNOVER KIT A (KITS) ×3 IMPLANT
LABEL OR SOLS (LABEL) ×3 IMPLANT
LOOP RED MAXI  1X406MM (MISCELLANEOUS) ×4
LOOP VESSEL MAXI 1X406 RED (MISCELLANEOUS) ×2 IMPLANT
LOOP VESSEL MINI 0.8X406 BLUE (MISCELLANEOUS) ×1 IMPLANT
LOOPS BLUE MINI 0.8X406MM (MISCELLANEOUS) ×2
NEEDLE FILTER BLUNT 18X 1/2SAF (NEEDLE) ×2
NEEDLE FILTER BLUNT 18X1 1/2 (NEEDLE) ×1 IMPLANT
NEEDLE HYPO 25X1 1.5 SAFETY (NEEDLE) ×3 IMPLANT
NS IRRIG 1000ML POUR BTL (IV SOLUTION) ×3 IMPLANT
PACK BASIN MAJOR ARMC (MISCELLANEOUS) ×3 IMPLANT
PATCH CAROTID ECM VASC 1X10 (Prosthesis & Implant Heart) ×3 IMPLANT
PENCIL ELECTRO HAND CTR (MISCELLANEOUS) IMPLANT
SHUNT W TPORT 9FR PRUITT F3 (SHUNT) ×3 IMPLANT
SUT MNCRL 4-0 (SUTURE) ×2
SUT MNCRL 4-0 27XMFL (SUTURE) ×1
SUT PROLENE 6 0 BV (SUTURE) ×12 IMPLANT
SUT PROLENE 7 0 BV 1 (SUTURE) ×6 IMPLANT
SUT SILK 2 0 (SUTURE) ×2
SUT SILK 2-0 18XBRD TIE 12 (SUTURE) ×1 IMPLANT
SUT SILK 3 0 (SUTURE) ×2
SUT SILK 3-0 18XBRD TIE 12 (SUTURE) ×1 IMPLANT
SUT SILK 4 0 (SUTURE) ×2
SUT SILK 4-0 18XBRD TIE 12 (SUTURE) ×1 IMPLANT
SUT VIC AB 3-0 SH 27 (SUTURE) ×4
SUT VIC AB 3-0 SH 27X BRD (SUTURE) ×2 IMPLANT
SUTURE MNCRL 4-0 27XMF (SUTURE) ×1 IMPLANT
SYR 10ML LL (SYRINGE) ×6 IMPLANT
SYR 20CC LL (SYRINGE) ×3 IMPLANT
TOWEL OR 17X26 4PK STRL BLUE (TOWEL DISPOSABLE) ×3 IMPLANT
TRAY FOLEY MTR SLVR 16FR STAT (SET/KITS/TRAYS/PACK) ×3 IMPLANT
TUBING CONNECTING 10 (TUBING) IMPLANT
TUBING CONNECTING 10' (TUBING)

## 2018-02-12 NOTE — Transfer of Care (Signed)
Immediate Anesthesia Transfer of Care Note  Patient: BECKET WECKER  Procedure(s) Performed: ENDARTERECTOMY CAROTID (Right )  Patient Location: PACU  Anesthesia Type:General  Level of Consciousness: drowsy and patient cooperative  Airway & Oxygen Therapy: Patient Spontanous Breathing and Patient connected to face mask oxygen  Post-op Assessment: Report given to RN and Post -op Vital signs reviewed and stable  Post vital signs: Reviewed and stable  Last Vitals:  Vitals Value Taken Time  BP 142/53 02/12/2018  3:47 PM  Temp 36.7 C 02/12/2018  3:47 PM  Pulse 71 02/12/2018  3:52 PM  Resp 18 02/12/2018  3:52 PM  SpO2 99 % 02/12/2018  3:52 PM  Vitals shown include unvalidated device data.  Last Pain:  Vitals:   02/12/18 1547  TempSrc:   PainSc: Asleep         Complications: No apparent anesthesia complications

## 2018-02-12 NOTE — Anesthesia Procedure Notes (Signed)
Arterial Line Insertion Start/End12/02/2018 1:48 PM, 02/12/2018 1:52 PM Performed by: Emmie Niemann, MD, Aline Brochure, CRNA, CRNA  Patient location: Pre-op. Preanesthetic checklist: patient identified, IV checked, site marked, risks and benefits discussed, surgical consent, monitors and equipment checked, pre-op evaluation, timeout performed and anesthesia consent radial was placed Catheter size: 20 Fr Hand hygiene performed , maximum sterile barriers used  and Seldinger technique used  Attempts: 2 Procedure performed without using ultrasound guided technique. Following insertion, dressing applied and Biopatch. Post procedure assessment: normal and unchanged  Patient tolerated the procedure well with no immediate complications.

## 2018-02-12 NOTE — Op Note (Addendum)
[name]   OPERATIVE NOTE  PROCEDURE:   1.  Right carotid endarterectomy with CorMatrix arterial patch reconstruction  PRE-OPERATIVE DIAGNOSIS: 1.  high grade right carotid stenosis   POST-OPERATIVE DIAGNOSIS: same as above   SURGEON: Leotis Pain, MD  ASSISTANT(S): Hezzie Bump, PA-C  ANESTHESIA: general  ESTIMATED BLOOD LOSS: 25 cc  FINDING(S): 1.  right carotid plaque.  SPECIMEN(S):  Right Carotid plaque (sent to Pathology)  INDICATIONS:   Jason House is a 77 y.o. male who presents with right carotid stenosis of greater than 75%.  I discussed with the patient the risks, benefits, and alternatives to carotid endarterectomy.  I discussed the differences between carotid stenting and carotid endarterectomy. I discussed the procedural details of carotid endarterectomy with the patient.  The patient is aware that the risks of carotid endarterectomy include but are not limited to: bleeding, infection, stroke, myocardial infarction, death, cranial nerve injuries both temporary and permanent, neck hematoma, possible airway compromise, labile blood pressure post-operatively, cerebral hyperperfusion syndrome, and possible need for additional interventions in the future. The patient is aware of the risks and agrees to proceed forward with the procedure. An assistant was present during the procedure to help facilitate the exposure and expedite the procedure.  DESCRIPTION: After full informed written consent was obtained from the patient, the patient was brought back to the operating room and placed supine upon the operating table.  Prior to induction, the patient received IV antibiotics.  After obtaining adequate anesthesia, the patient was placed into a modified beach chair position with a shoulder roll in place and the patient's neck slightly hyperextended and rotated away from the surgical site.  The patient was prepped in the standard fashion for a carotid  endarterectomy. The assistant provided retraction and mobilization to help facilitate exposure and expedite the procedure throughout the entire procedure.  This included following suture, using retractors, and optimizing lighting. I made an incision anterior to the right sternocleidomastoid muscle and dissected down through the subcutaneous tissue.  The platysmas was opened with electrocautery.  Then I dissected down to the internal jugular vein.  There was not a dominant facial vein identified, but 2 smaller veins which were ligated and divided between silk ties.  This was dissected posteriorly until I obtained visualization of the common carotid artery.  This was dissected out and then a vessel loop was placed around the common carotid artery.  I then dissected in a periadventitial fashion along the common carotid artery up to the bifurcation.  I then identified the external carotid artery and the superior thyroid artery.  I placed a vessel loop around the superior thyroid artery, and I also dissected out the external carotid artery and placed a vessel loop around it. In the process of this dissection, the hypoglossal nerve was identified and protected from harm.  I then dissected out the internal carotid artery until I identified an area in the internal carotid artery clearly above the stenosis.  I dissected slightly distal to this area, and placed a vessel loop around the artery.  At this point, we gave the patient 6000 units of intravenous heparin.  After this was allowed to circulate for several minutes, I pulled up control on the vessel loops to clamp the internal carotid artery, external carotid artery, superior thyroid artery, and then the common carotid artery.  I then made an arteriotomy in the common carotid artery with a 11 blade, and extended the arteriotomy with a Potts scissor down into  the common carotid artery, then I carried the arteriotomy through the bifurcation into the internal carotid artery  until I reached an area that was not diseased.  At this point, I took the [place] shunt that previously been prepared and I inserted it into the internal carotid artery first, and then into the common carotid artery taking care to flush and de-air prior to release of control. At this point, I started the endarterectomy in the common carotid artery with a [place] elevator and carried this dissection down into the common carotid artery circumferentially.  Then I transected the plaque at a segment where it was adherent and transected the plaque with Potts scissors.  I then carried this dissection up into the external carotid artery.  The plaque was extracted by unclamping the external carotid artery and performing an eversion endarterectomy.  The dissection was then carried into the internal carotid artery where a nice feathered end point was created with gentle traction.  I passed the plaque off the field as a specimen. At this point I removed all loose flecks and remaining disease possible.  At this point, I was satisfied that the minimal remaining disease was densely adherent to the wall and wall integrity was intact. The distal endpoint was tacked down with two 7-0 Prolene sutures.  I then fashioned a CorMatrix arterial patch for the artery and sewed it in place with two running stitch of 6-0 Prolene.  I started at the distal endpoint and ran one half the length of the arteriotomy.  I then cut and beveled the patch to an appropriate length to match the arteriotomy.  I started the second 6-0 Prolene at the proximal end point.  The medial suture line was completed and the lateral suture line was run approximately one quarter the length of the arteriotomy.  Prior to completing this patch angioplasty, I removed the shunt first from the internal carotid artery, from which there was excellent backbleeding, and clamped it.  Then I removed the shunt from the common carotid artery, from which there was excellent  antegrade bleeding, and then clamped it.  At this point, I allowed the external carotid artery to backbleed, which was excellent.  Then I instilled heparinized saline in this patched artery and then completed the patch angioplasty in the usual fashion.  First, I released the clamp on the external carotid artery, then I released it on the common carotid artery.  After waiting a few seconds, I then released it on the internal carotid artery. Several minutes of pressure were held and 6-0 Prolene patch sutures were used as need for hemostasis.  At this point, I placed Surgicel and Evicel topical hemostatic agents.  There was no more active bleeding in the surgical site.  The sternocleidomastoid space was closed with three interrupted 3-0 Vicryl sutures. I then reapproximated the platysma muscle with a running stitch of 3-0 Vicryl.  The skin was then closed with a running subcuticular 4-0 Monocryl.  The skin was then cleaned, dried and Dermabond was used to reinforce the skin closure.  The patient awakened and was taken to the recovery room in stable condition, following commands and moving all four extremities without any apparent deficits.    COMPLICATIONS: none  CONDITION: stable  Leotis Pain  02/12/2018, 3:21 PM    This note was created with Dragon Medical transcription system. Any errors in dictation are purely unintentional.

## 2018-02-12 NOTE — Anesthesia Postprocedure Evaluation (Signed)
Anesthesia Post Note  Patient: Jason House  Procedure(s) Performed: ENDARTERECTOMY CAROTID (Right )  Patient location during evaluation: PACU Anesthesia Type: General Level of consciousness: awake and alert Pain management: pain level controlled Vital Signs Assessment: post-procedure vital signs reviewed and stable Respiratory status: spontaneous breathing, nonlabored ventilation, respiratory function stable and patient connected to nasal cannula oxygen Cardiovascular status: blood pressure returned to baseline and stable Postop Assessment: no apparent nausea or vomiting Anesthetic complications: no     Last Vitals:  Vitals:   02/12/18 1929 02/12/18 2000  BP: (!) 142/51 (!) 128/46  Pulse:  70  Resp:  14  Temp:  36.4 C  SpO2:  96%    Last Pain:  Vitals:   02/12/18 2000  TempSrc: Oral  PainSc: [place]

## 2018-02-12 NOTE — Anesthesia Preprocedure Evaluation (Signed)
Anesthesia Evaluation  Patient identified by MRN, date of birth, ID band Patient awake    Reviewed: Allergy & Precautions, H&P , NPO status , Patient's Chart, lab work & pertinent test results, reviewed documented beta blocker date and time   Airway Mallampati: II  TM Distance: >3 FB Neck ROM: full    Dental  (+) Teeth Intact, Edentulous Upper, Edentulous Lower   Pulmonary COPD, former smoker,    Pulmonary exam normal        Cardiovascular Exercise Tolerance: Good hypertension, On Medications + CAD  Normal cardiovascular exam Rhythm:regular Rate:Normal     Neuro/Psych negative neurological ROS  negative psych ROS   GI/Hepatic negative GI ROS, Neg liver ROS,   Endo/Other  Hypothyroidism   Renal/GU Renal disease  negative genitourinary   Musculoskeletal   Abdominal   Peds  Hematology  (+) Blood dyscrasia, anemia ,   Anesthesia Other Findings Past Medical History: No date: Anemia No date: COPD (chronic obstructive pulmonary disease) (HCC) No date: Coronary artery disease No date: Glaucoma No date: H/O ETOH abuse No date: History of shingles No date: Hyperlipidemia No date: Hypertension No date: Hypothyroidism Past Surgical History: 09/23/2014: COLONOSCOPY WITH PROPOFOL; N/A     Comment:  Procedure: COLONOSCOPY WITH PROPOFOL;  Surgeon: Josefine Class, MD;  Location[facility] ENDOSCOPY;  Service:               Endoscopy;  Laterality: N/A; No date: CORONARY ANGIOPLASTY No date: CORONARY ARTERY BYPASS GRAFT 04/22/2017: ERCP; N/A     Comment:  Procedure: ENDOSCOPIC RETROGRADE               CHOLANGIOPANCREATOGRAPHY (ERCP);  Surgeon: Lucilla Lame,               MD;  Location: [facility] ENDOSCOPY;  Service: Endoscopy;                Laterality: N/A; No date: EYE SURGERY; Bilateral     Comment:  cataract   Reproductive/Obstetrics negative OB ROS                              Anesthesia Physical Anesthesia Plan  ASA: III  Anesthesia Plan: General ETT   Post-op Pain Management:    Induction:   PONV Risk Score and Plan:   Airway Management Planned:   Additional Equipment: Arterial line  Intra-op Plan:   Post-operative Plan:   Informed Consent: I have reviewed the patients History and Physical, chart, labs and discussed the procedure including the risks, benefits and alternatives for the proposed anesthesia with the patient or authorized representative who has indicated his/her understanding and acceptance.   Dental Advisory Given  Plan Discussed with: CRNA  Anesthesia Plan Comments:         Anesthesia Quick Evaluation

## 2018-02-12 NOTE — Anesthesia Post-op Follow-up Note (Signed)
Anesthesia QCDR form completed.        

## 2018-02-12 NOTE — H&P (Signed)
[facility] Admission History & Physical  MRN : [id number]  Jason House is a 77 y.o. (29-Sep-1940) male who presents with chief complaint of No chief complaint on file. Marland Kitchen  History of Present Illness: Patient presents today for right carotid endarterectomy.  Was seen in the office a little over a month ago.  No new complaints. No new symptoms of his carotid disease. Specifically, the patient denies amaurosis fugax, speech or swallowing difficulties, or arm or leg weakness or numbness The patient has had slow progression of his right carotid artery stenosis and now appears to be in the 75% or greater range by CT angiogram.  I have independently reviewed his CT angiogram.  The patient has mild to moderate left carotid artery stenosis but this is nowhere near a surgical range as of yet.  Current Facility-Administered Medications  Medication Dose Route Frequency Provider Last Rate Last Dose  . ceFAZolin (ANCEF) 2-4 GM/100ML-% IVPB           . ceFAZolin (ANCEF) IVPB 2g/100 mL premix  2 g Intravenous On Call to [place], NP      . lactated ringers infusion   Intravenous Continuous Martha Clan, MD        Past Medical History:  Diagnosis Date  . Anemia   . COPD (chronic obstructive pulmonary disease) ([place])   . Coronary artery disease   . Glaucoma   . H/O ETOH abuse   . History of shingles   . Hyperlipidemia   . Hypertension   . Hypothyroidism     Past Surgical History:  Procedure Laterality Date  . COLONOSCOPY WITH PROPOFOL N/A 09/23/2014   Procedure: COLONOSCOPY WITH PROPOFOL;  Surgeon: Josefine Class, MD;  Location: [facility] ENDOSCOPY;  Service: Endoscopy;  Laterality: N/A;  . CORONARY ANGIOPLASTY    . CORONARY ARTERY BYPASS GRAFT    . ERCP N/A 04/22/2017   Procedure: ENDOSCOPIC RETROGRADE CHOLANGIOPANCREATOGRAPHY (ERCP);  Surgeon: Lucilla Lame, MD;  Location: [facility] ENDOSCOPY;  Service: Endoscopy;  Laterality: N/A;  . EYE SURGERY Bilateral    cataract    Social History Social History   Tobacco Use  . Smoking status: Former Research scientist (life sciences)  . Smokeless tobacco: Never Used  . Tobacco comment: 30 years ago  Substance Use Topics  . Alcohol use: No    Frequency: Never    Comment: stopped 2010  . Drug use: No    Family History Family History  Problem Relation Age of Onset  . Diabetes Mother   . Cancer Sister   . Throat cancer Brother   . Cirrhosis Brother   . COPD Sister   . COPD Sister   . Diabetes Sister     No Known Allergies   REVIEW OF SYSTEMS (Negative unless checked)  Constitutional: [] ?Weight loss  [] ?Fever  [] ?Chills Cardiac: [] ?Chest pain   [] ?Chest pressure   [] ?Palpitations   [] ?Shortness of breath when laying flat   [] ?Shortness of breath at rest   [x] ?Shortness of breath with exertion. Vascular:  [] ?Pain in legs with walking   [] ?Pain in legs at rest   [] ?Pain in legs when laying flat   [] ?Claudication   [] ?Pain in feet when walking  [] ?Pain in feet at rest  [] ?Pain in feet when laying flat   [] ?History of DVT   [] ?Phlebitis   [] ?Swelling in legs   [] ?Varicose veins   [] ?Non-healing ulcers Pulmonary:   [] ?Uses home oxygen   [] ?Productive cough   [] ?Hemoptysis   [] ?Wheeze  [x] ?COPD   [] ?  Asthma Neurologic:  [] ?Dizziness  [] ?Blackouts   [] ?Seizures   [] ?History of stroke   [] ?History of TIA  [] ?Aphasia   [] ?Temporary blindness   [] ?Dysphagia   [] ?Weakness or numbness in arms   [] ?Weakness or numbness in legs Musculoskeletal:  [x] ?Arthritis   [] ?Joint swelling   [x] ?Joint pain   [] ?Low back pain Hematologic:  [] ?Easy bruising  [] ?Easy bleeding   [] ?Hypercoagulable state   [] ?Anemic  [] ?Hepatitis Gastrointestinal:  [] ?Blood in stool   [] ?Vomiting blood  [] ?Gastroesophageal reflux/heartburn   [] ?Difficulty swallowing. Genitourinary:  [x] ?Chronic kidney disease   [] ?Difficult urination  [] ?Frequent urination  [] ?Burning with urination   [] ?Blood in urine Skin:  [] ?Rashes   [] ?Ulcers   [] ?Wounds Psychological:   [] ?History of anxiety   [] ? History of major depression.  Physical Examination  Vitals:   02/12/18 1229  BP: (!) 162/58  Pulse: (!) 51  Resp: 16  Temp: 97.7 F (36.5 C)  TempSrc: Oral  SpO2: 100%  Weight: 70.6 kg  Height: 5\' 7"  (1.702 m)   Body mass index is 24.36 kg/m. Gen: WD/WN, NAD Head: [place]/AT, No temporalis wasting.  Ear/Nose/Throat: Hearing grossly intact, nares w/o erythema or drainage, oropharynx w/o Erythema/Exudate,  Eyes: Conjunctiva clear, sclera non-icteric Neck: Trachea midline.  No JVD.  Pulmonary:  Good air movement, respirations not labored, no use of accessory muscles.  Cardiac: RRR, normal S1, S2. Vascular:  Vessel Right Left  Radial Palpable Palpable                                    Musculoskeletal: M/S 5/5 throughout.  Extremities without ischemic changes.  No deformity or atrophy.  Neurologic: Sensation grossly intact in extremities.  Symmetrical.  Speech is fluent. Motor exam as listed above. Psychiatric: Judgment intact, Mood & affect appropriate for pt's clinical situation. Dermatologic: No rashes or ulcers noted.  No cellulitis or open wounds.      CBC Lab Results  Component Value Date   WBC 16.0 (H) 05/20/2017   HGB 13.8 05/20/2017   HCT 41.6 05/20/2017   MCV 91.0 05/20/2017   PLT 429 05/20/2017    BMET    Component Value Date/Time   NA 135 05/20/2017 1524   K 3.7 05/20/2017 1524   CL 100 (L) 05/20/2017 1524   CO2 25 05/20/2017 1524   GLUCOSE 107 (H) 05/20/2017 1524   BUN 35 (H) 05/20/2017 1524   CREATININE 1.44 (H) 05/20/2017 1524   CREATININE 1.29 07/05/2013 1004   CALCIUM 9.0 05/20/2017 1524   CALCIUM 9.2 07/05/2013 1004   GFRNONAA 45 (L) 05/20/2017 1524   GFRNONAA 55 (L) 07/05/2013 1004   GFRAA 53 (L) 05/20/2017 1524   GFRAA >60 07/05/2013 1004   CrCl cannot be calculated (Patient's most recent lab result is older than the maximum 21 days allowed.).  COAG Lab Results  Component Value Date   INR 1.03  02/06/2018   INR 0.98 05/20/2017    Radiology No results found.   Assessment/Plan Essential hypertension blood pressure control important in reducing the progression of atherosclerotic disease. On appropriate oral medications.   Atherosclerotic heart disease of native coronary artery without angina pectoris Sounds like he has done well from a cardiac standpoint.  His cardiologist feels he is an acceptable risk for carotid endarterectomy.  Bilateral carotid artery stenosis The patient has had slow progression of his right carotid artery stenosis and now appears to be in the 75% or greater  range by CT angiogram.  I have independently reviewed his CT angiogram.  The patient has mild to moderate left carotid artery stenosis but this is nowhere near a surgical range as of yet.  I discussed the risks and benefits of carotid endarterectomy.  I have discussed the reason and rationale for carotid endarterectomy primarily being stroke risk reduction long-term.  The patient voices his understanding and desires to proceed with right carotid endarterectomy.   Leotis Pain, MD  02/12/2018 12:32 PM

## 2018-02-12 NOTE — Anesthesia Procedure Notes (Signed)
Procedure Name: Intubation Date/Time: 02/12/2018 1:42 PM Performed by: Gentry Fitz, CRNA Pre-anesthesia Checklist: Patient identified, Emergency Drugs available, Suction available, Patient being monitored and Timeout performed Patient Re-evaluated:Patient Re-evaluated prior to induction Oxygen Delivery Method: Circle system utilized Preoxygenation: Pre-oxygenation with 100% oxygen Induction Type: IV induction Ventilation: Mask ventilation without difficulty Laryngoscope Size: Mac and 4 Grade View: Grade I Tube type: Oral Tube size: 7.5 mm Number of attempts: 1 Airway Equipment and Method: Stylet Placement Confirmation: ETT inserted through vocal cords under direct vision,  positive ETCO2 and breath sounds checked- equal and bilateral Secured at: 22 cm Tube secured with: Tape Dental Injury: Teeth and Oropharynx as per pre-operative assessment

## 2018-02-13 ENCOUNTER — Encounter: Payer: Self-pay | Admitting: Vascular Surgery

## 2018-02-13 DIAGNOSIS — I6529 Occlusion and stenosis of unspecified carotid artery: Secondary | ICD-10-CM

## 2018-02-13 DIAGNOSIS — Z9889 Other specified postprocedural states: Secondary | ICD-10-CM

## 2018-02-13 LAB — BASIC METABOLIC PANEL
Anion gap: 5 (ref 5–15)
BUN: 20 mg/dL (ref 8–23)
CO2: 21 mmol/L — AB (ref 22–32)
Calcium: 8 mg/dL — ABNORMAL LOW (ref 8.9–10.3)
Chloride: 106 mmol/L (ref 98–111)
Creatinine, Ser: 1.25 mg/dL — ABNORMAL HIGH (ref 0.61–1.24)
GFR calc non Af Amer: 55 mL/min — ABNORMAL LOW (ref >60)
Glucose, Bld: 146 mg/dL — ABNORMAL HIGH (ref 70–99)
Potassium: 3.9 mmol/L (ref 3.5–5.1)
Sodium: 132 mmol/L — ABNORMAL LOW (ref 135–145)

## 2018-02-13 LAB — CBC
HCT: 25.1 % — ABNORMAL LOW (ref 39.0–52.0)
Hemoglobin: 8.4 g/dL — ABNORMAL LOW (ref 13.0–17.0)
MCH: 32.2 pg (ref 26.0–34.0)
MCHC: 33.5 g/dL (ref 30.0–36.0)
MCV: 96.2 fL (ref 80.0–100.0)
Platelets: 226 10*3/uL (ref 150–400)
RBC: 2.61 MIL/uL — ABNORMAL LOW (ref 4.22–5.81)
RDW: 14.3 % (ref 11.5–15.5)
WBC: 9.3 10*3/uL (ref 4.0–10.5)
nRBC: 0 % (ref 0.0–0.2)

## 2018-02-13 MED ORDER — TRAMADOL HCL 50 MG PO TABS: 50.0000 mg | ORAL_TABLET | Freq: Four times a day (QID) | ORAL | 0 refills | Status: AC | PRN

## 2018-02-13 NOTE — Discharge Instructions (Signed)
No driving until you follow up for your first post-operative visit.  No driving if you take any pain medication. You can shower as of Sunday. Please keep your incision clean and dry.

## 2018-02-13 NOTE — Discharge Summary (Signed)
[facility]    Discharge Summary   Patient ID:  Jason House MRN: [id number] DOB/AGE: [birth year] 77 y.o.  Admit date: 02/12/2018 Discharge date: 02/13/2018 Date of Surgery: 02/12/2018 Surgeon: Surgeon(s): Dew, Erskine Squibb, MD  Admission Diagnosis: CAROTID ARTERY STENOSIS  Discharge Diagnoses:  CAROTID ARTERY STENOSIS  Secondary Diagnoses: Past Medical History:  Diagnosis Date  . Anemia   . COPD (chronic obstructive pulmonary disease) ([place])   . Coronary artery disease   . Glaucoma   . H/O ETOH abuse   . History of shingles   . Hyperlipidemia   . Hypertension   . Hypothyroidism    Procedure(s): ENDARTERECTOMY CAROTID  Discharged Condition: good  HPI:  The patient is a 77 year old male with a past medical history of hypothyroidism, hypertension, hyperlipidemia, glaucoma, coronary artery disease, chronic kidney disease, COPD, anemia who presented with asymptomatic critical right carotid stenosis.   On February 12, 2018, the patient underwent a right carotid endarterectomy with CorMatrix arterial patch reconstruction.  He tolerated the procedure well was transferred to the ICU for observation overnight.  The patient stated surgery was unremarkable.  During the patient's brief inpatient stay, his diet was advanced, his Foley was removed, his pain was controlled with the use of p.o. pain medication and he was ambulating independently.  Hospital Course:  Jason House is a 77 y.o. male is S/P Right  Procedure(s): ENDARTERECTOMY CAROTID  Extubated: POD # 0  Physical exam:  A&Ox3, NAD Face: Symmetrical, Tongue Midline Neck: Incision: clean, dry and intact. Some ecchymosis surrounding incision, minimal swelling, trachea midline Cardiac: RRR Pulm: CTA Bilaterally Abdomen: Soft, non-tender, non-distended, (+) bowel sounds Extremities: Warm, Non-tender, Minimal Edema Neuro: 5/5 strength upper and lower extremity  Post-op wounds clean, dry,  intact or healing well  Labs as below  Complications:none  Consults: None  Significant Diagnostic Studies: CBC Lab Results  Component Value Date   WBC 9.3 02/13/2018   HGB 8.4 (L) 02/13/2018   HCT 25.1 (L) 02/13/2018   MCV 96.2 02/13/2018   PLT 226 02/13/2018    BMET    Component Value Date/Time   NA 132 (L) 02/13/2018 0407   K 3.9 02/13/2018 0407   CL 106 02/13/2018 0407   CO2 21 (L) 02/13/2018 0407   GLUCOSE 146 (H) 02/13/2018 0407   BUN 20 02/13/2018 0407   CREATININE 1.25 (H) 02/13/2018 0407   CREATININE 1.29 07/05/2013 1004   CALCIUM 8.0 (L) 02/13/2018 0407   CALCIUM 9.2 07/05/2013 1004   GFRNONAA 55 (L) 02/13/2018 0407   GFRNONAA 55 (L) 07/05/2013 1004   GFRAA >60 02/13/2018 0407   GFRAA >60 07/05/2013 1004   COAG Lab Results  Component Value Date   INR 1.03 02/06/2018   INR 0.98 05/20/2017   Disposition:  Discharge to :Home  Allergies as of 02/13/2018   No Known Allergies     Medication List    TAKE these medications   Albuterol Sulfate 108 (90 Base) MCG/ACT Aepb Commonly known as:  PROAIR RESPICLICK Inhale 1 puff into the lungs every 4 (four) hours as needed (shortness of breath).   aspirin EC 81 MG tablet Take 81 mg by mouth daily.   atorvastatin 80 MG tablet Commonly known as:  LIPITOR Take 80 mg by mouth daily.   BYSTOLIC 5 MG tablet Generic drug:  nebivolol Take 5 mg by mouth daily.   clopidogrel 75 MG tablet Commonly known as:  PLAVIX Take 75 mg by mouth daily.   colchicine 0.6 MG  tablet Take 2 tablets (1.2mg ) by mouth at first sign of gout flare followed by 1 tablet (0.6mg ) after 1 hour. (Max 1.8mg  within 1 hour)   cyclobenzaprine 10 MG tablet Commonly known as:  FLEXERIL Take 10 mg by mouth 3 (three) times daily as needed for muscle spasms.   dorzolamide-timolol 22.3-6.8 MG/ML ophthalmic solution Commonly known as:  COSOPT Place 1 drop into both eyes 2 (two) times daily.   guaiFENesin 600 MG 12 hr tablet Commonly known  as:  MUCINEX Take 1,200 mg by mouth 2 (two) times daily.   hydrochlorothiazide 12.5 MG capsule Commonly known as:  MICROZIDE Take 12.5 mg by mouth every evening. Takes 1/2 of a 25 mg tab   latanoprost 0.005 % ophthalmic solution Commonly known as:  XALATAN Place 1 drop into both eyes at bedtime.   levothyroxine 50 MCG tablet Commonly known as:  SYNTHROID, LEVOTHROID Take 1 tablet by mouth daily before breakfast.   lisinopril 10 MG tablet Commonly known as:  PRINIVIL,ZESTRIL Take 10 mg by mouth daily.   ranolazine 1000 MG SR tablet Commonly known as:  RANEXA 500 mg 2 (two) times daily.   Spacer/Aero Chamber Mouthpiece Misc 1 Units by Does not apply route every 4 (four) hours as needed (wheezing).   traMADol 50 MG tablet Commonly known as:  ULTRAM Take 1 tablet (50 mg total) by mouth every 6 (six) hours as needed for up to 7 days for moderate pain or severe pain.   vitamin B-12 1000 MCG tablet Commonly known as:  CYANOCOBALAMIN Take 1,000 mcg by mouth daily.      Verbal and written Discharge instructions given to the patient. Wound care per Discharge AVS Follow-up Information    Dew, Erskine Squibb, MD Follow up in 1 week(s).   Specialties:  Vascular Surgery, Radiology, Interventional Cardiology Why:  First post-op check. No studies. Can see midlevel. Contact information: [place] [address] [phone]          Signed: Sela Hua, PA-C  02/13/2018, 10:08 AM

## 2018-02-16 ENCOUNTER — Other Ambulatory Visit: Payer: Self-pay

## 2018-02-16 ENCOUNTER — Telehealth (INDEPENDENT_AMBULATORY_CARE_PROVIDER_SITE_OTHER): Payer: Self-pay | Admitting: Vascular Surgery

## 2018-02-16 NOTE — Telephone Encounter (Signed)
Spoke with the patient and explained that numbness was normal, I asked if he had any other symptoms and the patient stated he was clearing his throat. I explained that due to the tube in his throat from the surgery that could cause this along with some soreness. The patient had surgery on 02/12/18 ( carotid endarterectomy ) with Dr. Lucky Cowboy. He will make a f/u appt with Dr. Lucky Cowboy in our office

## 2018-02-16 NOTE — Patient Outreach (Signed)
[place] [facility]) Care Management  02/16/2018  DEANDRAE WAJDA 1940/10/04 [id number]   EMMI- General Discharge RED ON EMMI ALERT Day # 1 Date: 02/15/18 Red Alert Reason:  Scheduled Follow-up? No  Outreach attempt: spoke with patient.  He reports that he is doing well.  Discussed red alert.  Patient reports that he just got his appointment today and it is on Friday.  Patient states he has transportation to his appointment.  He has all his medications and has no questions about them.  Discussed with patient signs of infection to surgical site and contacting the physician. He verbalized understanding and denies any further needs at this time.     Plan: RN CM will close case.    Jone Baseman, RN, MSN [facility] Care Management Care Management Coordinator Direct Line [phone] Toll Free: [phone]  Fax: [phone]

## 2018-02-17 LAB — SURGICAL PATHOLOGY

## 2018-02-18 DIAGNOSIS — D649 Anemia, unspecified: Secondary | ICD-10-CM | POA: Diagnosis not present

## 2018-02-18 DIAGNOSIS — I10 Essential (primary) hypertension: Secondary | ICD-10-CM | POA: Diagnosis not present

## 2018-02-18 DIAGNOSIS — Z79899 Other long term (current) drug therapy: Secondary | ICD-10-CM | POA: Diagnosis not present

## 2018-02-19 ENCOUNTER — Other Ambulatory Visit: Payer: Self-pay

## 2018-02-19 NOTE — Patient Outreach (Signed)
[place] [facility]) Care Management  02/19/2018  LA DIBELLA 08-29-1940 [id number]   EMMI- General Discharge RED ON EMMI ALERT Day # 4 Date: 02/18/18 Red Alert Reason:  Sad/hopeless/anxious/empty? Yes   Outreach attempt: No answer.  HIPAA compliant voice message left.     Plan: RN CM will attempt again within 4 business days and send a letter.    Jone Baseman, RN, MSN [facility] Care Management Care Management Coordinator Direct Line [phone] Toll Free: [phone]  Fax: [phone]

## 2018-02-19 NOTE — Patient Outreach (Signed)
[facility]) Care Management  02/19/2018  Keena Heesch [facility] Nov 28, 1940 [id number]   EMMI- General Discharge RED ON EMMI ALERT Day # 4 Date:02/18/18 Red Alert Reason: Sad/hopeless/anxious/empty? Yes  Incoming call from patient. He reports that he is doing good.  Addressed red alert.  Patient states that it was a mistake and he did not know what to do or who to call to correct it.  Patient declines any other needs at this time.   Plan: RN CM will close case.  Jone Baseman, RN, MSN [place] Management Care Management Coordinator Direct Line [phone] Cell [phone] Toll Free: [phone]  Fax: [phone]

## 2018-02-20 ENCOUNTER — Encounter (INDEPENDENT_AMBULATORY_CARE_PROVIDER_SITE_OTHER): Payer: Self-pay | Admitting: Vascular Surgery

## 2018-02-20 ENCOUNTER — Ambulatory Visit (INDEPENDENT_AMBULATORY_CARE_PROVIDER_SITE_OTHER): Payer: PPO | Admitting: Vascular Surgery

## 2018-02-20 ENCOUNTER — Other Ambulatory Visit: Payer: Self-pay

## 2018-02-20 VITALS — BP 148/57 | HR 55 | Resp 14 | Ht 66.0 in | Wt 158.0 lb

## 2018-02-20 DIAGNOSIS — E785 Hyperlipidemia, unspecified: Secondary | ICD-10-CM

## 2018-02-20 DIAGNOSIS — I10 Essential (primary) hypertension: Secondary | ICD-10-CM

## 2018-02-20 DIAGNOSIS — I6523 Occlusion and stenosis of bilateral carotid arteries: Secondary | ICD-10-CM

## 2018-02-20 NOTE — Progress Notes (Signed)
Patient ID: Jason House, male   DOB: [birth year], 77 y.o.   MRN: [id number]  Chief Complaint  Patient presents with  . Follow-up    post op from [place] is a 77 y.o. male.  Patient returns in follow-up after right carotid endarterectomy last week.  He is doing well.  He has not had any focal neurologic symptoms or major postoperative complications.  He had some significant bruising which has tracked down his chest as well as some swelling in his neck which is slowly improving.  His energy is back near normal.  He has no other complaints today   Past Medical History:  Diagnosis Date  . Anemia   . COPD (chronic obstructive pulmonary disease) ([place])   . Coronary artery disease   . Glaucoma   . H/O ETOH abuse   . History of shingles   . Hyperlipidemia   . Hypertension   . Hypothyroidism     Past Surgical History:  Procedure Laterality Date  . COLONOSCOPY WITH PROPOFOL N/A 09/23/2014   Procedure: COLONOSCOPY WITH PROPOFOL;  Surgeon: Josefine Class, MD;  Location: [facility] ENDOSCOPY;  Service: Endoscopy;  Laterality: N/A;  . CORONARY ANGIOPLASTY    . CORONARY ARTERY BYPASS GRAFT    . ENDARTERECTOMY Right 02/12/2018   Procedure: ENDARTERECTOMY CAROTID;  Surgeon: Algernon Huxley, MD;  Location: [facility] ORS;  Service: Vascular;  Laterality: Right;  . ERCP N/A 04/22/2017   Procedure: ENDOSCOPIC RETROGRADE CHOLANGIOPANCREATOGRAPHY (ERCP);  Surgeon: Lucilla Lame, MD;  Location[facility] ENDOSCOPY;  Service: Endoscopy;  Laterality: N/A;  . EYE SURGERY Bilateral    cataract      No Known Allergies  Current Outpatient Medications  Medication Sig Dispense Refill  . Albuterol Sulfate (PROAIR RESPICLICK) 403 (90 Base) MCG/ACT AEPB Inhale 1 puff into the lungs every 4 (four) hours as needed (shortness of breath). 1 each 0  . aspirin EC 81 MG tablet Take 81 mg by mouth daily.    Marland Kitchen atorvastatin (LIPITOR) 80 MG tablet Take 80 mg by mouth daily.    Marland Kitchen BYSTOLIC 5 MG tablet Take 5  mg by mouth daily.  1  . clopidogrel (PLAVIX) 75 MG tablet Take 75 mg by mouth daily.    . colchicine 0.6 MG tablet Take 2 tablets (1.2mg ) by mouth at first sign of gout flare followed by 1 tablet (0.6mg ) after 1 hour. (Max 1.8mg  within 1 hour)    . cyclobenzaprine (FLEXERIL) 10 MG tablet Take 10 mg by mouth 3 (three) times daily as needed for muscle spasms.    . dorzolamide-timolol (COSOPT) 22.3-6.8 MG/ML ophthalmic solution Place 1 drop into both eyes 2 (two) times daily.    Marland Kitchen guaiFENesin (MUCINEX) 600 MG 12 hr tablet Take 1,200 mg by mouth 2 (two) times daily.    . hydrochlorothiazide (MICROZIDE) 12.5 MG capsule Take 12.5 mg by mouth every evening. Takes 1/2 of a 25 mg tab    . latanoprost (XALATAN) 0.005 % ophthalmic solution Place 1 drop into both eyes at bedtime.    Marland Kitchen levothyroxine (SYNTHROID, LEVOTHROID) 50 MCG tablet Take 1 tablet by mouth daily before breakfast.     . lisinopril (PRINIVIL,ZESTRIL) 10 MG tablet Take 10 mg by mouth daily.  1  . ranolazine (RANEXA) 1000 MG SR tablet 500 mg 2 (two) times daily.   2  . traMADol (ULTRAM) 50 MG tablet Take 1 tablet (50 mg total) by mouth every 6 (six) hours as needed for  up to 7 days for moderate pain or severe pain. 28 tablet 0  . vitamin B-12 (CYANOCOBALAMIN) 1000 MCG tablet Take 1,000 mcg by mouth daily.    Marland Kitchen Spacer/Aero Chamber Mouthpiece MISC 1 Units by Does not apply route every 4 (four) hours as needed (wheezing). (Patient not taking: Reported on 02/20/2018) 1 each 0   No current facility-administered medications for this visit.         Physical Exam BP (!) 148/57   Pulse (!) 55   Resp 14   Ht 5\' 6"  (1.676 m)   Wt 158 lb (71.7 kg)   BMI 25.50 kg/m  Gen:  WD/WN, NAD Skin: incision C/D/I.  Some mild residual bruising is present as well as some swelling at the incision site but no erythema or drainage Neuro: no focal deficits appreciated     Assessment/Plan:  Essential hypertension blood pressure control important in  reducing the progression of atherosclerotic disease. On appropriate oral medications.   Bilateral carotid artery stenosis Doing well after right carotid endarterectomy.  May resume all normal activities.  No restrictions at this point.  Continue current medical regimen.  Return to clinic in 3 months with follow-up with carotid duplex.      Leotis Pain 02/20/2018, 4:33 PM   This note was created with Dragon medical transcription system.  Any errors from dictation are unintentional.

## 2018-02-20 NOTE — Assessment & Plan Note (Signed)
Doing well after right carotid endarterectomy.  May resume all normal activities.  No restrictions at this point.  Continue current medical regimen.  Return to clinic in 3 months with follow-up with carotid duplex.

## 2018-02-20 NOTE — Assessment & Plan Note (Signed)
blood pressure control important in reducing the progression of atherosclerotic disease. On appropriate oral medications.  

## 2018-02-23 ENCOUNTER — Ambulatory Visit: Payer: PPO

## 2018-02-26 ENCOUNTER — Other Ambulatory Visit: Payer: Self-pay | Admitting: Pharmacist

## 2018-02-26 NOTE — Patient Outreach (Signed)
[place] [facility]) Care Management  02/26/2018  DONTERRIUS SANTUCCI [birth year] [id number]  Contacted patient for 30 day post discharge medication review. Left HIPAA compliant message for patient to return my call at his convenience.   Plan - Will f/u in 2-3 business days if I have not heard back.    Catie Darnelle Maffucci, PharmD PGY2 Ambulatory Care Pharmacy Resident, [place] [facility] Phone: [phone]

## 2018-02-27 ENCOUNTER — Other Ambulatory Visit: Payer: Self-pay | Admitting: Pharmacist

## 2018-02-27 NOTE — Patient Outreach (Signed)
[place] [facility]) Care Management  [place]   02/27/2018  Jason House Oct 23, 1940 [id number]  Reason for referral: Medication Reconciliation Post Discharge  Current insurance:[employer]  PMHx includes but not limited to: bilateral carotid artery stenosis, atherosclerotic heart disease, HTN, HLD, CKD  Outreach:  Successful telephone call with patient and his wife.  HIPAA identifiers verified.   Subjective:  Patient was recently admitted to [facility] 12/12-12/13/2019 for right endarterectomy. He notes that he is healing well; swelling and bruising have mostly resolved, and he is no longer sore or uncomfortable. He endorses developing a cold in the past few days, but overall is pleased with his healing. His wife helped with a medication review by reading his medication bottles.   He denies checking blood pressure at home since his meter "ran out on him" a while ago.     Objective: Lab Results  Component Value Date   CREATININE 1.25 (H) 02/13/2018   CREATININE 1.44 (H) 05/20/2017   CREATININE 1.32 (H) 05/15/2017  Scr 1.3 (02/18/2018); eGFR 53 (Care Everywhere)  Lipid Panel  LDL 43; TC 103 (01/26/2018)  BP Readings from Last 3 Encounters:  02/20/18 (!) 148/57  02/13/18 (!) 122/104  02/06/18 (!) 131/40    No Known Allergies  Medications Reviewed Today    Reviewed by De Hollingshead, Conrad (Pharmacist) on 02/27/18 at 1041  Med List Status: <None>  Medication Order Taking? Sig Documenting Provider Last Dose Status Informant  Albuterol Sulfate (PROAIR RESPICLICK) 242 (90 Base) MCG/ACT AEPB [id number] Yes Inhale 1 puff into the lungs every 4 (four) hours as needed (shortness of breath). Darel Hong, MD Taking Active Self           Med Note Darnelle Maffucci, Arville Lime   Fri Feb 27, 2018 10:30 AM) ~4 times a day  aspirin EC 81 MG tablet [id number] Yes Take 81 mg by mouth daily. [provider] Taking Active Self  atorvastatin (LIPITOR) 80 MG  tablet [id number] Yes Take 80 mg by mouth daily. [provider] Taking Active Self  benzonatate (TESSALON) 200 MG capsule [id number] Yes Take 200 mg by mouth 3 (three) times daily as needed for cough. [provider] Taking Active   BYSTOLIC 5 MG tablet [id number] Yes Take 5 mg by mouth daily. [provider] Taking Active   clopidogrel (PLAVIX) 75 MG tablet [id number] Yes Take 75 mg by mouth daily. [provider] Taking Active Self  cyclobenzaprine (FLEXERIL) 10 MG tablet [id number] No Take 10 mg by mouth 3 (three) times daily as needed for muscle spasms. [provider] Not Taking Active Self  dorzolamide-timolol (COSOPT) 22.3-6.8 MG/ML ophthalmic solution [id number] Yes Place 1 drop into both eyes 2 (two) times daily. [provider] Taking Active Self  guaiFENesin (MUCINEX) 600 MG 12 hr tablet [id number] Yes Take 1,200 mg by mouth 2 (two) times daily. [provider] Taking Active   hydrochlorothiazide (HYDRODIURIL) 25 MG tablet [id number] Yes Take 12.5 mg by mouth every evening. Takes 1/2 of a 25 mg tab [provider] Taking Active Self  latanoprost (XALATAN) 0.005 % ophthalmic solution [id number] Yes Place 1 drop into both eyes at bedtime. [provider] Taking Active Self  levothyroxine (SYNTHROID, LEVOTHROID) 50 MCG tablet [id number] Yes Take 1 tablet by mouth daily before breakfast.  [provider] Taking Active Self  lisinopril (PRINIVIL,ZESTRIL) 10 MG tablet [id number] Yes Take 10 mg by mouth daily. [provider] Taking Active   ranolazine (RANEXA) 500 MG 12 hr  tablet [id number] Yes 500 mg 2 (two) times daily.  [provider] Taking Active   Spacer/Aero Chamber Mouthpiece [place] [id number] No 1 Units by Does not apply route every 4 (four) hours as needed (wheezing).  Patient not taking:  Reported on 02/20/2018   Darel Hong, MD Not Taking Active Self  vitamin B-12 (CYANOCOBALAMIN) 1000  MCG tablet [id number] Yes Take 1,000 mcg by mouth daily. [provider] Taking Active Self          Assessment:  Date Discharged from Hospital: 02/13/2018 Date Medication Reconciliation Performed: 02/27/2018  Medications:  New at Discharge: . Tramadol (completed)  Patient was recently discharged from hospital and all medications have been reviewed.  Drugs sorted by system:  Cardiovascular: aspirin, atorvastatin, Bystolic, clopidogrel, HCTZ, lisinopril, ranolazine  Pulmonary/Allergy: albuterol inhaler, benzonatate, guaifenesin  Endocrine: levothyroxine  Topical: lantanoprost ophthalmic, dorzolamide-timolol ophthalmic   Pain: cyclobenzaprine (has not needed recently)  Vitamins/Minerals/Supplements: vitamin B12  Medication Review Findings:  . Antihypertensives: recommended patient check BP at pharmacies/grocery stores while he is out to monitor effect of antihypertensive medications . Medication Affordability: patient notes that ranolazine is a $45 copay. We discussed that it recently went generic, and that it takes time for the pricing to reflect a change to generic status.  . We discussed that guaifenesin is most effective with plenty of water; recommended that he remain well hydrated during his cold.   Plan: - Will route note to PCP for FYI - Patient has my contact information for any future questions or concerns  Catie Darnelle Maffucci, PharmD PGY2 Ambulatory Care Pharmacy Resident, [place] Phone: [phone]

## 2018-03-03 DIAGNOSIS — I2510 Atherosclerotic heart disease of native coronary artery without angina pectoris: Secondary | ICD-10-CM | POA: Diagnosis not present

## 2018-03-03 DIAGNOSIS — E782 Mixed hyperlipidemia: Secondary | ICD-10-CM | POA: Diagnosis not present

## 2018-03-03 DIAGNOSIS — I25810 Atherosclerosis of coronary artery bypass graft(s) without angina pectoris: Secondary | ICD-10-CM | POA: Diagnosis not present

## 2018-03-03 DIAGNOSIS — R079 Chest pain, unspecified: Secondary | ICD-10-CM | POA: Diagnosis not present

## 2018-03-03 DIAGNOSIS — R05 Cough: Secondary | ICD-10-CM | POA: Diagnosis not present

## 2018-03-03 DIAGNOSIS — I340 Nonrheumatic mitral (valve) insufficiency: Secondary | ICD-10-CM | POA: Diagnosis not present

## 2018-03-03 DIAGNOSIS — I10 Essential (primary) hypertension: Secondary | ICD-10-CM | POA: Diagnosis not present

## 2018-03-11 DIAGNOSIS — R05 Cough: Secondary | ICD-10-CM | POA: Diagnosis not present

## 2018-03-18 DIAGNOSIS — D649 Anemia, unspecified: Secondary | ICD-10-CM | POA: Diagnosis not present

## 2018-03-23 DIAGNOSIS — H401121 Primary open-angle glaucoma, left eye, mild stage: Secondary | ICD-10-CM | POA: Diagnosis not present

## 2018-03-30 DIAGNOSIS — H401121 Primary open-angle glaucoma, left eye, mild stage: Secondary | ICD-10-CM | POA: Diagnosis not present

## 2018-05-22 ENCOUNTER — Ambulatory Visit (INDEPENDENT_AMBULATORY_CARE_PROVIDER_SITE_OTHER): Payer: PPO

## 2018-05-22 ENCOUNTER — Encounter (INDEPENDENT_AMBULATORY_CARE_PROVIDER_SITE_OTHER): Payer: Self-pay | Admitting: Nurse Practitioner

## 2018-05-22 ENCOUNTER — Ambulatory Visit (INDEPENDENT_AMBULATORY_CARE_PROVIDER_SITE_OTHER): Payer: PPO | Admitting: Nurse Practitioner

## 2018-05-22 ENCOUNTER — Other Ambulatory Visit: Payer: Self-pay

## 2018-05-22 VITALS — BP 143/55 | HR 50 | Resp 16 | Ht 67.0 in | Wt 160.0 lb

## 2018-05-22 DIAGNOSIS — I6523 Occlusion and stenosis of bilateral carotid arteries: Secondary | ICD-10-CM

## 2018-05-22 DIAGNOSIS — I6521 Occlusion and stenosis of right carotid artery: Secondary | ICD-10-CM | POA: Diagnosis not present

## 2018-05-22 DIAGNOSIS — Z87891 Personal history of nicotine dependence: Secondary | ICD-10-CM | POA: Diagnosis not present

## 2018-05-22 DIAGNOSIS — Z79899 Other long term (current) drug therapy: Secondary | ICD-10-CM

## 2018-05-22 DIAGNOSIS — I10 Essential (primary) hypertension: Secondary | ICD-10-CM | POA: Diagnosis not present

## 2018-05-22 DIAGNOSIS — Z7902 Long term (current) use of antithrombotics/antiplatelets: Secondary | ICD-10-CM | POA: Diagnosis not present

## 2018-05-22 DIAGNOSIS — E785 Hyperlipidemia, unspecified: Secondary | ICD-10-CM

## 2018-05-22 DIAGNOSIS — Z7982 Long term (current) use of aspirin: Secondary | ICD-10-CM | POA: Diagnosis not present

## 2018-05-28 ENCOUNTER — Encounter (INDEPENDENT_AMBULATORY_CARE_PROVIDER_SITE_OTHER): Payer: Self-pay | Admitting: Nurse Practitioner

## 2018-05-28 NOTE — Progress Notes (Signed)
SUBJECTIVE:  Patient ID: Jason House, male    DOB: 01-25-1941, 78 y.o.   MRN: [id number] Chief Complaint  Patient presents with  . Follow-up    45month ultrasound     HPI  Jason House is a 78 y.o. male The patient is seen for follow up evaluation of carotid stenosis status post right carotid endarterectomy on 02/12/2018.  There were no post operative problems or complications related to the surgery.  The patient denies neck or incisional pain.  The patient denies interval amaurosis fugax. There is no recent history of TIA symptoms or focal motor deficits. There is no prior documented CVA.  The patient denies headache.  The patient is taking enteric-coated aspirin 81 mg daily.  The patient has a history of coronary artery disease, no recent episodes of angina or shortness of breath. The patient denies PAD or claudication symptoms. There is a history of hyperlipidemia which is being treated with a statin.   Carotid duplex today shows a widely patent right internal carotid artery status post endarterectomy.  The left ICA currently has 1 to 39% stenosis.  Past Medical History:  Diagnosis Date  . Anemia   . COPD (chronic obstructive pulmonary disease) ([place])   . Coronary artery disease   . Glaucoma   . H/O ETOH abuse   . History of shingles   . Hyperlipidemia   . Hypertension   . Hypothyroidism     Past Surgical History:  Procedure Laterality Date  . COLONOSCOPY WITH PROPOFOL N/A 09/23/2014   Procedure: COLONOSCOPY WITH PROPOFOL;  Surgeon: Josefine Class, MD;  Location: [facility] ENDOSCOPY;  Service: Endoscopy;  Laterality: N/A;  . CORONARY ANGIOPLASTY    . CORONARY ARTERY BYPASS GRAFT    . ENDARTERECTOMY Right 02/12/2018   Procedure: ENDARTERECTOMY CAROTID;  Surgeon: Algernon Huxley, MD;  Location: [facility] ORS;  Service: Vascular;  Laterality: Right;  . ERCP N/A 04/22/2017   Procedure: ENDOSCOPIC RETROGRADE CHOLANGIOPANCREATOGRAPHY (ERCP);  Surgeon: Lucilla Lame, MD;   Location: [facility] ENDOSCOPY;  Service: Endoscopy;  Laterality: N/A;  . EYE SURGERY Bilateral    cataract    Social History   Socioeconomic History  . Marital status: Married    Spouse name: Not on file  . Number of children: Not on file  . Years of education: Not on file  . Highest education level: Not on file  Occupational History  . Not on file  Social Needs  . Financial resource strain: Not on file  . Food insecurity:    Worry: Not on file    Inability: Not on file  . Transportation needs:    Medical: Not on file    Non-medical: Not on file  Tobacco Use  . Smoking status: Former Research scientist (life sciences)  . Smokeless tobacco: Never Used  . Tobacco comment: 30 years ago  Substance and Sexual Activity  . Alcohol use: No    Frequency: Never    Comment: stopped 2010  . Drug use: No  . Sexual activity: Not on file  Lifestyle  . Physical activity:    Days per week: Not on file    Minutes per session: Not on file  . Stress: Not on file  Relationships  . Social connections:    Talks on phone: Not on file    Gets together: Not on file    Attends religious service: Not on file    Active member of club or organization: Not on file    Attends meetings of clubs or  organizations: Not on file    Relationship status: Not on file  . Intimate partner violence:    Fear of current or ex partner: Not on file    Emotionally abused: Not on file    Physically abused: Not on file    Forced sexual activity: Not on file  Other Topics Concern  . Not on file  Social History Narrative  . Not on file    Family History  Problem Relation Age of Onset  . Diabetes Mother   . Cancer Sister   . Throat cancer Brother   . Cirrhosis Brother   . COPD Sister   . COPD Sister   . Diabetes Sister     No Known Allergies   Review of Systems   Review of Systems: Negative Unless Checked Constitutional: [] Weight loss  [] Fever  [] Chills Cardiac: [] Chest pain   []  Atrial Fibrillation  [] Palpitations    [] Shortness of breath when laying flat   [] Shortness of breath with exertion. [] Shortness of breath at rest Vascular:  [] Pain in legs with walking   [] Pain in legs with standing [] Pain in legs when laying flat   [] Claudication    [] Pain in feet when laying flat    [] History of DVT   [] Phlebitis   [] Swelling in legs   [] Varicose veins   [] Non-healing ulcers Pulmonary:   [] Uses home oxygen   [] Productive cough   [] Hemoptysis   [] Wheeze  [x] COPD   [] Asthma Neurologic:  [] Dizziness   [] Seizures  [] Blackouts [] History of stroke   [] History of TIA  [] Aphasia   [] Temporary Blindness   [] Weakness or numbness in arm   [] Weakness or numbness in leg Musculoskeletal:   [] Joint swelling   [] Joint pain   [] Low back pain  []  History of Knee Replacement [] Arthritis [] back Surgeries  []  Spinal Stenosis    Hematologic:  [] Easy bruising  [] Easy bleeding   [] Hypercoagulable state   [x] Anemic Gastrointestinal:  [] Diarrhea   [] Vomiting  [] Gastroesophageal reflux/heartburn   [] Difficulty swallowing. [] Abdominal pain Genitourinary:  [x] Chronic kidney disease   [] Difficult urination  [] Anuric   [] Blood in urine [] Frequent urination  [] Burning with urination   [] Hematuria Skin:  [] Rashes   [] Ulcers [] Wounds Psychological:  [] History of anxiety   []  History of major depression  []  Memory Difficulties      OBJECTIVE:   Physical Exam  BP (!) 143/55 (BP Location: Right Arm)   Pulse (!) 50   Resp 16   Ht 5\' 7"  (1.702 m)   Wt 160 lb (72.6 kg)   BMI 25.06 kg/m   Gen: WD/WN, NAD Head: [place]/AT, No temporalis wasting.  Ear/Nose/Throat: Hearing grossly intact, nares w/o erythema or drainage Eyes: PER, EOMI, sclera nonicteric.  Neck: Supple, no masses.  No JVD.  Pulmonary:  Good air movement, no use of accessory muscles.  Cardiac: RRR Vascular:  Vessel Right Left  Radial Palpable Palpable  Brachial Palpable Palpable  Femoral Palpable Palpable  Popliteal Palpable Palpable  Dorsalis Pedis Palpable Palpable  Posterior  Tibial Palpable Palpable   Gastrointestinal: soft, non-distended. No guarding/no peritoneal signs.  Musculoskeletal: M/S 5/5 throughout.  No deformity or atrophy.  Neurologic: Pain and light touch intact in extremities.  Symmetrical.  Speech is fluent. Motor exam as listed above. Psychiatric: Judgment intact, Mood & affect appropriate for pt's clinical situation. Dermatologic: No Venous rashes. No Ulcers Noted.  No changes consistent with cellulitis. Lymph : No Cervical lymphadenopathy, no lichenification or skin changes of chronic lymphedema.  ASSESSMENT AND PLAN:  1. Carotid stenosis, asymptomatic, right Recommend:  The patient is s/p successful right CEA  Duplex ultrasound preoperatively shows 1 to 39% contralateral stenosis.  Continue antiplatelet therapy as prescribed Continue management of CAD, HTN and Hyperlipidemia Healthy heart diet,  encouraged exercise at least 4 times per week  Follow up in 6 months with duplex ultrasound and physical exam based on the patient's carotid surgery    2. Essential hypertension Continue antihypertensive medications as already ordered, these medications have been reviewed and there are no changes at this time.   3. Hyperlipidemia, unspecified hyperlipidemia type Continue statin as ordered and reviewed, no changes at this time    Current Outpatient Medications on File Prior to Visit  Medication Sig Dispense Refill  . Albuterol Sulfate (PROAIR RESPICLICK) 037 (90 Base) MCG/ACT AEPB Inhale 1 puff into the lungs every 4 (four) hours as needed (shortness of breath). 1 each 0  . aspirin EC 81 MG tablet Take 81 mg by mouth daily.    Marland Kitchen atorvastatin (LIPITOR) 80 MG tablet Take 80 mg by mouth daily.    Marland Kitchen azelastine (ASTELIN) 0.1 % nasal spray USE 1 SPRAY(S) IN EACH NOSTRIL TWICE DAILY AS NEEDED FOR RUNNY NOSE    . benzonatate (TESSALON) 200 MG capsule Take 200 mg by mouth 3 (three) times daily as needed for cough.    . BYSTOLIC 5 MG tablet  Take 5 mg by mouth daily.  1  . clopidogrel (PLAVIX) 75 MG tablet Take 75 mg by mouth daily.    . cyclobenzaprine (FLEXERIL) 10 MG tablet Take 10 mg by mouth 3 (three) times daily as needed for muscle spasms.    . dorzolamide-timolol (COSOPT) 22.3-6.8 MG/ML ophthalmic solution Place 1 drop into both eyes 2 (two) times daily.    Marland Kitchen guaiFENesin (MUCINEX) 600 MG 12 hr tablet Take 1,200 mg by mouth 2 (two) times daily.    . hydrochlorothiazide (HYDRODIURIL) 25 MG tablet Take 12.5 mg by mouth every evening. Takes 1/2 of a 25 mg tab    . latanoprost (XALATAN) 0.005 % ophthalmic solution Place 1 drop into both eyes at bedtime.    Marland Kitchen levothyroxine (SYNTHROID, LEVOTHROID) 50 MCG tablet Take 1 tablet by mouth daily before breakfast.     . lisinopril (PRINIVIL,ZESTRIL) 10 MG tablet Take 10 mg by mouth daily.  1  . ranolazine (RANEXA) 500 MG 12 hr tablet 500 mg 2 (two) times daily.   2  . vitamin B-12 (CYANOCOBALAMIN) 1000 MCG tablet Take 1,000 mcg by mouth daily.    Marland Kitchen Spacer/Aero Chamber Mouthpiece MISC 1 Units by Does not apply route every 4 (four) hours as needed (wheezing). (Patient not taking: Reported on 02/20/2018) 1 each 0   No current facility-administered medications on file prior to visit.     There are no Patient Instructions on file for this visit. No follow-ups on file.   Kris Hartmann, NP  This note was completed with Sales executive.  Any errors are purely unintentional.

## 2018-06-02 DIAGNOSIS — I10 Essential (primary) hypertension: Secondary | ICD-10-CM | POA: Diagnosis not present

## 2018-06-02 DIAGNOSIS — R0602 Shortness of breath: Secondary | ICD-10-CM | POA: Diagnosis not present

## 2018-06-02 DIAGNOSIS — R079 Chest pain, unspecified: Secondary | ICD-10-CM | POA: Diagnosis not present

## 2018-06-02 DIAGNOSIS — I25810 Atherosclerosis of coronary artery bypass graft(s) without angina pectoris: Secondary | ICD-10-CM | POA: Diagnosis not present

## 2018-06-02 DIAGNOSIS — I7389 Other specified peripheral vascular diseases: Secondary | ICD-10-CM | POA: Diagnosis not present

## 2018-06-02 DIAGNOSIS — I2510 Atherosclerotic heart disease of native coronary artery without angina pectoris: Secondary | ICD-10-CM | POA: Diagnosis not present

## 2018-06-02 DIAGNOSIS — I340 Nonrheumatic mitral (valve) insufficiency: Secondary | ICD-10-CM | POA: Diagnosis not present

## 2018-06-02 DIAGNOSIS — E782 Mixed hyperlipidemia: Secondary | ICD-10-CM | POA: Diagnosis not present

## 2018-06-08 DIAGNOSIS — M353 Polymyalgia rheumatica: Secondary | ICD-10-CM | POA: Diagnosis not present

## 2018-06-08 DIAGNOSIS — J189 Pneumonia, unspecified organism: Secondary | ICD-10-CM | POA: Diagnosis not present

## 2018-06-08 DIAGNOSIS — I110 Hypertensive heart disease with heart failure: Secondary | ICD-10-CM | POA: Diagnosis not present

## 2018-06-08 DIAGNOSIS — R079 Chest pain, unspecified: Secondary | ICD-10-CM | POA: Diagnosis not present

## 2018-06-08 DIAGNOSIS — Z7952 Long term (current) use of systemic steroids: Secondary | ICD-10-CM | POA: Diagnosis not present

## 2018-06-08 DIAGNOSIS — I509 Heart failure, unspecified: Secondary | ICD-10-CM | POA: Diagnosis not present

## 2018-06-08 DIAGNOSIS — J441 Chronic obstructive pulmonary disease with (acute) exacerbation: Secondary | ICD-10-CM | POA: Diagnosis not present

## 2018-06-15 DIAGNOSIS — I10 Essential (primary) hypertension: Secondary | ICD-10-CM | POA: Diagnosis not present

## 2018-06-15 DIAGNOSIS — J449 Chronic obstructive pulmonary disease, unspecified: Secondary | ICD-10-CM | POA: Diagnosis not present

## 2018-06-15 DIAGNOSIS — I2510 Atherosclerotic heart disease of native coronary artery without angina pectoris: Secondary | ICD-10-CM | POA: Diagnosis not present

## 2018-06-15 DIAGNOSIS — Z794 Long term (current) use of insulin: Secondary | ICD-10-CM | POA: Diagnosis not present

## 2018-06-15 DIAGNOSIS — E1169 Type 2 diabetes mellitus with other specified complication: Secondary | ICD-10-CM | POA: Diagnosis not present

## 2018-06-15 DIAGNOSIS — D696 Thrombocytopenia, unspecified: Secondary | ICD-10-CM | POA: Diagnosis not present

## 2018-06-15 DIAGNOSIS — N186 End stage renal disease: Secondary | ICD-10-CM | POA: Diagnosis not present

## 2018-06-15 DIAGNOSIS — E785 Hyperlipidemia, unspecified: Secondary | ICD-10-CM | POA: Diagnosis not present

## 2018-06-15 DIAGNOSIS — I340 Nonrheumatic mitral (valve) insufficiency: Secondary | ICD-10-CM | POA: Diagnosis not present

## 2018-06-15 DIAGNOSIS — E875 Hyperkalemia: Secondary | ICD-10-CM | POA: Diagnosis not present

## 2018-06-15 DIAGNOSIS — I255 Ischemic cardiomyopathy: Secondary | ICD-10-CM | POA: Diagnosis not present

## 2018-06-15 DIAGNOSIS — I25119 Atherosclerotic heart disease of native coronary artery with unspecified angina pectoris: Secondary | ICD-10-CM | POA: Diagnosis not present

## 2018-06-15 DIAGNOSIS — I5022 Chronic systolic (congestive) heart failure: Secondary | ICD-10-CM | POA: Diagnosis not present

## 2018-06-15 DIAGNOSIS — E1122 Type 2 diabetes mellitus with diabetic chronic kidney disease: Secondary | ICD-10-CM | POA: Diagnosis not present

## 2018-06-15 DIAGNOSIS — Z992 Dependence on renal dialysis: Secondary | ICD-10-CM | POA: Diagnosis not present

## 2018-07-28 DIAGNOSIS — I739 Peripheral vascular disease, unspecified: Secondary | ICD-10-CM | POA: Diagnosis not present

## 2018-07-28 DIAGNOSIS — R7309 Other abnormal glucose: Secondary | ICD-10-CM | POA: Diagnosis not present

## 2018-07-28 DIAGNOSIS — Z79899 Other long term (current) drug therapy: Secondary | ICD-10-CM | POA: Diagnosis not present

## 2018-07-28 DIAGNOSIS — Z125 Encounter for screening for malignant neoplasm of prostate: Secondary | ICD-10-CM | POA: Diagnosis not present

## 2018-07-28 DIAGNOSIS — E782 Mixed hyperlipidemia: Secondary | ICD-10-CM | POA: Diagnosis not present

## 2018-07-28 DIAGNOSIS — I2510 Atherosclerotic heart disease of native coronary artery without angina pectoris: Secondary | ICD-10-CM | POA: Diagnosis not present

## 2018-07-28 DIAGNOSIS — Z0000 Encounter for general adult medical examination without abnormal findings: Secondary | ICD-10-CM | POA: Diagnosis not present

## 2018-07-28 DIAGNOSIS — E039 Hypothyroidism, unspecified: Secondary | ICD-10-CM | POA: Diagnosis not present

## 2018-07-28 DIAGNOSIS — I10 Essential (primary) hypertension: Secondary | ICD-10-CM | POA: Diagnosis not present

## 2018-09-25 ENCOUNTER — Ambulatory Visit: Payer: PPO | Admitting: Surgery

## 2018-09-29 DIAGNOSIS — H401121 Primary open-angle glaucoma, left eye, mild stage: Secondary | ICD-10-CM | POA: Diagnosis not present

## 2018-10-06 ENCOUNTER — Ambulatory Visit: Payer: PPO | Admitting: Surgery

## 2018-10-07 ENCOUNTER — Other Ambulatory Visit: Payer: Self-pay

## 2018-10-07 ENCOUNTER — Ambulatory Visit: Payer: PPO | Admitting: Surgery

## 2018-10-07 ENCOUNTER — Encounter: Payer: Self-pay | Admitting: Surgery

## 2018-10-07 VITALS — BP 162/72 | HR 59 | Temp 97.5°F | Ht 68.0 in | Wt 160.0 lb

## 2018-10-07 DIAGNOSIS — K851 Biliary acute pancreatitis without necrosis or infection: Secondary | ICD-10-CM

## 2018-10-07 NOTE — Patient Instructions (Signed)
Return in thee months. The patient is aware to call back for any questions or concerns.

## 2018-10-07 NOTE — Progress Notes (Signed)
10/07/2018  History of Present Illness: Jason House is a 78 y.o. male with a history of gallstone pancreatitis in 03/2017.  He had an outpatient ERCP with Dr. Allen Norris during that admission and subsequently for stent removal in 04/2017.  He was supposed to have surgery with [place] in 05/2017 but had COPD exacerbation and canceled his surgery.  However, he did not follow up afterwards.  He did have a right carotid endarterectomy in 02/2018 and has been doing well post-operatively.  He reports that since his admission in 03/2017 he has only had one brief episode of RUQ discomfort.  Denies any fevers, chills, chest pain, shortness of breath, nausea, vomiting.  Past Medical History: Past Medical History:  Diagnosis Date  . Anemia   . COPD (chronic obstructive pulmonary disease) ([place])   . Coronary artery disease   . Glaucoma   . H/O ETOH abuse   . History of shingles   . Hyperlipidemia   . Hypertension   . Hypothyroidism      Past Surgical History: Past Surgical History:  Procedure Laterality Date  . COLONOSCOPY WITH PROPOFOL N/A 09/23/2014   Procedure: COLONOSCOPY WITH PROPOFOL;  Surgeon: Josefine Class, MD;  Location: [facility] ENDOSCOPY;  Service: Endoscopy;  Laterality: N/A;  . CORONARY ANGIOPLASTY    . CORONARY ARTERY BYPASS GRAFT    . ENDARTERECTOMY Right 02/12/2018   Procedure: ENDARTERECTOMY CAROTID;  Surgeon: Algernon Huxley, MD;  Location: [facility] ORS;  Service: Vascular;  Laterality: Right;  . ERCP N/A 04/22/2017   Procedure: ENDOSCOPIC RETROGRADE CHOLANGIOPANCREATOGRAPHY (ERCP);  Surgeon: Lucilla Lame, MD;  Location: [facility] ENDOSCOPY;  Service: Endoscopy;  Laterality: N/A;  . EYE SURGERY Bilateral    cataract    Home Medications: Prior to Admission medications   Medication Sig Start Date End Date Taking? Authorizing Provider  Albuterol Sulfate (PROAIR RESPICLICK) 790 (90 Base) MCG/ACT AEPB Inhale 1 puff into the lungs every 4 (four) hours as needed (shortness of breath). 05/15/17  Yes  Darel Hong, MD  aspirin EC 81 MG tablet Take 81 mg by mouth daily.   Yes [provider]  atorvastatin (LIPITOR) 80 MG tablet Take 80 mg by mouth daily.   Yes [provider]  azelastine (ASTELIN) 0.1 % nasal spray USE 1 SPRAY(S) IN EACH NOSTRIL TWICE DAILY AS NEEDED FOR RUNNY NOSE 02/23/18  Yes [provider]  benzonatate (TESSALON) 200 MG capsule Take 200 mg by mouth 3 (three) times daily as needed for cough.   Yes [provider]  BYSTOLIC 5 MG tablet Take 5 mg by mouth daily. 12/19/17  Yes [provider]  clopidogrel (PLAVIX) 75 MG tablet Take 75 mg by mouth daily.   Yes [provider]  cyclobenzaprine (FLEXERIL) 10 MG tablet Take 10 mg by mouth 3 (three) times daily as needed for muscle spasms.   Yes [provider]  dorzolamide-timolol (COSOPT) 22.3-6.8 MG/ML ophthalmic solution Place 1 drop into both eyes 2 (two) times daily.   Yes [provider]  guaiFENesin (MUCINEX) 600 MG 12 hr tablet Take 1,200 mg by mouth 2 (two) times daily.   Yes [provider]  hydrochlorothiazide (HYDRODIURIL) 25 MG tablet Take 12.5 mg by mouth every evening. Takes 1/2 of a 25 mg tab   Yes [provider]  latanoprost (XALATAN) 0.005 % ophthalmic solution Place 1 drop into both eyes at bedtime.   Yes [provider]  levothyroxine (SYNTHROID, LEVOTHROID) 50 MCG tablet Take 1 tablet by mouth daily before breakfast.  01/04/17  Yes [provider]  lisinopril (PRINIVIL,ZESTRIL) 10 MG tablet Take 10 mg by mouth daily. 12/20/17  Yes [provider]  ranolazine (RANEXA) 500 MG 12 hr tablet 500 mg 2 (two) times daily.  12/30/17  Yes [provider]  Spacer/Aero Chamber Mouthpiece MISC 1 Units by Does not apply route every 4 (four) hours as needed (wheezing). 05/15/17  Yes Darel Hong, MD  vitamin B-12 (CYANOCOBALAMIN) 1000 MCG tablet Take 1,000 mcg by mouth daily.   Yes [provider]    Allergies: No Known Allergies  Review of Systems: Review of Systems  Constitutional: Negative for chills and fever.  Respiratory: Negative for shortness of breath.   Cardiovascular: Negative for chest pain.  Gastrointestinal: Negative for abdominal pain, nausea and vomiting.    Physical Exam BP (!) 162/72   Pulse (!) 59   Temp (!) 97.5 F (36.4 C) (Skin)   Ht 5\' 8"  (1.727 m)   Wt 160 lb (72.6 kg)   SpO2 96%   BMI 24.33 kg/m  CONSTITUTIONAL: No acute distress HEENT:  Normocephalic, atraumatic, extraocular motion intact. RESPIRATORY:  Lungs are clear, and breath sounds are equal bilaterally. Normal respiratory effort without pathologic use of accessory muscles. CARDIOVASCULAR: Heart is regular without murmurs, gallops, or rubs. GI: The abdomen is soft, non-distended, non-tender to palpation.  Negative Murphy's sign.  NEUROLOGIC:  Motor and sensation is grossly normal.  Cranial nerves are grossly intact. PSYCH:  Alert and oriented to person, place and time. Affect is normal.  Labs/Imaging: None recently  Assessment and Plan: This is a 78 y.o. male with hx of gallstone pancreatitis.  Discussed with the patient that although he has not had surgery, the recommendation is that he undergo cholecystectomy in order to prevent another episode of gallstone pancreatitis.  Discussed with the patient that pancreatitis could potentially be very severe and cause significant comorbidities.  Discussed with him the role of laparoscopic cholecystectomy and that I would also like to perform an intraoperative cholangiogram.  He agrees to have surgery but would like to wait until November.  He will follow up late October so we can discuss surgery further and update H&P.  We would need clearance from both vascular surgery and cardiology prior to surgery and stop his Plavix and Aspirin prior to surgery.  Face-to-face time spent with the patient and care providers was 25 minutes, with  more than 50% of the time spent counseling, educating, and coordinating care of the patient.     Melvyn Neth, [facility]

## 2018-10-14 DIAGNOSIS — I10 Essential (primary) hypertension: Secondary | ICD-10-CM | POA: Diagnosis not present

## 2018-10-14 DIAGNOSIS — E039 Hypothyroidism, unspecified: Secondary | ICD-10-CM | POA: Diagnosis not present

## 2018-10-19 DIAGNOSIS — I2510 Atherosclerotic heart disease of native coronary artery without angina pectoris: Secondary | ICD-10-CM | POA: Diagnosis not present

## 2018-10-19 DIAGNOSIS — I340 Nonrheumatic mitral (valve) insufficiency: Secondary | ICD-10-CM | POA: Diagnosis not present

## 2018-10-19 DIAGNOSIS — E782 Mixed hyperlipidemia: Secondary | ICD-10-CM | POA: Diagnosis not present

## 2018-11-24 ENCOUNTER — Encounter (INDEPENDENT_AMBULATORY_CARE_PROVIDER_SITE_OTHER): Payer: Self-pay

## 2018-11-24 ENCOUNTER — Other Ambulatory Visit: Payer: Self-pay

## 2018-11-24 ENCOUNTER — Ambulatory Visit (INDEPENDENT_AMBULATORY_CARE_PROVIDER_SITE_OTHER): Payer: PPO

## 2018-11-24 ENCOUNTER — Encounter (INDEPENDENT_AMBULATORY_CARE_PROVIDER_SITE_OTHER): Payer: Self-pay | Admitting: Vascular Surgery

## 2018-11-24 ENCOUNTER — Ambulatory Visit (INDEPENDENT_AMBULATORY_CARE_PROVIDER_SITE_OTHER): Payer: PPO | Admitting: Vascular Surgery

## 2018-11-24 VITALS — BP 144/76 | HR 90 | Resp 10 | Ht 64.0 in | Wt 160.0 lb

## 2018-11-24 DIAGNOSIS — I6523 Occlusion and stenosis of bilateral carotid arteries: Secondary | ICD-10-CM

## 2018-11-24 DIAGNOSIS — E785 Hyperlipidemia, unspecified: Secondary | ICD-10-CM

## 2018-11-24 DIAGNOSIS — I6521 Occlusion and stenosis of right carotid artery: Secondary | ICD-10-CM | POA: Diagnosis not present

## 2018-11-24 DIAGNOSIS — I10 Essential (primary) hypertension: Secondary | ICD-10-CM | POA: Diagnosis not present

## 2018-11-24 NOTE — Assessment & Plan Note (Signed)
lipid control important in reducing the progression of atherosclerotic disease. Continue statin therapy  

## 2018-11-24 NOTE — Assessment & Plan Note (Signed)
blood pressure control important in reducing the progression of atherosclerotic disease. On appropriate oral medications.  

## 2018-11-24 NOTE — Assessment & Plan Note (Signed)
His carotid duplex today reveals a widely patent right carotid endarterectomy and mild, 1 to 39% left ICA stenosis. We will do another 13-month check to get him beyond the one-year span from his surgery.  We can then go to an annual follow-up thereafter.  Continue current medical regimen.

## 2018-11-24 NOTE — Progress Notes (Signed)
MRN : [id number]  Jason House is a 78 y.o. (1940/06/26) male who presents with chief complaint of  Chief Complaint  Patient presents with   Follow-up  .  History of Present Illness: Patient returns in follow-up of his carotid disease.  He is about 9 months status post right carotid endarterectomy for high-grade stenosis.  He is doing well today and denies any focal neurologic deficits.  He has no problems since his surgery other than some mild numbness remaining in his neck.  His carotid duplex today reveals a widely patent right carotid endarterectomy and mild, 1 to 39% left ICA stenosis.  Current Outpatient Medications  Medication Sig Dispense Refill   Albuterol Sulfate (PROAIR RESPICLICK) 703 (90 Base) MCG/ACT AEPB Inhale 1 puff into the lungs every 4 (four) hours as needed (shortness of breath). 1 each 0   aspirin EC 81 MG tablet Take 81 mg by mouth daily.     atorvastatin (LIPITOR) 80 MG tablet Take 80 mg by mouth daily.     azelastine (ASTELIN) 0.1 % nasal spray USE 1 SPRAY(S) IN EACH NOSTRIL TWICE DAILY AS NEEDED FOR RUNNY NOSE     BYSTOLIC 5 MG tablet Take 5 mg by mouth daily.  1   clopidogrel (PLAVIX) 75 MG tablet Take 75 mg by mouth daily.     cyclobenzaprine (FLEXERIL) 10 MG tablet Take 10 mg by mouth 3 (three) times daily as needed for muscle spasms.     dorzolamide-timolol (COSOPT) 22.3-6.8 MG/ML ophthalmic solution Place 1 drop into both eyes 2 (two) times daily.     guaiFENesin (MUCINEX) 600 MG 12 hr tablet Take 1,200 mg by mouth 2 (two) times daily.     hydrochlorothiazide (HYDRODIURIL) 25 MG tablet Take 12.5 mg by mouth every evening. Takes 1/2 of a 25 mg tab     latanoprost (XALATAN) 0.005 % ophthalmic solution Place 1 drop into both eyes at bedtime.     levothyroxine (SYNTHROID, LEVOTHROID) 50 MCG tablet Take 1 tablet by mouth daily before breakfast.      lisinopril (PRINIVIL,ZESTRIL) 10 MG tablet Take 10 mg by mouth daily.  1   ranolazine (RANEXA)  500 MG 12 hr tablet 500 mg 2 (two) times daily.   2   vitamin B-12 (CYANOCOBALAMIN) 1000 MCG tablet Take 1,000 mcg by mouth daily.     Spacer/Aero Chamber Mouthpiece MISC 1 Units by Does not apply route every 4 (four) hours as needed (wheezing). 1 each 0   No current facility-administered medications for this visit.     Past Medical History:  Diagnosis Date   Anemia    COPD (chronic obstructive pulmonary disease) (H[facility])    Coronary artery disease    Glaucoma    H/O ETOH abuse    History of shingles    Hyperlipidemia    Hypertension    Hypothyroidism     Past Surgical History:  Procedure Laterality Date   COLONOSCOPY WITH PROPOFOL N/A 09/23/2014   Procedure: COLONOSCOPY WITH PROPOFOL;  Surgeon: Josefine Class, MD;  Location: [facility] ENDOSCOPY;  Service: Endoscopy;  Laterality: N/A;   CORONARY ANGIOPLASTY     CORONARY ARTERY BYPASS GRAFT     ENDARTERECTOMY Right 02/12/2018   Procedure: ENDARTERECTOMY CAROTID;  Surgeon: Algernon Huxley, MD;  Location: [facility] ORS;  Service: Vascular;  Laterality: Right;   ERCP N/A 04/22/2017   Procedure: ENDOSCOPIC RETROGRADE CHOLANGIOPANCREATOGRAPHY (ERCP);  Surgeon: Lucilla Lame, MD;  Location: [facility] ENDOSCOPY;  Service: Endoscopy;  Laterality: N/A;   EYE  SURGERY Bilateral    cataract    Social History Social History   Tobacco Use   Smoking status: Former Smoker   Smokeless tobacco: Never Used   Tobacco comment: 30 years ago  Substance Use Topics   Alcohol use: No    Frequency: Never    Comment: stopped 2010   Drug use: No     Family History Family History  Problem Relation Age of Onset   Diabetes Mother    Cancer Sister    Throat cancer Brother    Cirrhosis Brother    COPD Sister    COPD Sister    Diabetes Sister      No Known Allergies   REVIEW OF SYSTEMS (Negative unless checked)  Constitutional: [] Weight loss  [] Fever  [] Chills Cardiac: [] Chest pain   [] Chest pressure   [] Palpitations    [] Shortness of breath when laying flat   [] Shortness of breath at rest   [] Shortness of breath with exertion. Vascular:  [] Pain in legs with walking   [] Pain in legs at rest   [] Pain in legs when laying flat   [] Claudication   [] Pain in feet when walking  [] Pain in feet at rest  [] Pain in feet when laying flat   [] History of DVT   [] Phlebitis   [] Swelling in legs   [] Varicose veins   [] Non-healing ulcers Pulmonary:   [] Uses home oxygen   [] Productive cough   [] Hemoptysis   [] Wheeze  [x] COPD   [] Asthma Neurologic:  [] Dizziness  [] Blackouts   [] Seizures   [] History of stroke   [] History of TIA  [] Aphasia   [] Temporary blindness   [] Dysphagia   [] Weakness or numbness in arms   [] Weakness or numbness in legs Musculoskeletal:  [x] Arthritis   [] Joint swelling   [] Joint pain   [] Low back pain Hematologic:  [] Easy bruising  [] Easy bleeding   [] Hypercoagulable state   [x] Anemic  [] Hepatitis Gastrointestinal:  [] Blood in stool   [] Vomiting blood  [x] Gastroesophageal reflux/heartburn   [] Difficulty swallowing. Genitourinary:  [] Chronic kidney disease   [] Difficult urination  [] Frequent urination  [] Burning with urination   [] Blood in urine Skin:  [] Rashes   [] Ulcers   [] Wounds Psychological:  [] History of anxiety   []  History of major depression.  Physical Examination  Vitals:   11/24/18 1020  BP: (!) 144/76  Pulse: 90  Resp: 10  Weight: 160 lb (72.6 kg)  Height: 5\' 4"  (1.626 m)   Body mass index is 27.46 kg/m. Gen:  WD/WN, NAD Head: [place]/AT, No temporalis wasting. Ear/Nose/Throat: Hearing grossly intact, nares w/o erythema or drainage, trachea midline Eyes: Conjunctiva clear. Sclera non-icteric Neck: Supple.  No bruit  Pulmonary:  Good air movement, equal and clear to auscultation bilaterally.  Cardiac: RRR, No JVD Vascular:  Vessel Right Left  Radial Palpable Palpable                   Musculoskeletal: M/S 5/5 throughout.  No deformity or atrophy.  Neurologic: CN 2-12 intact. Sensation  grossly intact in extremities.  Symmetrical.  Speech is fluent. Motor exam as listed above. Psychiatric: Judgment intact, Mood & affect appropriate for pt's clinical situation. Dermatologic: No rashes or ulcers noted.  Well healed right CEA incision     CBC Lab Results  Component Value Date   WBC 9.3 02/13/2018   HGB 8.4 (L) 02/13/2018   HCT 25.1 (L) 02/13/2018   MCV 96.2 02/13/2018   PLT 226 02/13/2018    BMET    Component Value Date/Time  NA 132 (L) 02/13/2018 0407   K 3.9 02/13/2018 0407   CL 106 02/13/2018 0407   CO2 21 (L) 02/13/2018 0407   GLUCOSE 146 (H) 02/13/2018 0407   BUN 20 02/13/2018 0407   CREATININE 1.25 (H) 02/13/2018 0407   CREATININE 1.29 07/05/2013 1004   CALCIUM 8.0 (L) 02/13/2018 0407   CALCIUM 9.2 07/05/2013 1004   GFRNONAA 55 (L) 02/13/2018 0407   GFRNONAA 55 (L) 07/05/2013 1004   GFRAA >60 02/13/2018 0407   GFRAA >60 07/05/2013 1004   CrCl cannot be calculated (Patient's most recent lab result is older than the maximum 21 days allowed.).  COAG Lab Results  Component Value Date   INR 1.03 02/06/2018   INR 0.98 05/20/2017    Radiology No results found.    Assessment/Plan Essential hypertension blood pressure control important in reducing the progression of atherosclerotic disease. On appropriate oral medications.   Hyperlipidemia lipid control important in reducing the progression of atherosclerotic disease. Continue statin therapy   Bilateral carotid artery stenosis His carotid duplex today reveals a widely patent right carotid endarterectomy and mild, 1 to 39% left ICA stenosis. We will do another 59-month check to get him beyond the one-year span from his surgery.  We can then go to an annual follow-up thereafter.  Continue current medical regimen.    Leotis Pain, MD  11/24/2018 11:14 AM    This note was created with Dragon medical transcription system.  Any errors from dictation are purely unintentional

## 2018-11-25 DIAGNOSIS — I10 Essential (primary) hypertension: Secondary | ICD-10-CM | POA: Diagnosis not present

## 2018-11-25 DIAGNOSIS — J439 Emphysema, unspecified: Secondary | ICD-10-CM | POA: Diagnosis not present

## 2018-11-25 DIAGNOSIS — R1310 Dysphagia, unspecified: Secondary | ICD-10-CM | POA: Diagnosis not present

## 2018-11-25 DIAGNOSIS — E782 Mixed hyperlipidemia: Secondary | ICD-10-CM | POA: Diagnosis not present

## 2018-11-25 DIAGNOSIS — I739 Peripheral vascular disease, unspecified: Secondary | ICD-10-CM | POA: Diagnosis not present

## 2018-11-25 DIAGNOSIS — Z79899 Other long term (current) drug therapy: Secondary | ICD-10-CM | POA: Diagnosis not present

## 2018-11-25 DIAGNOSIS — E039 Hypothyroidism, unspecified: Secondary | ICD-10-CM | POA: Diagnosis not present

## 2018-11-27 ENCOUNTER — Other Ambulatory Visit: Payer: Self-pay | Admitting: Internal Medicine

## 2018-11-27 DIAGNOSIS — R1310 Dysphagia, unspecified: Secondary | ICD-10-CM

## 2018-12-04 ENCOUNTER — Other Ambulatory Visit: Payer: Self-pay | Admitting: Internal Medicine

## 2018-12-04 ENCOUNTER — Ambulatory Visit
Admission: RE | Admit: 2018-12-04 | Discharge: 2018-12-04 | Disposition: A | Discharge status: Home / Self Care | Payer: PPO | Source: Ambulatory Visit | Attending: Internal Medicine | Admitting: Internal Medicine

## 2018-12-04 ENCOUNTER — Other Ambulatory Visit: Payer: Self-pay

## 2018-12-04 DIAGNOSIS — R1310 Dysphagia, unspecified: Secondary | ICD-10-CM

## 2018-12-04 DIAGNOSIS — K449 Diaphragmatic hernia without obstruction or gangrene: Secondary | ICD-10-CM | POA: Diagnosis not present

## 2018-12-04 DIAGNOSIS — K224 Dyskinesia of esophagus: Secondary | ICD-10-CM | POA: Diagnosis not present

## 2018-12-04 DIAGNOSIS — K225 Diverticulum of esophagus, acquired: Secondary | ICD-10-CM | POA: Diagnosis not present

## 2018-12-23 ENCOUNTER — Other Ambulatory Visit: Payer: Self-pay

## 2018-12-23 ENCOUNTER — Ambulatory Visit: Payer: PPO | Admitting: Surgery

## 2018-12-23 ENCOUNTER — Encounter: Payer: Self-pay | Admitting: Surgery

## 2018-12-23 VITALS — BP 138/69 | HR 54 | Temp 97.2°F | Ht 67.0 in | Wt 159.8 lb

## 2018-12-23 DIAGNOSIS — K851 Biliary acute pancreatitis without necrosis or infection: Secondary | ICD-10-CM | POA: Diagnosis not present

## 2018-12-23 NOTE — Progress Notes (Signed)
12/23/2018  History of Present Illness: Jason House is a 78 y.o. male presenting for follow-up of gallstone pancreatitis.  He was hospitalized in January 2019 and had an ERCP with stent placement initially.  After discharge she had the stent removed and completion of ERCP in February 2019.  He was initially scheduled for March 2019, but this was canceled due to COPD exacerbation.  Unfortunately he was never rescheduled.  In the interim, he had a right carotid endarterectomy with Dr. Lucky Cowboy in December 2019.  Patient reports that he has been doing well and denies any abdominal pain issues.  From time to time he will have some mild soreness but reports that it goes away quickly and denies any nausea vomiting with those episodes.  Denies any issues with jaundice.  He recently saw Dr. Lucky Cowboy in September 2020 and has been doing well at that point.  Past Medical History: Past Medical History:  Diagnosis Date  . Anemia   . COPD (chronic obstructive pulmonary disease) ([place])   . Coronary artery disease   . Glaucoma   . H/O ETOH abuse   . History of shingles   . Hyperlipidemia   . Hypertension   . Hypothyroidism      Past Surgical History: Past Surgical History:  Procedure Laterality Date  . COLONOSCOPY WITH PROPOFOL N/A 09/23/2014   Procedure: COLONOSCOPY WITH PROPOFOL;  Surgeon: Josefine Class, MD;  Location: [facility] ENDOSCOPY;  Service: Endoscopy;  Laterality: N/A;  . CORONARY ANGIOPLASTY    . CORONARY ARTERY BYPASS GRAFT    . ENDARTERECTOMY Right 02/12/2018   Procedure: ENDARTERECTOMY CAROTID;  Surgeon: Algernon Huxley, MD;  Location: [facility] ORS;  Service: Vascular;  Laterality: Right;  . ERCP N/A 04/22/2017   Procedure: ENDOSCOPIC RETROGRADE CHOLANGIOPANCREATOGRAPHY (ERCP);  Surgeon: Lucilla Lame, MD;  Location: [facility] ENDOSCOPY;  Service: Endoscopy;  Laterality: N/A;  . EYE SURGERY Bilateral    cataract    Home Medications: Prior to Admission medications   Medication Sig Start Date End  Date Taking? Authorizing Provider  Albuterol Sulfate (PROAIR RESPICLICK) 992 (90 Base) MCG/ACT AEPB Inhale 1 puff into the lungs every 4 (four) hours as needed (shortness of breath). 05/15/17  Yes Darel Hong, MD  aspirin EC 81 MG tablet Take 81 mg by mouth daily.   Yes [provider]  atorvastatin (LIPITOR) 80 MG tablet Take 80 mg by mouth daily.   Yes [provider]  azelastine (ASTELIN) 0.1 % nasal spray USE 1 SPRAY(S) IN EACH NOSTRIL TWICE DAILY AS NEEDED FOR RUNNY NOSE 02/23/18  Yes [provider]  BYSTOLIC 5 MG tablet Take 5 mg by mouth daily. 12/19/17  Yes [provider]  clopidogrel (PLAVIX) 75 MG tablet Take 75 mg by mouth daily.   Yes [provider]  cyclobenzaprine (FLEXERIL) 10 MG tablet Take 10 mg by mouth 3 (three) times daily as needed for muscle spasms.   Yes [provider]  dorzolamide-timolol (COSOPT) 22.3-6.8 MG/ML ophthalmic solution Place 1 drop into both eyes 2 (two) times daily.   Yes [provider]  guaiFENesin (MUCINEX) 600 MG 12 hr tablet Take 1,200 mg by mouth 2 (two) times daily.   Yes [provider]  hydrochlorothiazide (HYDRODIURIL) 25 MG tablet Take 12.5 mg by mouth every evening. Takes 1/2 of a 25 mg tab   Yes [provider]  latanoprost (XALATAN) 0.005 % ophthalmic solution Place 1 drop into both eyes at bedtime.   Yes [provider]  levothyroxine (SYNTHROID, [place])  50 MCG tablet Take 1 tablet by mouth daily before breakfast.  01/04/17  Yes [provider]  lisinopril (PRINIVIL,ZESTRIL) 10 MG tablet Take 10 mg by mouth daily. 12/20/17  Yes [provider]  ranolazine (RANEXA) 500 MG 12 hr tablet 500 mg 2 (two) times daily.  12/30/17  Yes [provider]  Spacer/Aero Chamber Mouthpiece MISC 1 Units by Does not apply route every 4 (four) hours as needed (wheezing). 05/15/17  Yes Darel Hong, MD  vitamin B-12 (CYANOCOBALAMIN) 1000 MCG  tablet Take 1,000 mcg by mouth daily.   Yes [provider]    Allergies: No Known Allergies  Review of Systems: Review of Systems  Constitutional: Negative for chills and fever.  Eyes: Negative for blurred vision.  Respiratory: Negative for shortness of breath.   Cardiovascular: Negative for chest pain.  Gastrointestinal: Negative for abdominal pain, nausea and vomiting.    Physical Exam BP 138/69   Pulse (!) 54   Temp (!) 97.2 F (36.2 C) (Temporal)   Ht 5\' 7"  (1.702 m)   Wt 159 lb 12.8 oz (72.5 kg)   SpO2 95%   BMI 25.03 kg/m  CONSTITUTIONAL: No acute distress HEENT:  Normocephalic, atraumatic, extraocular motion intact. RESPIRATORY:  Lungs are clear, and breath sounds are equal bilaterally. Normal respiratory effort without pathologic use of accessory muscles. CARDIOVASCULAR: Heart is regular without murmurs, gallops, or rubs. GI: The abdomen is soft, nondistended, nontender to palpation.  NEUROLOGIC:  Motor and sensation is grossly normal.  Cranial nerves are grossly intact. PSYCH:  Alert and oriented to person, place and time. Affect is normal.  Labs/Imaging: Barium swallow on 12/04/2018: IMPRESSION: 1. Mild esophageal dysmotility. 2. 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 3. Barium tablet initially entered the vallecula on initial swallow, but was cleared into the esophagus and stomach following an additional swallow. 4. Small hiatal hernia.  Assessment and Plan: This is a 79 y.o. male with a history of gallstone pancreatitis in January 2019, now presenting for scheduling surgery for cholecystectomy.  -Discussed with the patient that we can schedule him for a laparoscopic cholecystectomy with intraoperative cholangiogram.  Discussed with him the risks of bleeding, infection, injury to surrounding structures, possibility of open procedure.  He is willing to proceed. -Patient would like to reschedule for sometime in November.  His wife  cannot drive at nighttime or when it is darker so we will schedule him in the morning.  We will schedule him for November 16, at 9 AM. -Discussed with the patient that he would need a Covid test prior to surgery.  He is in agreement. -Given his medical comorbidities, we will send clearance forms to his PCP, cardiologist, and vascular surgery given his somewhat recent carotid endarterectomy.  He understands he will have to stop his aspirin and Plavix 5 days prior to surgery.  Face-to-face time spent with the patient and care providers was 25 minutes, with more than 50% of the time spent counseling, educating, and coordinating care of the patient.     Melvyn Neth, [facility]

## 2018-12-23 NOTE — Patient Instructions (Addendum)
November 10th will be patient's last day taking medication(s) of Aspirin and Plavix. Patient will restart medication(s) Aspirin and Plavix on November 18th. Our surgery scheduler Levada Dy will contact you within the next 24-48 hours to get you scheduled for surgery. Please have the "BLUE" sheet available when Levada Dy gives you a call.  Laparoscopic Cholecystectomy Laparoscopic cholecystectomy is surgery to remove the gallbladder. The gallbladder is a pear-shaped organ that lies beneath the liver on the right side of the body. The gallbladder stores bile, which is a fluid that helps the body to digest fats. Cholecystectomy is often done for inflammation of the gallbladder (cholecystitis). This condition is usually caused by a buildup of gallstones (cholelithiasis) in the gallbladder. Gallstones can block the flow of bile, which can result in inflammation and pain. In severe cases, emergency surgery may be required. This procedure is done though small incisions in your abdomen (laparoscopic surgery). A thin scope with a camera (laparoscope) is inserted through one incision. Thin surgical instruments are inserted through the other incisions. In some cases, a laparoscopic procedure may be turned into a type of surgery that is done through a larger incision (open surgery). Tell a health care provider about:  Any allergies you have.  All medicines you are taking, including vitamins, herbs, eye drops, creams, and over-the-counter medicines.  Any problems you or family members have had with anesthetic medicines.  Any blood disorders you have.  Any surgeries you have had.  Any medical conditions you have.  Whether you are pregnant or may be pregnant. What are the risks? Generally, this is a safe procedure. However, problems may occur, including:  Infection.  Bleeding.  Allergic reactions to medicines.  Damage to other structures or organs.  A stone remaining in the common bile duct. The common  bile duct carries bile from the gallbladder into the small intestine.  A bile leak from the cyst duct that is clipped when your gallbladder is removed. What happens before the procedure? Staying hydrated Follow instructions from your health care provider about hydration, which may include:  Up to 2 hours before the procedure - you may continue to drink clear liquids, such as water, clear fruit juice, black coffee, and plain tea. Eating and drinking restrictions Follow instructions from your health care provider about eating and drinking, which may include:  8 hours before the procedure - stop eating heavy meals or foods such as meat, fried foods, or fatty foods.  6 hours before the procedure - stop eating light meals or foods, such as toast or cereal.  6 hours before the procedure - stop drinking milk or drinks that contain milk.  2 hours before the procedure - stop drinking clear liquids. Medicines  Ask your health care provider about: ? Changing or stopping your regular medicines. This is especially important if you are taking diabetes medicines or blood thinners. ? Taking medicines such as aspirin and ibuprofen. These medicines can thin your blood. Do not take these medicines before your procedure if your health care provider instructs you not to.  You may be given antibiotic medicine to help prevent infection. General instructions  Let your health care provider know if you develop a cold or an infection before surgery.  Plan to have someone take you home from the hospital or clinic.  Ask your health care provider how your surgical site will be marked or identified. What happens during the procedure?   To reduce your risk of infection: ? Your health care team will wash  or sanitize their hands. ? Your skin will be washed with soap. ? Hair may be removed from the surgical area.  An IV tube may be inserted into one of your veins.  You will be given one or more of the  following: ? A medicine to help you relax (sedative). ? A medicine to make you fall asleep (general anesthetic).  A breathing tube will be placed in your mouth.  Your surgeon will make several small cuts (incisions) in your abdomen.  The laparoscope will be inserted through one of the small incisions. The camera on the laparoscope will send images to a TV screen (monitor) in the operating room. This lets your surgeon see inside your abdomen.  Air-like gas will be pumped into your abdomen. This will expand your abdomen to give the surgeon more room to perform the surgery.  Other tools that are needed for the procedure will be inserted through the other incisions. The gallbladder will be removed through one of the incisions.  Your common bile duct may be examined. If stones are found in the common bile duct, they may be removed.  After your gallbladder has been removed, the incisions will be closed with stitches (sutures), staples, or skin glue.  Your incisions may be covered with a bandage (dressing). The procedure may vary among health care providers and hospitals. What happens after the procedure?  Your blood pressure, heart rate, breathing rate, and blood oxygen level will be monitored until the medicines you were given have worn off.  You will be given medicines as needed to control your pain.  Do not drive for 24 hours if you were given a sedative. This information is not intended to replace advice given to you by your health care provider. Make sure you discuss any questions you have with your health care provider. Document Released: 02/18/2005 Document Revised: 01/31/2017 Document Reviewed: 08/07/2015 Elsevier Patient Education  2020 [employer].

## 2018-12-23 NOTE — Progress Notes (Addendum)
Clearances faxed for patient.   Dr.Jeffrey Spark- PCP Medical Clearance [phone]  Dr.Shaukat [facility]- Cardiac Clearance  [place]- Vascular Clearance  [phone]

## 2018-12-23 NOTE — H&P (View-Only) (Signed)
12/23/2018  History of Present Illness: Jason House is a 78 y.o. male presenting for follow-up of gallstone pancreatitis.  He was hospitalized in January 2019 and had an ERCP with stent placement initially.  After discharge she had the stent removed and completion of ERCP in February 2019.  He was initially scheduled for March 2019, but this was canceled due to COPD exacerbation.  Unfortunately he was never rescheduled.  In the interim, he had a right carotid endarterectomy with Dr. Lucky Cowboy in December 2019.  Patient reports that he has been doing well and denies any abdominal pain issues.  From time to time he will have some mild soreness but reports that it goes away quickly and denies any nausea vomiting with those episodes.  Denies any issues with jaundice.  He recently saw Dr. Lucky Cowboy in September 2020 and has been doing well at that point.  Past Medical History: Past Medical History:  Diagnosis Date  . Anemia   . COPD (chronic obstructive pulmonary disease) ([place])   . Coronary artery disease   . Glaucoma   . H/O ETOH abuse   . History of shingles   . Hyperlipidemia   . Hypertension   . Hypothyroidism      Past Surgical History: Past Surgical History:  Procedure Laterality Date  . COLONOSCOPY WITH PROPOFOL N/A 09/23/2014   Procedure: COLONOSCOPY WITH PROPOFOL;  Surgeon: Josefine Class, MD;  Location: [facility] ENDOSCOPY;  Service: Endoscopy;  Laterality: N/A;  . CORONARY ANGIOPLASTY    . CORONARY ARTERY BYPASS GRAFT    . ENDARTERECTOMY Right 02/12/2018   Procedure: ENDARTERECTOMY CAROTID;  Surgeon: Algernon Huxley, MD;  Location: [facility] ORS;  Service: Vascular;  Laterality: Right;  . ERCP N/A 04/22/2017   Procedure: ENDOSCOPIC RETROGRADE CHOLANGIOPANCREATOGRAPHY (ERCP);  Surgeon: Lucilla Lame, MD;  Location: [facility] ENDOSCOPY;  Service: Endoscopy;  Laterality: N/A;  . EYE SURGERY Bilateral    cataract    Home Medications: Prior to Admission medications   Medication Sig Start Date End  Date Taking? Authorizing Provider  Albuterol Sulfate (PROAIR RESPICLICK) 185 (90 Base) MCG/ACT AEPB Inhale 1 puff into the lungs every 4 (four) hours as needed (shortness of breath). 05/15/17  Yes Darel Hong, MD  aspirin EC 81 MG tablet Take 81 mg by mouth daily.   Yes [provider]  atorvastatin (LIPITOR) 80 MG tablet Take 80 mg by mouth daily.   Yes [provider]  azelastine (ASTELIN) 0.1 % nasal spray USE 1 SPRAY(S) IN EACH NOSTRIL TWICE DAILY AS NEEDED FOR RUNNY NOSE 02/23/18  Yes [provider]  BYSTOLIC 5 MG tablet Take 5 mg by mouth daily. 12/19/17  Yes [provider]  clopidogrel (PLAVIX) 75 MG tablet Take 75 mg by mouth daily.   Yes [provider]  cyclobenzaprine (FLEXERIL) 10 MG tablet Take 10 mg by mouth 3 (three) times daily as needed for muscle spasms.   Yes [provider]  dorzolamide-timolol (COSOPT) 22.3-6.8 MG/ML ophthalmic solution Place 1 drop into both eyes 2 (two) times daily.   Yes [provider]  guaiFENesin (MUCINEX) 600 MG 12 hr tablet Take 1,200 mg by mouth 2 (two) times daily.   Yes [provider]  hydrochlorothiazide (HYDRODIURIL) 25 MG tablet Take 12.5 mg by mouth every evening. Takes 1/2 of a 25 mg tab   Yes [provider]  latanoprost (XALATAN) 0.005 % ophthalmic solution Place 1 drop into both eyes at bedtime.   Yes [provider]  levothyroxine (SYNTHROID, [place])  50 MCG tablet Take 1 tablet by mouth daily before breakfast.  01/04/17  Yes [provider]  lisinopril (PRINIVIL,ZESTRIL) 10 MG tablet Take 10 mg by mouth daily. 12/20/17  Yes [provider]  ranolazine (RANEXA) 500 MG 12 hr tablet 500 mg 2 (two) times daily.  12/30/17  Yes [provider]  Spacer/Aero Chamber Mouthpiece MISC 1 Units by Does not apply route every 4 (four) hours as needed (wheezing). 05/15/17  Yes Darel Hong, MD  vitamin B-12 (CYANOCOBALAMIN) 1000 MCG  tablet Take 1,000 mcg by mouth daily.   Yes [provider]    Allergies: No Known Allergies  Review of Systems: Review of Systems  Constitutional: Negative for chills and fever.  Eyes: Negative for blurred vision.  Respiratory: Negative for shortness of breath.   Cardiovascular: Negative for chest pain.  Gastrointestinal: Negative for abdominal pain, nausea and vomiting.    Physical Exam BP 138/69   Pulse (!) 54   Temp (!) 97.2 F (36.2 C) (Temporal)   Ht 5\' 7"  (1.702 m)   Wt 159 lb 12.8 oz (72.5 kg)   SpO2 95%   BMI 25.03 kg/m  CONSTITUTIONAL: No acute distress HEENT:  Normocephalic, atraumatic, extraocular motion intact. RESPIRATORY:  Lungs are clear, and breath sounds are equal bilaterally. Normal respiratory effort without pathologic use of accessory muscles. CARDIOVASCULAR: Heart is regular without murmurs, gallops, or rubs. GI: The abdomen is soft, nondistended, nontender to palpation.  NEUROLOGIC:  Motor and sensation is grossly normal.  Cranial nerves are grossly intact. PSYCH:  Alert and oriented to person, place and time. Affect is normal.  Labs/Imaging: Barium swallow on 12/04/2018: IMPRESSION: 1. Mild esophageal dysmotility. 2. 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 3. Barium tablet initially entered the vallecula on initial swallow, but was cleared into the esophagus and stomach following an additional swallow. 4. Small hiatal hernia.  Assessment and Plan: This is a 78 y.o. male with a history of gallstone pancreatitis in January 2019, now presenting for scheduling surgery for cholecystectomy.  -Discussed with the patient that we can schedule him for a laparoscopic cholecystectomy with intraoperative cholangiogram.  Discussed with him the risks of bleeding, infection, injury to surrounding structures, possibility of open procedure.  He is willing to proceed. -Patient would like to reschedule for sometime in November.  His wife  cannot drive at nighttime or when it is darker so we will schedule him in the morning.  We will schedule him for November 16, at 9 AM. -Discussed with the patient that he would need a Covid test prior to surgery.  He is in agreement. -Given his medical comorbidities, we will send clearance forms to his PCP, cardiologist, and vascular surgery given his somewhat recent carotid endarterectomy.  He understands he will have to stop his aspirin and Plavix 5 days prior to surgery.  Face-to-face time spent with the patient and care providers was 25 minutes, with more than 50% of the time spent counseling, educating, and coordinating care of the patient.     Melvyn Neth, [facility]

## 2018-12-25 NOTE — Progress Notes (Addendum)
12/25/18 PCP Medical Clearance. This patient is optimized for surgery at [facility] risk per [facility].  12/30/18 Vascular Clearance received. Per Dr. Leotis Pain. Patient is optimized for surgery at [place].   12/30/18 Cardiac Clearance received. Per Dr. Neoma Laming. Patient is optimized for surgery at [place].

## 2018-12-29 DIAGNOSIS — R1312 Dysphagia, oropharyngeal phase: Secondary | ICD-10-CM | POA: Diagnosis not present

## 2018-12-30 ENCOUNTER — Other Ambulatory Visit: Payer: Self-pay | Admitting: Gastroenterology

## 2018-12-30 DIAGNOSIS — I10 Essential (primary) hypertension: Secondary | ICD-10-CM | POA: Diagnosis not present

## 2018-12-30 DIAGNOSIS — I2510 Atherosclerotic heart disease of native coronary artery without angina pectoris: Secondary | ICD-10-CM | POA: Diagnosis not present

## 2018-12-30 DIAGNOSIS — I25810 Atherosclerosis of coronary artery bypass graft(s) without angina pectoris: Secondary | ICD-10-CM | POA: Diagnosis not present

## 2018-12-30 DIAGNOSIS — R0602 Shortness of breath: Secondary | ICD-10-CM | POA: Diagnosis not present

## 2018-12-30 DIAGNOSIS — E782 Mixed hyperlipidemia: Secondary | ICD-10-CM | POA: Diagnosis not present

## 2018-12-30 DIAGNOSIS — R1310 Dysphagia, unspecified: Secondary | ICD-10-CM

## 2019-01-04 ENCOUNTER — Telehealth: Payer: Self-pay

## 2019-01-04 NOTE — Telephone Encounter (Signed)
Pt has been advised of pre admission date/time, Covid Testing date and Surgery date.  Surgery Date:01/18/19 Preadmission Testing Date: 01/12/19 at 8:30 am  Covid Testing Date: 01/14/19 - patient advised to go to the [place] ([place]) between 8-10:30 am.  Earl Lites Video sent via [facility] Surgical Video and Covid Education Video.  Patient has been made aware to call [phone] on 01/15/19, between 1-3:00pm, to find out what time to arrive.

## 2019-01-12 ENCOUNTER — Encounter
Admission: RE | Admit: 2019-01-12 | Discharge: 2019-01-12 | Disposition: A | Discharge status: Home / Self Care | Payer: PPO | Source: Ambulatory Visit | Attending: Surgery | Admitting: Surgery

## 2019-01-12 ENCOUNTER — Other Ambulatory Visit: Payer: Self-pay

## 2019-01-12 DIAGNOSIS — Z01818 Encounter for other preprocedural examination: Secondary | ICD-10-CM | POA: Insufficient documentation

## 2019-01-12 LAB — CBC WITH DIFFERENTIAL/PLATELET
Abs Immature Granulocytes: 0.04 10*3/uL (ref 0.00–0.07)
Basophils Absolute: 0.1 10*3/uL (ref 0.0–0.1)
Basophils Relative: 1 %
Eosinophils Absolute: 1 10*3/uL — ABNORMAL HIGH (ref 0.0–0.5)
Eosinophils Relative: 9 %
HCT: 33.3 % — ABNORMAL LOW (ref 39.0–52.0)
Hemoglobin: 11.4 g/dL — ABNORMAL LOW (ref 13.0–17.0)
Immature Granulocytes: 0 %
Lymphocytes Relative: 28 %
Lymphs Abs: 3.2 10*3/uL (ref 0.7–4.0)
MCH: 31.3 pg (ref 26.0–34.0)
MCHC: 34.2 g/dL (ref 30.0–36.0)
MCV: 91.5 fL (ref 80.0–100.0)
Monocytes Absolute: 0.9 10*3/uL (ref 0.1–1.0)
Monocytes Relative: 8 %
Neutro Abs: 6.3 10*3/uL (ref 1.7–7.7)
Neutrophils Relative %: 54 %
Platelets: 211 10*3/uL (ref 150–400)
RBC: 3.64 MIL/uL — ABNORMAL LOW (ref 4.22–5.81)
RDW: 12.3 % (ref 11.5–15.5)
WBC: 11.4 10*3/uL — ABNORMAL HIGH (ref 4.0–10.5)
nRBC: 0 % (ref 0.0–0.2)

## 2019-01-12 LAB — COMPREHENSIVE METABOLIC PANEL
ALT: 12 U/L (ref 0–44)
AST: 14 U/L — ABNORMAL LOW (ref 15–41)
Albumin: 4 g/dL (ref 3.5–5.0)
Alkaline Phosphatase: 42 U/L (ref 38–126)
Anion gap: 9 (ref 5–15)
BUN: 23 mg/dL (ref 8–23)
CO2: 25 mmol/L (ref 22–32)
Calcium: 9.2 mg/dL (ref 8.9–10.3)
Chloride: 97 mmol/L — ABNORMAL LOW (ref 98–111)
Creatinine, Ser: 1.45 mg/dL — ABNORMAL HIGH (ref 0.61–1.24)
GFR calc Af Amer: 53 mL/min — ABNORMAL LOW (ref >60)
GFR calc non Af Amer: 46 mL/min — ABNORMAL LOW (ref >60)
Glucose, Bld: 98 mg/dL (ref 70–99)
Potassium: 4.3 mmol/L (ref 3.5–5.1)
Sodium: 131 mmol/L — ABNORMAL LOW (ref 135–145)
Total Bilirubin: 1.2 mg/dL (ref 0.3–1.2)
Total Protein: 7 g/dL (ref 6.5–8.1)

## 2019-01-12 LAB — TYPE AND SCREEN
ABO/RH(D): B POS
Antibody Screen: NEGATIVE

## 2019-01-12 LAB — PROTIME-INR
INR: 1 (ref 0.8–1.2)
Prothrombin Time: 13.4 seconds (ref 11.4–15.2)

## 2019-01-12 NOTE — Patient Instructions (Signed)
Your procedure is scheduled on: Monday 01/18/19 Report to [place]. To find out your arrival time please call [phone] between 1PM - 3PM on Friday 01/15/19.  Remember: Instructions that are not followed completely may result in serious medical risk, up to and including death, or upon the discretion of your surgeon and anesthesiologist your surgery may need to be rescheduled.     _X__ 1. Do not eat food after midnight the night before your procedure.                 No gum chewing or hard candies. You may drink clear liquids up to 2 hours                 before you are scheduled to arrive for your surgery- DO not drink clear                 liquids within 2 hours of the start of your surgery.                 Clear Liquids include:  water, apple juice without pulp, clear carbohydrate                 drink such as Clearfast or Gatorade, Black Coffee or Tea (Do not add                 anything to coffee or tea). Diabetics water only  __X__2.  On the morning of surgery brush your teeth with toothpaste and water, you                 may rinse your mouth with mouthwash if you wish.  Do not swallow any              toothpaste of mouthwash.     _X__ 3.  No Alcohol for 24 hours before or after surgery.   _X__ 4.  Do Not Smoke or use e-cigarettes For 24 Hours Prior to Your Surgery.                 Do not use any chewable tobacco products for at least 6 hours prior to                 surgery.  ____  5.  Bring all medications with you on the day of surgery if instructed.   __X__  6.  Notify your doctor if there is any change in your medical condition      (cold, fever, infections).     Do not wear jewelry, make-up, hairpins, clips or nail polish. Do not wear lotions, powders, or perfumes.  Do not shave 48 hours prior to surgery. Men may shave face and neck. Do not bring valuables to the hospital.    [facility] is not responsible for  any belongings or valuables.  Contacts, dentures/partials or body piercings may not be worn into surgery. Bring a case for your contacts, glasses or hearing aids, a denture cup will be supplied. Leave your suitcase in the car. After surgery it may be brought to your room. For patients admitted to the hospital, discharge time is determined by your treatment team.   Patients discharged the day of surgery will not be allowed to drive home.   Please read over the following fact sheets that you were given:   MRSA Information  __X__ Take these medicines the morning of surgery with A SIP OF WATER:  1. BYSTOLIC  2. levothyroxine (SYNTHROID, LEVOTHROID)   3. ranolazine (RANEXA)  4.  5.  6.  ____ Fleet Enema (as directed)   __X__ Use CHG Soap/SAGE wipes as directed  __X__ Use inhalers on the day of surgery  ____ Stop metformin/Janumet/Farxiga 2 days prior to surgery    ____ Take 1/2 of usual insulin dose the night before surgery. No insulin the morning          of surgery.   __X__ Stop Blood Thinners Coumadin/Plavix/Xarelto/Pleta/Pradaxa/Eliquis/Effient/Aspirin  on   Or contact your Surgeon, Cardiologist or Medical Doctor regarding  ability to stop your blood thinners  __X__ Stop Anti-inflammatories 7 days before surgery such as Advil, Ibuprofen, Motrin,  BC or Goodies Powder, Naprosyn, Naproxen, Aleve, Aspirin    __X__ Stop all herbal supplements, fish oil or vitamin E until after surgery.    ____ Bring C-Pap to the hospital.    STOP PLAVIX AS INSTRUCTED BY YOUR DOCTOR.

## 2019-01-14 ENCOUNTER — Other Ambulatory Visit
Admission: RE | Admit: 2019-01-14 | Discharge: 2019-01-14 | Disposition: A | Discharge status: Home / Self Care | Payer: PPO | Source: Ambulatory Visit | Attending: Surgery | Admitting: Surgery

## 2019-01-14 ENCOUNTER — Other Ambulatory Visit: Payer: Self-pay

## 2019-01-14 DIAGNOSIS — Z20828 Contact with and (suspected) exposure to other viral communicable diseases: Secondary | ICD-10-CM | POA: Insufficient documentation

## 2019-01-14 DIAGNOSIS — Z01812 Encounter for preprocedural laboratory examination: Secondary | ICD-10-CM | POA: Diagnosis not present

## 2019-01-14 LAB — SARS CORONAVIRUS 2 (TAT 6-24 HRS): SARS Coronavirus 2: NEGATIVE

## 2019-01-18 ENCOUNTER — Other Ambulatory Visit: Payer: Self-pay

## 2019-01-18 ENCOUNTER — Ambulatory Visit: Payer: PPO | Admitting: Anesthesiology

## 2019-01-18 ENCOUNTER — Ambulatory Visit
Admission: RE | Admit: 2019-01-18 | Discharge: 2019-01-18 | Disposition: A | Discharge status: Home / Self Care | Payer: PPO | Attending: Surgery | Admitting: Surgery

## 2019-01-18 ENCOUNTER — Encounter
Admission: RE | Disposition: A | Discharge status: Home / Self Care | Payer: Self-pay | Source: Home / Self Care | Attending: Surgery

## 2019-01-18 ENCOUNTER — Ambulatory Visit: Payer: PPO

## 2019-01-18 DIAGNOSIS — N189 Chronic kidney disease, unspecified: Secondary | ICD-10-CM | POA: Diagnosis not present

## 2019-01-18 DIAGNOSIS — K660 Peritoneal adhesions (postprocedural) (postinfection): Secondary | ICD-10-CM | POA: Diagnosis not present

## 2019-01-18 DIAGNOSIS — Z7902 Long term (current) use of antithrombotics/antiplatelets: Secondary | ICD-10-CM | POA: Insufficient documentation

## 2019-01-18 DIAGNOSIS — K851 Biliary acute pancreatitis without necrosis or infection: Secondary | ICD-10-CM | POA: Diagnosis not present

## 2019-01-18 DIAGNOSIS — E039 Hypothyroidism, unspecified: Secondary | ICD-10-CM | POA: Diagnosis not present

## 2019-01-18 DIAGNOSIS — Z79890 Hormone replacement therapy (postmenopausal): Secondary | ICD-10-CM | POA: Insufficient documentation

## 2019-01-18 DIAGNOSIS — Z419 Encounter for procedure for purposes other than remedying health state, unspecified: Secondary | ICD-10-CM

## 2019-01-18 DIAGNOSIS — E785 Hyperlipidemia, unspecified: Secondary | ICD-10-CM | POA: Insufficient documentation

## 2019-01-18 DIAGNOSIS — Z79899 Other long term (current) drug therapy: Secondary | ICD-10-CM | POA: Diagnosis not present

## 2019-01-18 DIAGNOSIS — Z87891 Personal history of nicotine dependence: Secondary | ICD-10-CM | POA: Insufficient documentation

## 2019-01-18 DIAGNOSIS — I2510 Atherosclerotic heart disease of native coronary artery without angina pectoris: Secondary | ICD-10-CM | POA: Diagnosis not present

## 2019-01-18 DIAGNOSIS — J449 Chronic obstructive pulmonary disease, unspecified: Secondary | ICD-10-CM | POA: Insufficient documentation

## 2019-01-18 DIAGNOSIS — Z951 Presence of aortocoronary bypass graft: Secondary | ICD-10-CM | POA: Diagnosis not present

## 2019-01-18 DIAGNOSIS — I129 Hypertensive chronic kidney disease with stage 1 through stage 4 chronic kidney disease, or unspecified chronic kidney disease: Secondary | ICD-10-CM | POA: Diagnosis not present

## 2019-01-18 DIAGNOSIS — I10 Essential (primary) hypertension: Secondary | ICD-10-CM | POA: Diagnosis not present

## 2019-01-18 DIAGNOSIS — K915 Postcholecystectomy syndrome: Secondary | ICD-10-CM | POA: Diagnosis not present

## 2019-01-18 DIAGNOSIS — H409 Unspecified glaucoma: Secondary | ICD-10-CM | POA: Insufficient documentation

## 2019-01-18 DIAGNOSIS — Z7982 Long term (current) use of aspirin: Secondary | ICD-10-CM | POA: Insufficient documentation

## 2019-01-18 DIAGNOSIS — K811 Chronic cholecystitis: Secondary | ICD-10-CM | POA: Diagnosis not present

## 2019-01-18 HISTORY — PX: CHOLECYSTECTOMY: SHX55

## 2019-01-18 SURGERY — LAPAROSCOPIC CHOLECYSTECTOMY WITH INTRAOPERATIVE CHOLANGIOGRAM
Anesthesia: General

## 2019-01-18 MED ORDER — GLYCOPYRROLATE 0.2 MG/ML IJ SOLN
INTRAMUSCULAR | Status: AC
  Filled 2019-01-18: qty 1

## 2019-01-18 MED ORDER — SUCCINYLCHOLINE CHLORIDE 20 MG/ML IJ SOLN
INTRAMUSCULAR | Status: AC
  Filled 2019-01-18: qty 1

## 2019-01-18 MED ORDER — CEFAZOLIN SODIUM-DEXTROSE 2-4 GM/100ML-% IV SOLN
INTRAVENOUS | Status: AC
  Filled 2019-01-18: qty 100

## 2019-01-18 MED ORDER — EPINEPHRINE PF 1 MG/ML IJ SOLN
INTRAMUSCULAR | Status: AC
  Filled 2019-01-18: qty 1

## 2019-01-18 MED ORDER — FENTANYL CITRATE (PF) 100 MCG/2ML IJ SOLN
INTRAMUSCULAR | Status: AC
  Filled 2019-01-18: qty 2

## 2019-01-18 MED ORDER — BUPIVACAINE HCL (PF) 0.25 % IJ SOLN
INTRAMUSCULAR | Status: AC
  Filled 2019-01-18: qty 30

## 2019-01-18 MED ORDER — SUGAMMADEX SODIUM 200 MG/2ML IV SOLN
INTRAVENOUS | Status: AC
  Filled 2019-01-18: qty 2

## 2019-01-18 MED ORDER — BUPIVACAINE-EPINEPHRINE (PF) 0.25% -1:200000 IJ SOLN
INTRAMUSCULAR | Status: DC | PRN
  Administered 2019-01-18: 30 mL

## 2019-01-18 MED ORDER — SUGAMMADEX SODIUM 200 MG/2ML IV SOLN
INTRAVENOUS | Status: DC | PRN
  Administered 2019-01-18: 200 mg via INTRAVENOUS

## 2019-01-18 MED ORDER — PROPOFOL 500 MG/50ML IV EMUL
INTRAVENOUS | Status: AC
  Filled 2019-01-18: qty 50

## 2019-01-18 MED ORDER — FAMOTIDINE 20 MG PO TABS
20.0000 mg | ORAL_TABLET | Freq: Once | ORAL | Status: AC
  Administered 2019-01-18: 08:00:00 20 mg via ORAL

## 2019-01-18 MED ORDER — PROPOFOL 10 MG/ML IV BOLUS
INTRAVENOUS | Status: DC | PRN
  Administered 2019-01-18: 30 mg via INTRAVENOUS
  Administered 2019-01-18: 180 mg via INTRAVENOUS

## 2019-01-18 MED ORDER — ONDANSETRON HCL 4 MG/2ML IJ SOLN: 4.0000 mg | Freq: Once | INTRAMUSCULAR | Status: DC | PRN

## 2019-01-18 MED ORDER — CEFAZOLIN SODIUM-DEXTROSE 2-4 GM/100ML-% IV SOLN
2.0000 g | INTRAVENOUS | Status: AC
  Administered 2019-01-18: 2 g via INTRAVENOUS

## 2019-01-18 MED ORDER — EPHEDRINE SULFATE 50 MG/ML IJ SOLN
INTRAMUSCULAR | Status: DC | PRN
  Administered 2019-01-18: 10 mg via INTRAVENOUS
  Administered 2019-01-18: 5 mg via INTRAVENOUS
  Administered 2019-01-18: 10 mg via INTRAVENOUS

## 2019-01-18 MED ORDER — CHLORHEXIDINE GLUCONATE CLOTH 2 % EX PADS: 6.0000 | MEDICATED_PAD | Freq: Once | CUTANEOUS | Status: DC

## 2019-01-18 MED ORDER — SODIUM CHLORIDE (PF) 0.9 % IJ SOLN
INTRAMUSCULAR | Status: AC
  Filled 2019-01-18: qty 50

## 2019-01-18 MED ORDER — DEXAMETHASONE SODIUM PHOSPHATE 10 MG/ML IJ SOLN
INTRAMUSCULAR | Status: AC
  Filled 2019-01-18: qty 2

## 2019-01-18 MED ORDER — LIDOCAINE HCL (PF) 2 % IJ SOLN
INTRAMUSCULAR | Status: AC
  Filled 2019-01-18: qty 10

## 2019-01-18 MED ORDER — ACETAMINOPHEN 500 MG PO TABS
ORAL_TABLET | ORAL | Status: AC
  Administered 2019-01-18: 1000 mg via ORAL
  Filled 2019-01-18: qty 2

## 2019-01-18 MED ORDER — ONDANSETRON HCL 4 MG/2ML IJ SOLN
INTRAMUSCULAR | Status: DC | PRN
  Administered 2019-01-18: 4 mg via INTRAVENOUS

## 2019-01-18 MED ORDER — FAMOTIDINE 20 MG PO TABS
ORAL_TABLET | ORAL | Status: AC
  Administered 2019-01-18: 20 mg via ORAL
  Filled 2019-01-18: qty 1

## 2019-01-18 MED ORDER — ACETAMINOPHEN 500 MG PO TABS
1000.0000 mg | ORAL_TABLET | ORAL | Status: AC
  Administered 2019-01-18: 08:00:00 1000 mg via ORAL

## 2019-01-18 MED ORDER — ROCURONIUM BROMIDE 50 MG/5ML IV SOLN
INTRAVENOUS | Status: AC
  Filled 2019-01-18: qty 3

## 2019-01-18 MED ORDER — SODIUM CHLORIDE 0.9 % IV SOLN
INTRAVENOUS | Status: DC | PRN
  Administered 2019-01-18: 40 mL

## 2019-01-18 MED ORDER — FENTANYL CITRATE (PF) 100 MCG/2ML IJ SOLN: 25.0000 ug | INTRAMUSCULAR | Status: DC | PRN

## 2019-01-18 MED ORDER — ROCURONIUM BROMIDE 100 MG/10ML IV SOLN
INTRAVENOUS | Status: DC | PRN
  Administered 2019-01-18: 10 mg via INTRAVENOUS
  Administered 2019-01-18: 40 mg via INTRAVENOUS

## 2019-01-18 MED ORDER — ONDANSETRON HCL 4 MG/2ML IJ SOLN
INTRAMUSCULAR | Status: AC
  Filled 2019-01-18: qty 6

## 2019-01-18 MED ORDER — GABAPENTIN 300 MG PO CAPS
ORAL_CAPSULE | ORAL | Status: AC
  Administered 2019-01-18: 300 mg via ORAL
  Filled 2019-01-18: qty 1

## 2019-01-18 MED ORDER — OXYCODONE HCL 5 MG PO TABS: 5.0000 mg | ORAL_TABLET | ORAL | 0 refills | Status: DC | PRN

## 2019-01-18 MED ORDER — LACTATED RINGERS IV SOLN
INTRAVENOUS | Status: DC
  Administered 2019-01-18 (×2): via INTRAVENOUS

## 2019-01-18 MED ORDER — LIDOCAINE HCL (CARDIAC) PF 100 MG/5ML IV SOSY
PREFILLED_SYRINGE | INTRAVENOUS | Status: DC | PRN
  Administered 2019-01-18: 80 mg via INTRAVENOUS

## 2019-01-18 MED ORDER — FENTANYL CITRATE (PF) 100 MCG/2ML IJ SOLN
INTRAMUSCULAR | Status: DC | PRN
  Administered 2019-01-18 (×2): 50 ug via INTRAVENOUS

## 2019-01-18 MED ORDER — SUCCINYLCHOLINE CHLORIDE 20 MG/ML IJ SOLN
INTRAMUSCULAR | Status: DC | PRN
  Administered 2019-01-18: 100 mg via INTRAVENOUS

## 2019-01-18 MED ORDER — GABAPENTIN 300 MG PO CAPS
300.0000 mg | ORAL_CAPSULE | ORAL | Status: AC
  Administered 2019-01-18: 08:00:00 300 mg via ORAL

## 2019-01-18 MED ORDER — DEXAMETHASONE SODIUM PHOSPHATE 10 MG/ML IJ SOLN
INTRAMUSCULAR | Status: DC | PRN
  Administered 2019-01-18: 10 mg via INTRAVENOUS

## 2019-01-18 SURGICAL SUPPLY — 48 items
"PENCIL ELECTRO HAND CTR " (MISCELLANEOUS) ×1 IMPLANT
APPLIER CLIP 5 13 M/L LIGAMAX5 (MISCELLANEOUS) ×3
BLADE CLIPPER SURG (BLADE) ×2 IMPLANT
BLADE SURG 15 STRL LF DISP TIS (BLADE) ×1 IMPLANT
BLADE SURG 15 STRL SS (BLADE) ×2
CANISTER SUCT 1200ML W/VALVE (MISCELLANEOUS) ×3 IMPLANT
CATH CHOLANGI 4FR 420404F (CATHETERS) IMPLANT
CHLORAPREP W/TINT 26 (MISCELLANEOUS) ×3 IMPLANT
CLIP APPLIE 5 13 M/L LIGAMAX5 (MISCELLANEOUS) ×1 IMPLANT
COVER WAND RF STERILE (DRAPES) ×3 IMPLANT
DERMABOND ADVANCED (GAUZE/BANDAGES/DRESSINGS) ×2
DERMABOND ADVANCED .7 DNX12 (GAUZE/BANDAGES/DRESSINGS) ×1 IMPLANT
DRAPE C-ARM XRAY 36X54 (DRAPES) IMPLANT
ELECT CAUTERY BLADE TIP 2.5 (TIP) ×3
ELECT REM PT RETURN 9FT ADLT (ELECTROSURGICAL) ×3
ELECTRODE CAUTERY BLDE TIP 2.5 (TIP) ×1 IMPLANT
ELECTRODE REM PT RTRN 9FT ADLT (ELECTROSURGICAL) ×1 IMPLANT
GLOVE SURG SYN 7.0 (GLOVE) ×3 IMPLANT
GLOVE SURG SYN 7.0 PF PI (GLOVE) ×1 IMPLANT
GLOVE SURG SYN 7.5  E (GLOVE) ×2
GLOVE SURG SYN 7.5 E (GLOVE) ×1 IMPLANT
GLOVE SURG SYN 7.5 PF PI (GLOVE) ×1 IMPLANT
GOWN STRL REUS W/ TWL LRG LVL3 (GOWN DISPOSABLE) ×3 IMPLANT
GOWN STRL REUS W/TWL LRG LVL3 (GOWN DISPOSABLE) ×6
IRRIGATION STRYKERFLOW (MISCELLANEOUS) IMPLANT
IRRIGATOR STRYKERFLOW (MISCELLANEOUS) ×3
IV CATH ANGIO 12GX3 LT BLUE (NEEDLE) ×3 IMPLANT
IV NS 1000ML (IV SOLUTION)
IV NS 1000ML BAXH (IV SOLUTION) IMPLANT
JACKSON PRATT 10 (INSTRUMENTS) IMPLANT
L-HOOK LAP DISP 36CM (ELECTROSURGICAL) ×3
LABEL OR SOLS (LABEL) ×3 IMPLANT
LHOOK LAP DISP 36CM (ELECTROSURGICAL) ×1 IMPLANT
NEEDLE HYPO 22GX1.5 SAFETY (NEEDLE) ×6 IMPLANT
PACK LAP CHOLECYSTECTOMY (MISCELLANEOUS) ×3 IMPLANT
PENCIL ELECTRO HAND CTR (MISCELLANEOUS) ×3 IMPLANT
POUCH SPECIMEN RETRIEVAL 10MM (ENDOMECHANICALS) ×3 IMPLANT
SCISSORS METZENBAUM CVD 33 (INSTRUMENTS) ×3 IMPLANT
SET TUBE SMOKE EVAC HIGH FLOW (TUBING) ×3 IMPLANT
SLEEVE ADV FIXATION 5X100MM (TROCAR) ×9 IMPLANT
SPONGE VERSALON 4X4 4PLY (MISCELLANEOUS) IMPLANT
SUT MNCRL 4-0 (SUTURE) ×4
SUT MNCRL 4-0 27XMFL (SUTURE) ×2
SUT VICRYL 0 AB UR-6 (SUTURE) ×3 IMPLANT
SUTURE MNCRL 4-0 27XMF (SUTURE) ×1 IMPLANT
SYR 10ML LL (SYRINGE) ×2 IMPLANT
TROCAR BALLN GELPORT 12X130M (ENDOMECHANICALS) ×3 IMPLANT
TROCAR Z-THREAD OPTICAL 5X100M (TROCAR) ×3 IMPLANT

## 2019-01-18 NOTE — Interval H&P Note (Signed)
History and Physical Interval Note:  01/18/2019 8:01 AM  Jason House  has presented today for surgery, with the diagnosis of gallstone pancreatitis.  The various methods of treatment have been discussed with the patient and family. After consideration of risks, benefits and other options for treatment, the patient has consented to  Procedure(s): LAPAROSCOPIC CHOLECYSTECTOMY WITH INTRAOPERATIVE CHOLANGIOGRAM (N/A) as a surgical intervention.  The patient's history has been reviewed, patient examined, no change in status, stable for surgery.  I have reviewed the patient's chart and labs.  Questions were answered to the patient's satisfaction.     [name]

## 2019-01-18 NOTE — Op Note (Signed)
Procedure Date:  01/18/2019  Pre-operative Diagnosis:  Gallstone pancreatitis  Post-operative Diagnosis:  Gallstone pancreatitis  Procedure:  Laparoscopic cholecystectomy with intraoperative cholangiogram  Surgeon:  Melvyn Neth, MD  Assistant:  Merlinda Frederick, PA-S  Anesthesia:  General endotracheal  Estimated Blood Loss:  10 ml  Specimens:  gallbladder  Complications:  None  Indications for Procedure:  This is a 78 y.o. male who presented last year with abdominal pain and workup revealing gallstone pancreatitis. He underwent ERCP with initial stent placement and subsequent removal last year as well.  He was lost to follow up but presented because of intermittent biliary colic episodes. The benefits, complications, treatment options, and expected outcomes were discussed with the patient. The risks of bleeding, infection, recurrence of symptoms, failure to resolve symptoms, bile duct damage, bile duct leak, retained common bile duct stone, bowel injury, and need for further procedures were all discussed with the patient and he was willing to proceed.  Description of Procedure: The patient was correctly identified in the preoperative area and brought into the operating room.  The patient was placed supine with VTE prophylaxis in place.  Appropriate time-outs were performed.  Anesthesia was induced and the patient was intubated.  Appropriate antibiotics were infused.  The abdomen was prepped and draped in a sterile fashion. An infraumbilical incision was made. A cutdown technique was used to enter the abdominal cavity without injury, and a Hasson trocar was inserted.  Pneumoperitoneum was obtained with appropriate opening pressures.  A 5-mm port was placed in the subxiphoid area and two 5-mm ports were placed in the right upper quadrant under direct visualization.  The liver had some adhesions to the anterior abdominal wall that were partially taken down with cautery.  The gallbladder  was identified.  The fundus was grasped and retracted cephalad.  Adhesions were lysed bluntly and with electrocautery. The infundibulum was grasped and retracted laterally, exposing the peritoneum overlying the gallbladder.  This was incised with electrocautery and extended on either side of the gallbladder.  The cystic duct and cystic artery were clearly identified and bluntly dissected.  The cystic duct was clipped once distally and a ductotomy was created.  A 14Fr angiocath was placed in the right upper quadrant and the cholangiogram catheter passed through it.  Using [place] forceps, the catheter was introduced into the ductotomy and secured with a clip.  The C-arm was then brought into the field and a cholangiogram was performed showing a patent common bile duct, pancreatic duct, with contrast going into the duodenum.  The right and left hepatic ducts were also visualized.    After completion of the cholangiogram, the catheter was removed and the cystic duct was clipped twice proximally and cut in between.  The cystic artery was clipped and cut in same fashion.  The gallbladder was taken from the gallbladder fossa in a retrograde fashion with electrocautery. The gallbladder was placed in an Endocatch bag. The liver bed was inspected and any bleeding was controlled with electrocautery. The right upper quadrant was then inspected again revealing intact clips, no bleeding, and no ductal injury.  The area was thoroughly irrigated.  The 5 mm ports were removed under direct visualization and the Hasson trocar was removed.  The Endocatch bag and brought out via the umbilical incision.  The fascial opening was closed using 0 vicryl suture.  Local anesthetic was infused in all incisions and the incisions were closed with 4-0 Monocryl.  The wounds were cleaned and sealed with DermaBond.  The patient was emerged from anesthesia and extubated and brought to the recovery room for further management.  The patient  tolerated the procedure well and all counts were correct at the end of the case.   Melvyn Neth, MD

## 2019-01-18 NOTE — Anesthesia Preprocedure Evaluation (Signed)
Anesthesia Evaluation  Patient identified by MRN, date of birth, ID band Patient awake    Reviewed: Allergy & Precautions, H&P , NPO status , Patient's Chart, lab work & pertinent test results, reviewed documented beta blocker date and time   Airway Mallampati: II  TM Distance: >3 FB Neck ROM: full    Dental  (+) Teeth Intact, Edentulous Upper, Edentulous Lower   Pulmonary COPD, former smoker,    Pulmonary exam normal        Cardiovascular Exercise Tolerance: Good hypertension, On Medications + CAD  Normal cardiovascular exam Rhythm:regular Rate:Normal     Neuro/Psych negative neurological ROS  negative psych ROS   GI/Hepatic negative GI ROS, Neg liver ROS,   Endo/Other  Hypothyroidism   Renal/GU Renal disease  negative genitourinary   Musculoskeletal   Abdominal   Peds  Hematology  (+) Blood dyscrasia, anemia ,   Anesthesia Other Findings Past Medical History: No date: Anemia No date: COPD (chronic obstructive pulmonary disease) ([facility]) No date: Coronary artery disease No date: Glaucoma No date: H/O ETOH abuse No date: History of shingles No date: Hyperlipidemia No date: Hypertension No date: Hypothyroidism Past Surgical History: 09/23/2014: COLONOSCOPY WITH PROPOFOL; N/A     Comment:  Procedure: COLONOSCOPY WITH PROPOFOL;  Surgeon: Josefine Class, MD;  Location[facility] ENDOSCOPY;  Service:               Endoscopy;  Laterality: N/A; No date: CORONARY ANGIOPLASTY No date: CORONARY ARTERY BYPASS GRAFT 04/22/2017: ERCP; N/A     Comment:  Procedure: ENDOSCOPIC RETROGRADE               CHOLANGIOPANCREATOGRAPHY (ERCP);  Surgeon: Lucilla Lame,               MD;  Location: [facility] ENDOSCOPY;  Service: Endoscopy;                Laterality: N/A; No date: EYE SURGERY; Bilateral     Comment:  cataract   Reproductive/Obstetrics negative OB ROS                              Anesthesia Physical  Anesthesia Plan  ASA: III  Anesthesia Plan: General ETT   Post-op Pain Management:    Induction: Intravenous, Rapid sequence and Cricoid pressure planned  PONV Risk Score and Plan:   Airway Management Planned: Oral ETT  Additional Equipment: Arterial line  Intra-op Plan:   Post-operative Plan:   Informed Consent: I have reviewed the patients History and Physical, chart, labs and discussed the procedure including the risks, benefits and alternatives for the proposed anesthesia with the patient or authorized representative who has indicated his/her understanding and acceptance.     Dental Advisory Given  Plan Discussed with: CRNA and Surgeon  Anesthesia Plan Comments:         Anesthesia Quick Evaluation

## 2019-01-18 NOTE — Anesthesia Post-op Follow-up Note (Signed)
Anesthesia QCDR form completed.        

## 2019-01-18 NOTE — Anesthesia Postprocedure Evaluation (Signed)
Anesthesia Post Note  Patient: CORBY VANDENBERGHE  Procedure(s) Performed: LAPAROSCOPIC CHOLECYSTECTOMY WITH INTRAOPERATIVE CHOLANGIOGRAM (N/A )  Patient location during evaluation: PACU Anesthesia Type: General Level of consciousness: awake and alert and oriented Pain management: pain level controlled Vital Signs Assessment: post-procedure vital signs reviewed and stable Respiratory status: spontaneous breathing Cardiovascular status: blood pressure returned to baseline Anesthetic complications: no     Last Vitals:  Vitals:   01/18/19 1202 01/18/19 1254  BP: (!) 158/66 (!) 175/43  Pulse: (!) 45 (!) 45  Resp:  18  Temp:    SpO2:  98%    Last Pain:  Vitals:   01/18/19 1153  TempSrc: Temporal  PainSc: 4                  Shaquanda Graves

## 2019-01-18 NOTE — Anesthesia Procedure Notes (Signed)
Procedure Name: Intubation Date/Time: 01/18/2019 9:20 AM Performed by: Caryl Asp, CRNA Pre-anesthesia Checklist: Patient identified, Patient being monitored, Timeout performed, Emergency Drugs available and Suction available Patient Re-evaluated:Patient Re-evaluated prior to induction Oxygen Delivery Method: Circle system utilized Preoxygenation: Pre-oxygenation with 100% oxygen Induction Type: IV induction Ventilation: Mask ventilation without difficulty Laryngoscope Size: 3 and McGraph Grade View: Grade I Tube type: Oral Tube size: 7.0 mm Number of attempts: 1 Airway Equipment and Method: Stylet Placement Confirmation: ETT inserted through vocal cords under direct vision,  positive ETCO2 and breath sounds checked- equal and bilateral Secured at: 21 cm Tube secured with: Tape Dental Injury: Teeth and Oropharynx as per pre-operative assessment

## 2019-01-18 NOTE — Transfer of Care (Signed)
Immediate Anesthesia Transfer of Care Note  Patient: Jason House  Procedure(s) Performed: LAPAROSCOPIC CHOLECYSTECTOMY WITH INTRAOPERATIVE CHOLANGIOGRAM (N/A )  Patient Location: PACU  Anesthesia Type:General  Level of Consciousness: awake  Airway & Oxygen Therapy: Patient Spontanous Breathing  Post-op Assessment: Report given to RN  Post vital signs: stable  Last Vitals:  Vitals Value Taken Time  BP    Temp    Pulse 49 01/18/19 1114  Resp 16 01/18/19 1114  SpO2 98 % 01/18/19 1114  Vitals shown include unvalidated device data.  Last Pain:  Vitals:   01/18/19 0720  TempSrc: Temporal  PainSc: 0-No pain         Complications: No apparent anesthesia complications

## 2019-01-18 NOTE — Discharge Instructions (Signed)

## 2019-01-19 ENCOUNTER — Encounter: Payer: Self-pay | Admitting: Surgery

## 2019-01-19 LAB — SURGICAL PATHOLOGY

## 2019-02-01 ENCOUNTER — Ambulatory Visit (INDEPENDENT_AMBULATORY_CARE_PROVIDER_SITE_OTHER): Payer: Self-pay | Admitting: Physician Assistant

## 2019-02-01 ENCOUNTER — Other Ambulatory Visit: Payer: Self-pay

## 2019-02-01 ENCOUNTER — Encounter: Payer: Self-pay | Admitting: Physician Assistant

## 2019-02-01 VITALS — BP 144/79 | HR 52 | Temp 97.5°F | Ht 65.0 in | Wt 156.2 lb

## 2019-02-01 DIAGNOSIS — Z09 Encounter for follow-up examination after completed treatment for conditions other than malignant neoplasm: Secondary | ICD-10-CM

## 2019-02-01 DIAGNOSIS — K851 Biliary acute pancreatitis without necrosis or infection: Secondary | ICD-10-CM

## 2019-02-01 NOTE — Patient Instructions (Signed)
   Follow-up with our office as needed.  Please call and ask to speak with a nurse if you develop questions or concerns.  

## 2019-02-01 NOTE — Progress Notes (Signed)
[facility] POST-OP OFFICE VISIT  02/01/2019  HPI: Jason House is a 78 y.o. male 14 days s/p laparoscopic cholecystectomy with IOC for gallstone pancreatitis with Dr Hampton Abbot.   Doing well Intermittent abdominal soreness, improved with tylenol No fever, chills, nausea, emesis Tolerating diet, no issues with bowel function No other complaints.    Vital signs: BP (!) 144/79   Pulse (!) 52   Temp (!) 97.5 F (36.4 C) (Temporal)   Ht 5\' 5"  (7.903 m)   Wt 156 lb 3.2 oz (70.9 kg)   SpO2 100%   BMI 25.99 kg/m    Physical Exam: Constitutional: Well appearing male, NAD Abdomen: Soft, non-tender, non-distended Skin: Laparoscopic incisions are CDI with dermabond, no erythema, no drainage  Assessment/Plan: This is a 78 y.o. male 14 days s/p laparoscopic cholecystectomy with IOC for gallstone pancreatitis   - pain control prn  - reviewed wound care  - reviewed lifting restrictions  - reviewed pathology:   - rtc prn, advised to call with questions/concerns  -- Edison Simon, PA-C [facility] 02/01/2019, 10:17 AM [phone] M-F: 7am - 4pm

## 2019-02-08 ENCOUNTER — Ambulatory Visit
Admission: RE | Admit: 2019-02-08 | Discharge: 2019-02-08 | Disposition: A | Discharge status: Home / Self Care | Payer: PPO | Source: Ambulatory Visit | Attending: Gastroenterology | Admitting: Gastroenterology

## 2019-02-08 ENCOUNTER — Other Ambulatory Visit: Payer: Self-pay

## 2019-02-08 DIAGNOSIS — R1310 Dysphagia, unspecified: Secondary | ICD-10-CM | POA: Diagnosis not present

## 2019-02-08 NOTE — Therapy (Signed)
[place], [place], [address] Phone: [phone]   Fax:     Modified Barium Swallow  Patient Details  Name: Jason House MRN: [id number] Date of Birth: 1940/11/04 No data recorded  Encounter Date: 02/08/2019  End of Session - 02/08/19 1322    Visit Number  1    Number of Visits  1    Date for SLP Re-Evaluation  02/08/19       Past Medical History:  Diagnosis Date  . Anemia   . COPD (chronic obstructive pulmonary disease) ([place])   . Coronary artery disease   . Glaucoma   . H/O ETOH abuse   . History of shingles   . Hyperlipidemia   . Hypertension   . Hypothyroidism     Past Surgical History:  Procedure Laterality Date  . CHOLECYSTECTOMY N/A 01/18/2019   Procedure: LAPAROSCOPIC CHOLECYSTECTOMY WITH INTRAOPERATIVE CHOLANGIOGRAM;  Surgeon: Olean Ree, MD;  Location: [facility] ORS;  Service: General;  Laterality: N/A;  . COLONOSCOPY WITH PROPOFOL N/A 09/23/2014   Procedure: COLONOSCOPY WITH PROPOFOL;  Surgeon: Josefine Class, MD;  Location: [facility] ENDOSCOPY;  Service: Endoscopy;  Laterality: N/A;  . CORONARY ANGIOPLASTY    . CORONARY ARTERY BYPASS GRAFT    . ENDARTERECTOMY Right 02/12/2018   Procedure: ENDARTERECTOMY CAROTID;  Surgeon: Algernon Huxley, MD;  Location: [facility] ORS;  Service: Vascular;  Laterality: Right;  . ERCP N/A 04/22/2017   Procedure: ENDOSCOPIC RETROGRADE CHOLANGIOPANCREATOGRAPHY (ERCP);  Surgeon: Lucilla Lame, MD;  Location: [facility] ENDOSCOPY;  Service: Endoscopy;  Laterality: N/A;  . EYE SURGERY Bilateral    cataract    There were no vitals filed for this visit.      Subjective: Patient behavior: (alertness, ability to follow instructions, etc.):  The patient is alert, able to verbalize his swallowing complaints, and follow directions.  Chief complaint: Patient reports symptoms of foods sticking is much better since he began taking smaller bites and chewing  better.   Objective:  Radiological Procedure: A videoflouroscopic evaluation of oral-preparatory, reflex initiation, and pharyngeal phases of the swallow was performed; as well as a screening of the upper esophageal phase.  I. POSTURE: Upright in MBS chair  II. VIEW: Lateral  III. COMPENSATORY STRATEGIES: N/A  IV. BOLUSES ADMINISTERED:   Thin Liquid: 3 cup rim    Nectar-thick Liquid: 1 moderate   Honey-thick Liquid: DNT   Puree: 2 teaspoon presentations   Mechanical Soft: 1/4 graham cracker in applesauce V. RESULTS OF EVALUATION: A. ORAL PREPARATORY PHASE: (The lips, tongue, and velum are observed for strength and coordination)       **Overall Severity Rating: within normal limits   B. SWALLOW INITIATION/REFLEX: (The reflex is normal if "triggered" by the time the bolus reached the base of the tongue)  **Overall Severity Rating: within normal limits   C. PHARYNGEAL PHASE: (Pharyngeal function is normal if the bolus shows rapid, smooth, and continuous transit through the pharynx and there is no pharyngeal residue after the swallow)  **Overall Severity Rating: within normal limits   D. LARYNGEAL PENETRATION: (Material entering into the laryngeal inlet/vestibule but not aspirated) Transient with liquids  E. ASPIRATION: None  F. ESOPHAGEAL PHASE: (Screening of the upper esophagus) Recent barium swallow  ASSESSMENT: This 78 year old man; with complaints of foods sticking (indicates mid-sternum) and recent barium swallow study (esophageal dysmotility and barium tablet momentarily lodging in the valleculae); is presenting with minimal oropharyngeal dysphagia characterized by transient laryngeal penetration, decreased epiglottic inversion, and trace-to-mild  vallecular residue.  Oral control of the bolus including oral hold, rotary mastication, and anterior to posterior transfer is within functional limits.   Timing of pharyngeal swallow initiation is within functional limits.  There  is no observed tracheal aspiration.  The patient is not at significant risk for prandial aspiration.  The patient reports a significant reduction in symptoms since he began taking smaller bites and chewing well.  The patient was counseled to continue to take small bites, chew well, and alternate liquids and solids.  PLAN/RECOMMENDATIONS:   A. Diet: Regular   B. Swallowing Precautions: Chew well, small bites/sips, alternate liquids and solids   C. Recommended consultation to: follow up with MDs as recommended   D. Therapy recommendations: speech therapy is not indicated   E. Results and recommendations were discussed with the patient immediately following the study and the final report routed to the referring practitioner.     Dysphagia, unspecified type - Plan: DG SWALLOW FUNC OP MEDICARE SPEECH PATH, DG SWALLOW FUNC OP MEDICARE SPEECH PATH        Problem List Patient Active Problem List   Diagnosis Date Noted  . Gallstone pancreatitis   . Carotid stenosis, asymptomatic, right 02/12/2018  . Acquired hypothyroidism 07/22/2017  . Calculus of bile duct without cholecystitis and without obstruction   . Encounter for fitting and adjustment of other gastrointestinal appliance and device   . Gram-negative bacteremia 04/18/2017  . Atherosclerotic heart disease of native coronary artery without angina pectoris 04/08/2017  . Chronic kidney disease 04/08/2017  . Glaucoma 04/08/2017  . Choledocholithiasis 03/27/2017  . Jaundice   . Acute pancreatitis   . Bilateral carotid artery stenosis 01/28/2017  . Hyperlipidemia 01/28/2017  . Essential hypertension 01/28/2017  . Anemia 04/22/2012   Leroy Sea, MS/CCC- SLP  Lou Miner 02/08/2019, Edson Snowball PM  [facility] [place], [place], [address] Phone: [phone]   Fax:     Name: Jason House MRN: [id number] Date of Birth: Jun 21, 1940

## 2019-02-22 IMAGING — CT CT ABD-PELV W/ CM
2 of 5 series · 15 of 46 positions shown, 17 images · IV contrast (APPLIED)
Comparison: 06/28/2016

CLINICAL DATA: Upper abdominal pain and chest pain. Elevated
lipase.

EXAM:
CT ABDOMEN AND PELVIS WITH CONTRAST
TECHNIQUE: Multidetector CT imaging of the abdomen and pelvis was performed
using the standard protocol following bolus administration of
intravenous contrast.
CONTRAST:  75mL [id number] IOPAMIDOL ([id number]) INJECTION 61%

[Series 2: routine abd/pel with · axial · 0.72mm/px · z∈[-565,-165]mm · 12 of 90 slices shown, 14 images]
[im 5/90  soft-tissue]
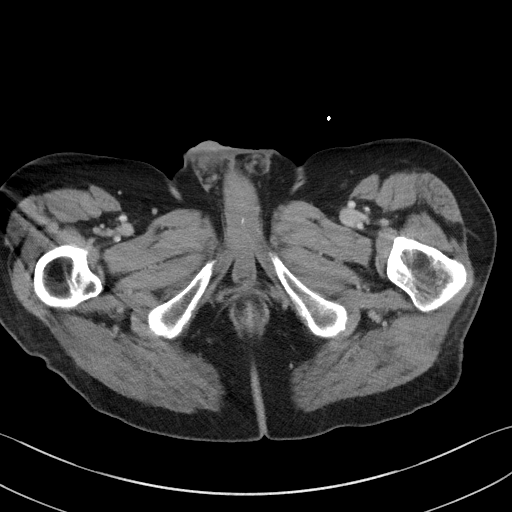
[im 5/90  bone]
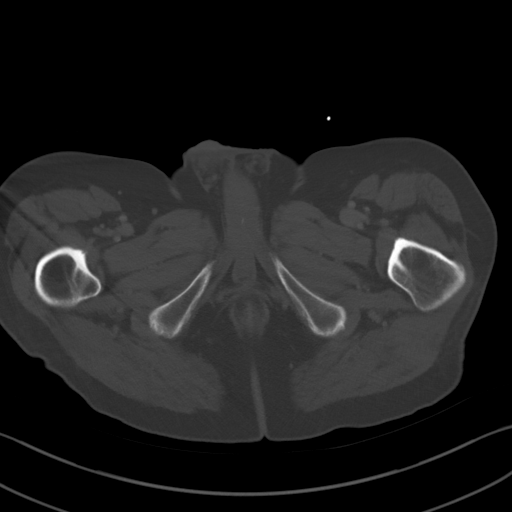
[im 15/90  soft-tissue]
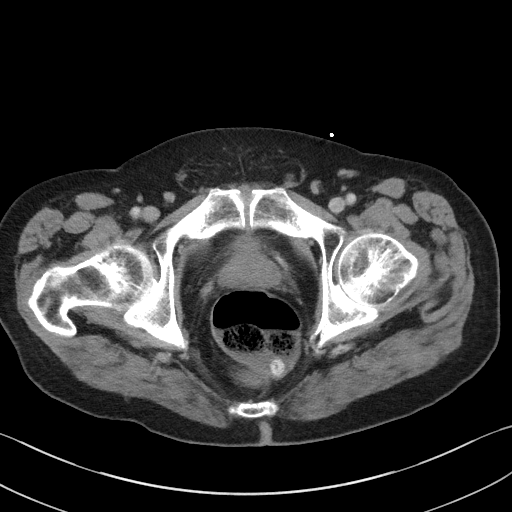
[im 19/90  soft-tissue]
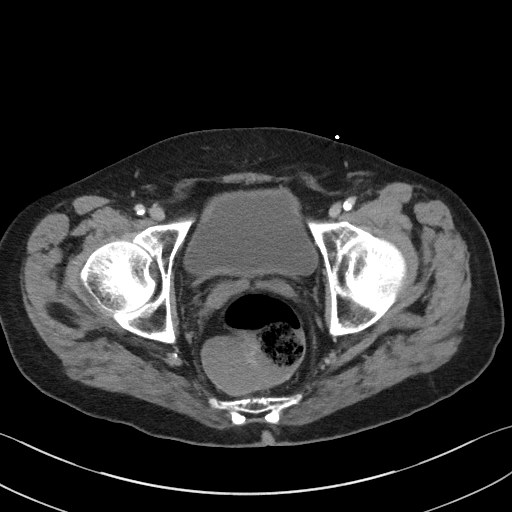
[im 29/90  soft-tissue]
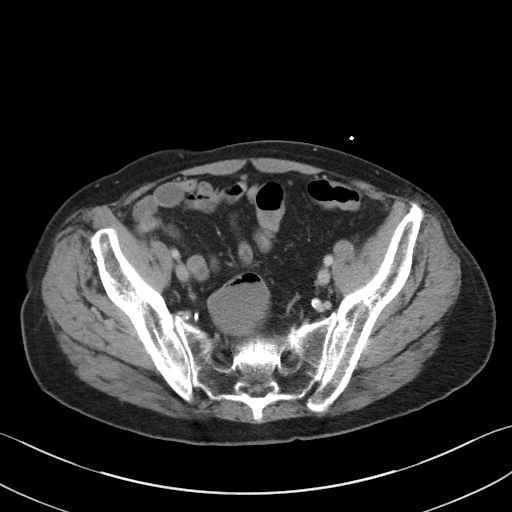
[im 33/90  soft-tissue]
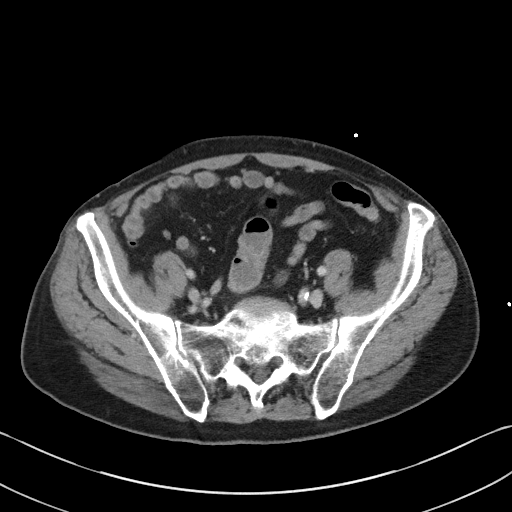
[im 43/90  soft-tissue]
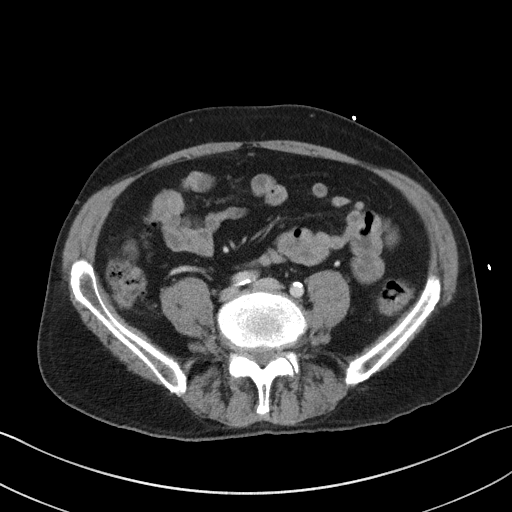
[im 47/90  soft-tissue]
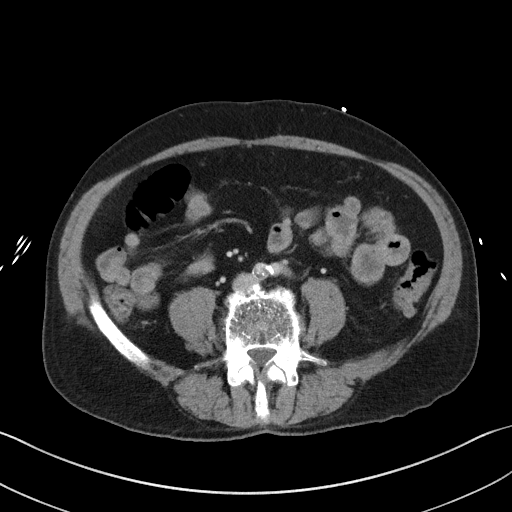
[im 57/90  soft-tissue]
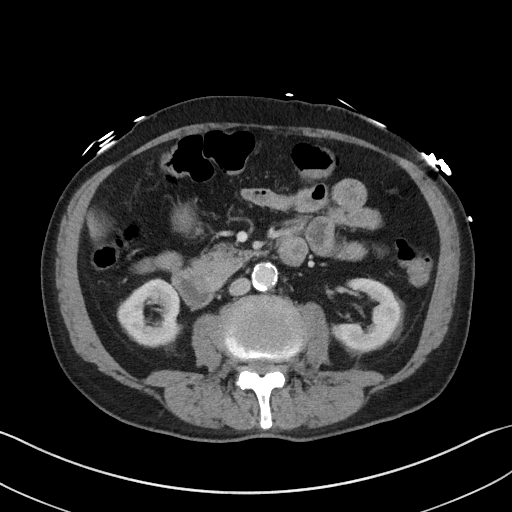
[im 61/90  soft-tissue]
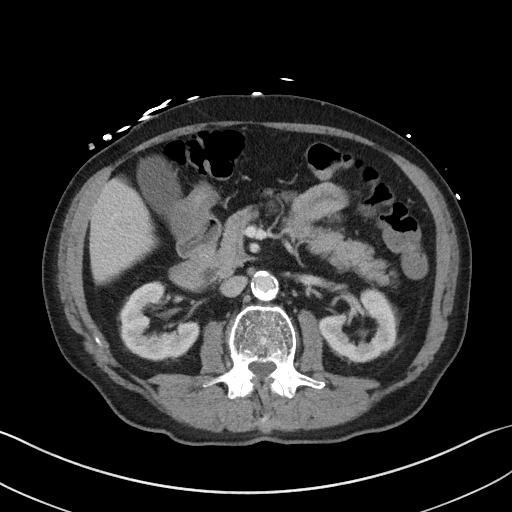
[im 61/90  bone]
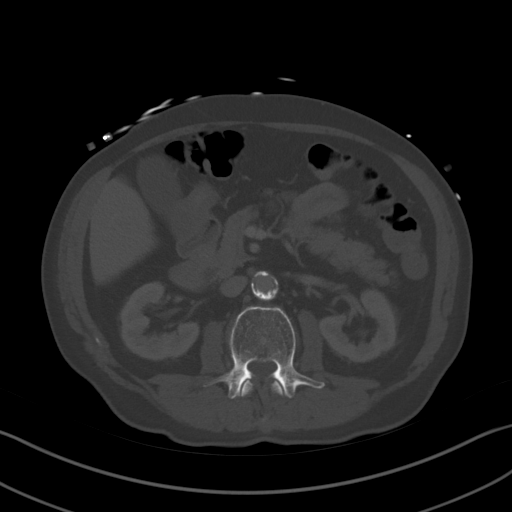
[im 71/90  soft-tissue]
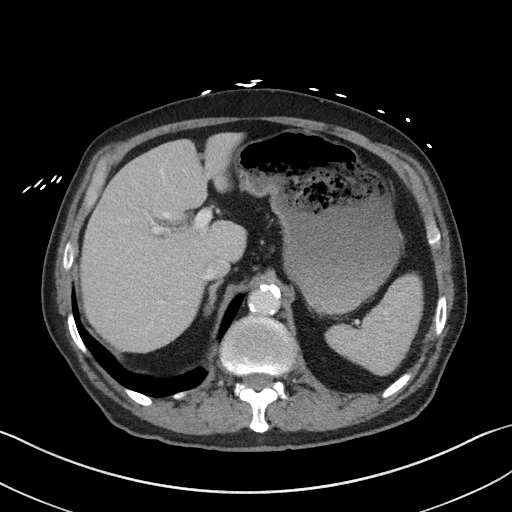
[im 75/90  soft-tissue]
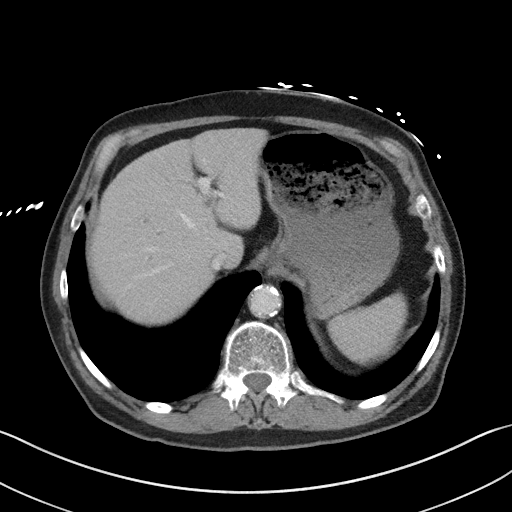
[im 85/90  soft-tissue]
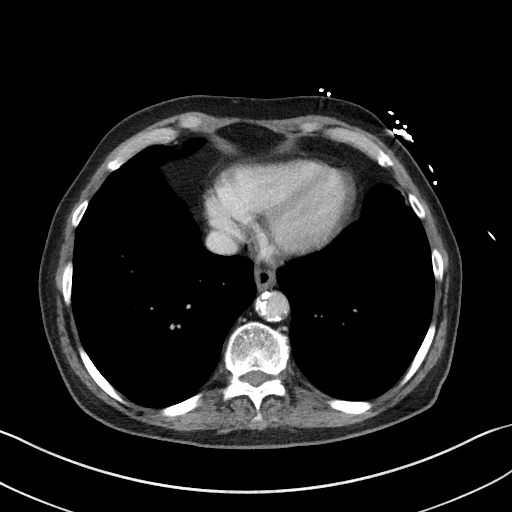

[Series 5: coronal st · coronal · 0.73mm/px · 3 of 81 slices shown]
[im 27/81  soft-tissue]
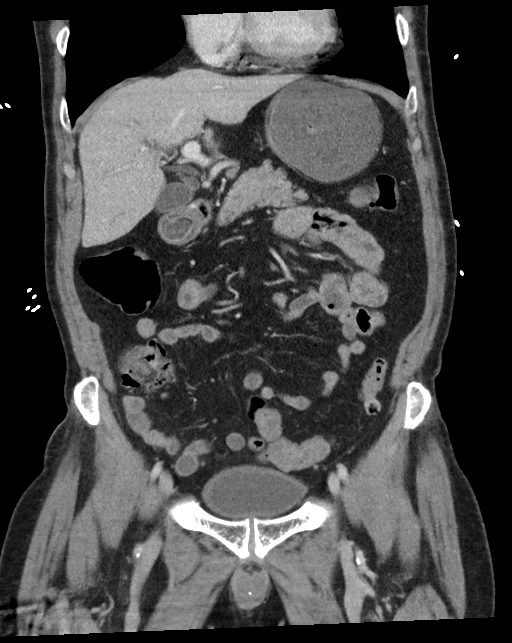
[im 36/81  soft-tissue]
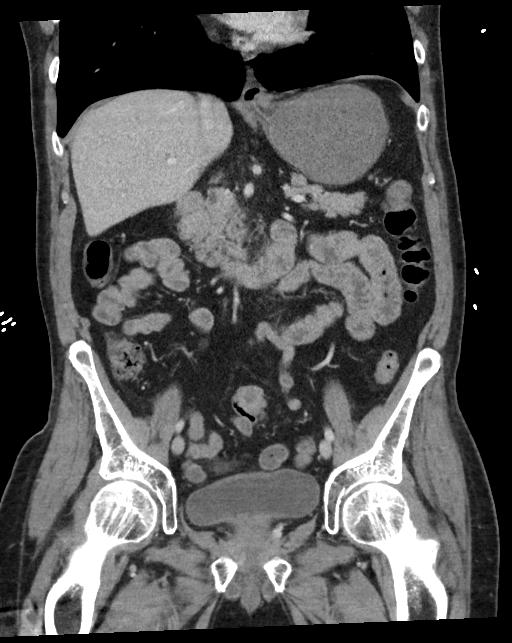
[im 45/81  soft-tissue]
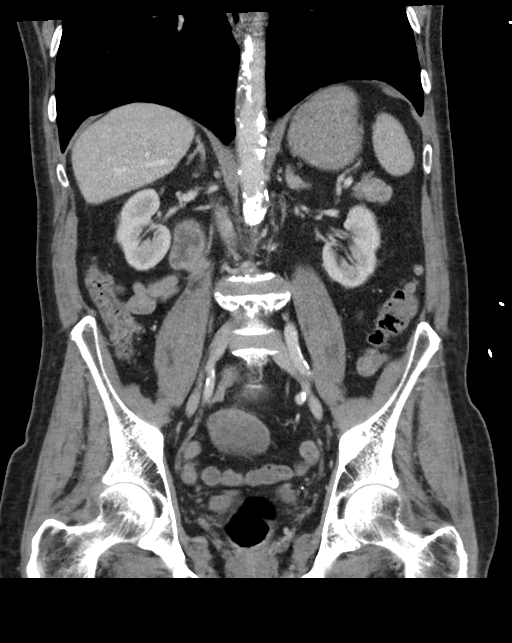

[15 of 46 positions shown; findings below may reference images not displayed]

FINDINGS: Lower chest: Small nodular densities at the left lung base, largest
measuring 4 mm on sequence 4, image 11. Findings are similar to the
previous examination. No pleural effusions.

Hepatobiliary: Mild periportal edema is nonspecific. No suspicious
liver lesions. Mild distention of the gallbladder without
inflammatory changes. The common bile duct is mildly dilated near
the ampulla measuring up to 9 mm and previously measured 6 mm. No
definite stones or obstructing lesion in the distal common bile
duct.

Pancreas: Normal appearance of the pancreas without inflammation or
duct dilatation.

Spleen: Normal appearance of spleen without enlargement.

Adrenals/Urinary Tract: Normal adrenal glands. Normal appearance of
both kidneys without hydronephrosis. Urinary bladder is
unremarkable.

Stomach/Bowel: Small hiatal hernia. Appendix is normal. Scattered
colonic diverticula without acute inflammatory changes. No evidence
for bowel obstruction. Rectum is distended with fluid and stool-like
material.

Vascular/Lymphatic: Again noted is the extensive atherosclerotic
disease involving the distal descending thoracic aorta and the
abdominal aorta. There flow in the celiac trunk and SMA. Origin of
the IMA is probably occluded with distal reconstitution. Again noted
is critical stenosis of the proximal right common iliac artery. No
lymph node enlargement in the abdomen or pelvis.

Reproductive: Prostate is unremarkable.

Other: No free fluid.  Negative for free air.

Musculoskeletal: No acute bone abnormality.
IMPRESSION: No acute abnormality within the abdomen or pelvis.

Slightly increased dilatation of the distal common bile duct is
nonspecific. Recommend correlation with liver function tests. If
there is clinical concern for choledocholithiasis, consider further
characterization with MRCP.

Severe atherosclerotic disease involving the aorta and the proximal
right common iliac artery. Patient likely has hemodynamically
significant stenosis involving the proximal right common iliac
artery. These findings are similar to the prior examination.

Again noted are small nodules at the lung bases. No follow-up needed
if patient is low-risk (and has no known or suspected primary
neoplasm). Non-contrast chest CT can be considered in 12 months if
patient is high-risk. This recommendation follows the consensus
statement: Guidelines for Management of Incidental Pulmonary Nodules
Detected on CT Images: From the [HOSPITAL] 0450; Radiology

## 2019-02-22 IMAGING — CR DG ABDOMEN ACUTE W/ 1V CHEST
3 series · 4 of 4 positions shown · non-contrast
Comparison: CT 06/28/2016.  Chest x-ray report 06/13/2016.

CLINICAL DATA: Abdominal pain.

EXAM:
DG ABDOMEN ACUTE W/ 1V CHEST

[Series 1: chest pa · 0.14mm/px · 2 of 2 slices shown]
[im 1/2]
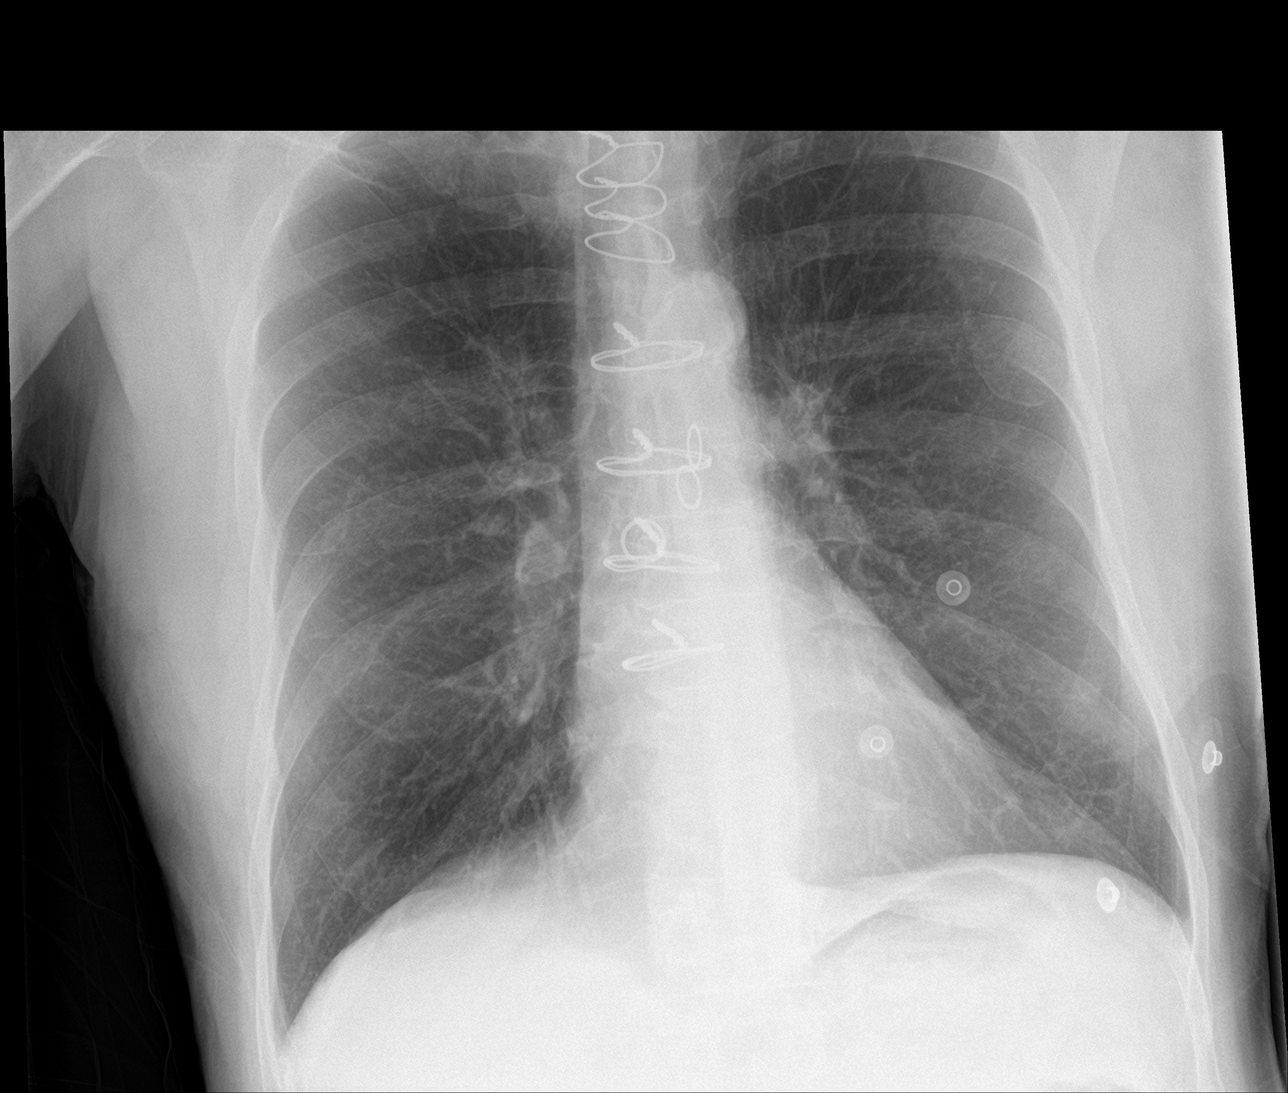
[im 2/2]
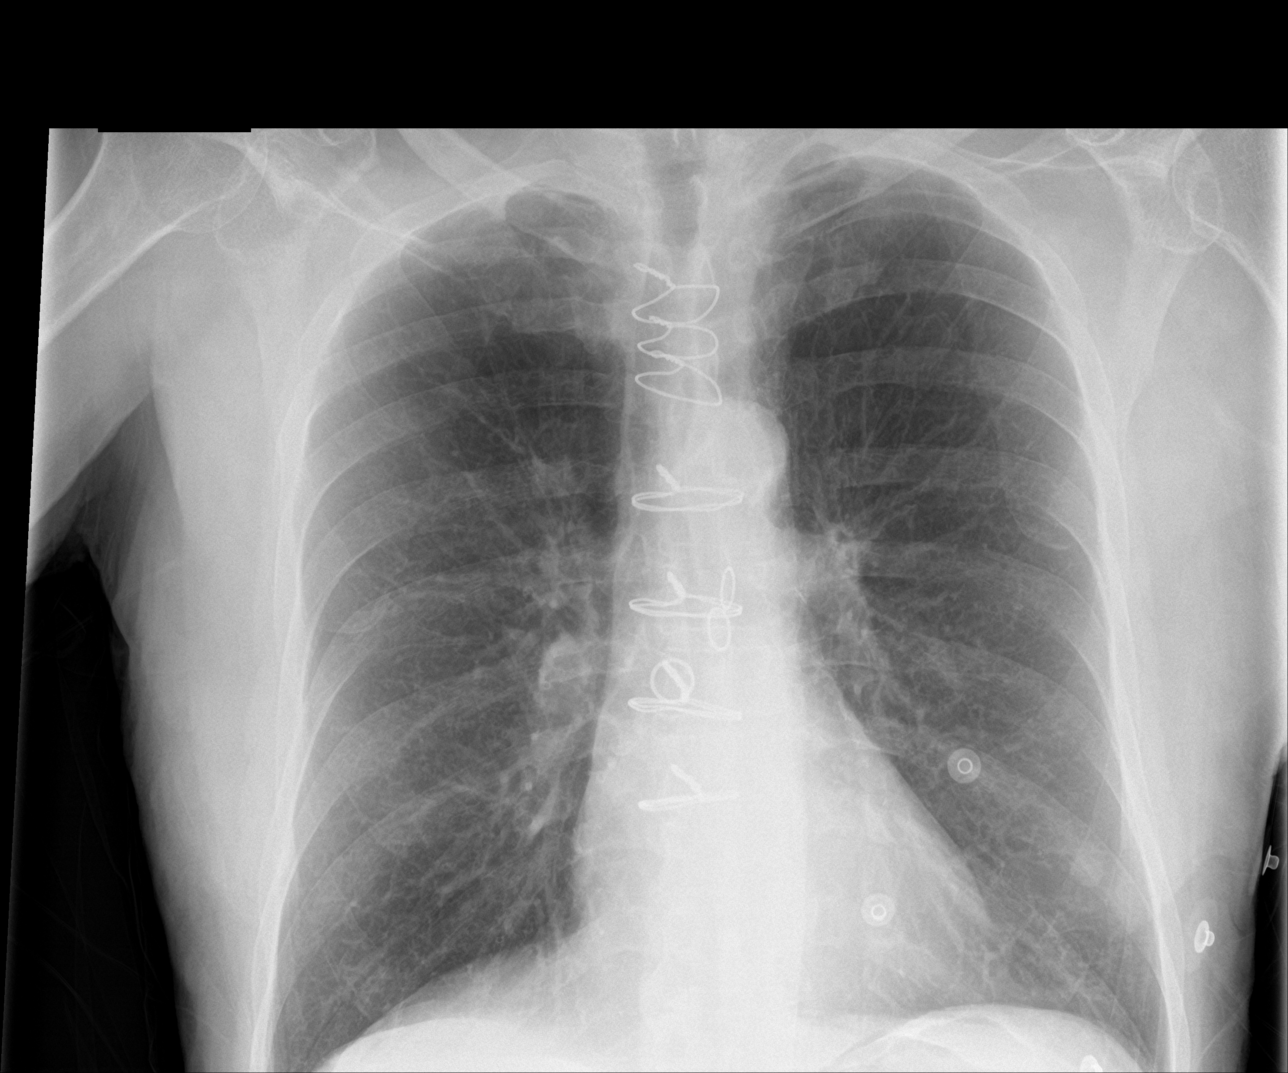

[abdomen erect]
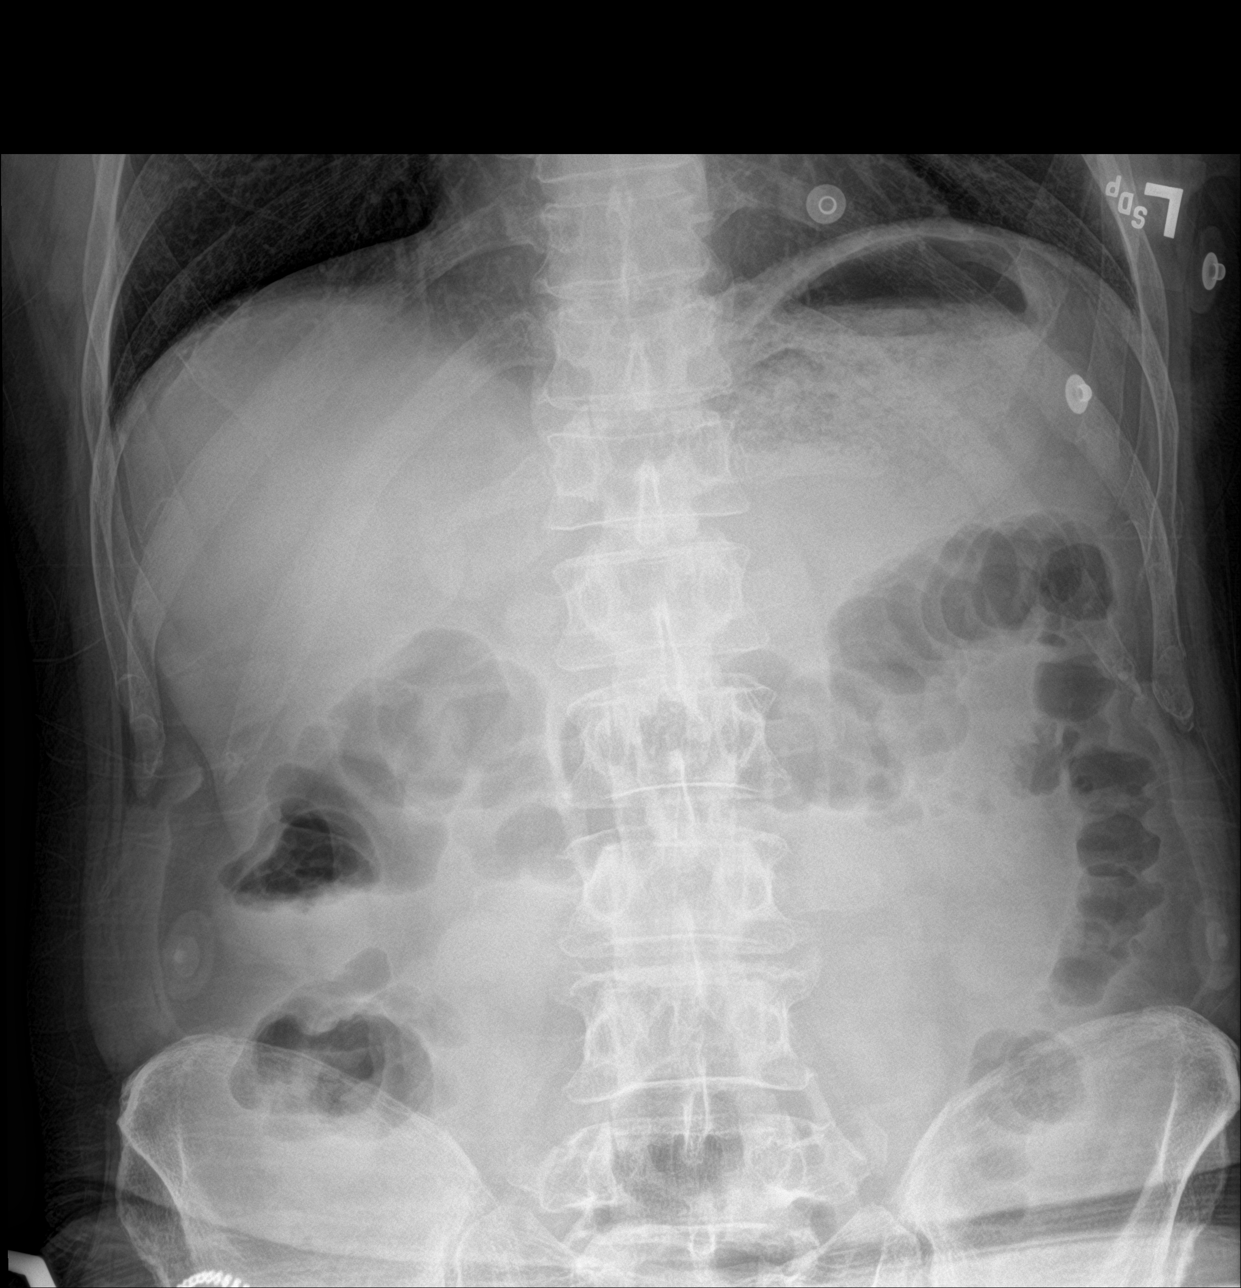

[abdomen supine]
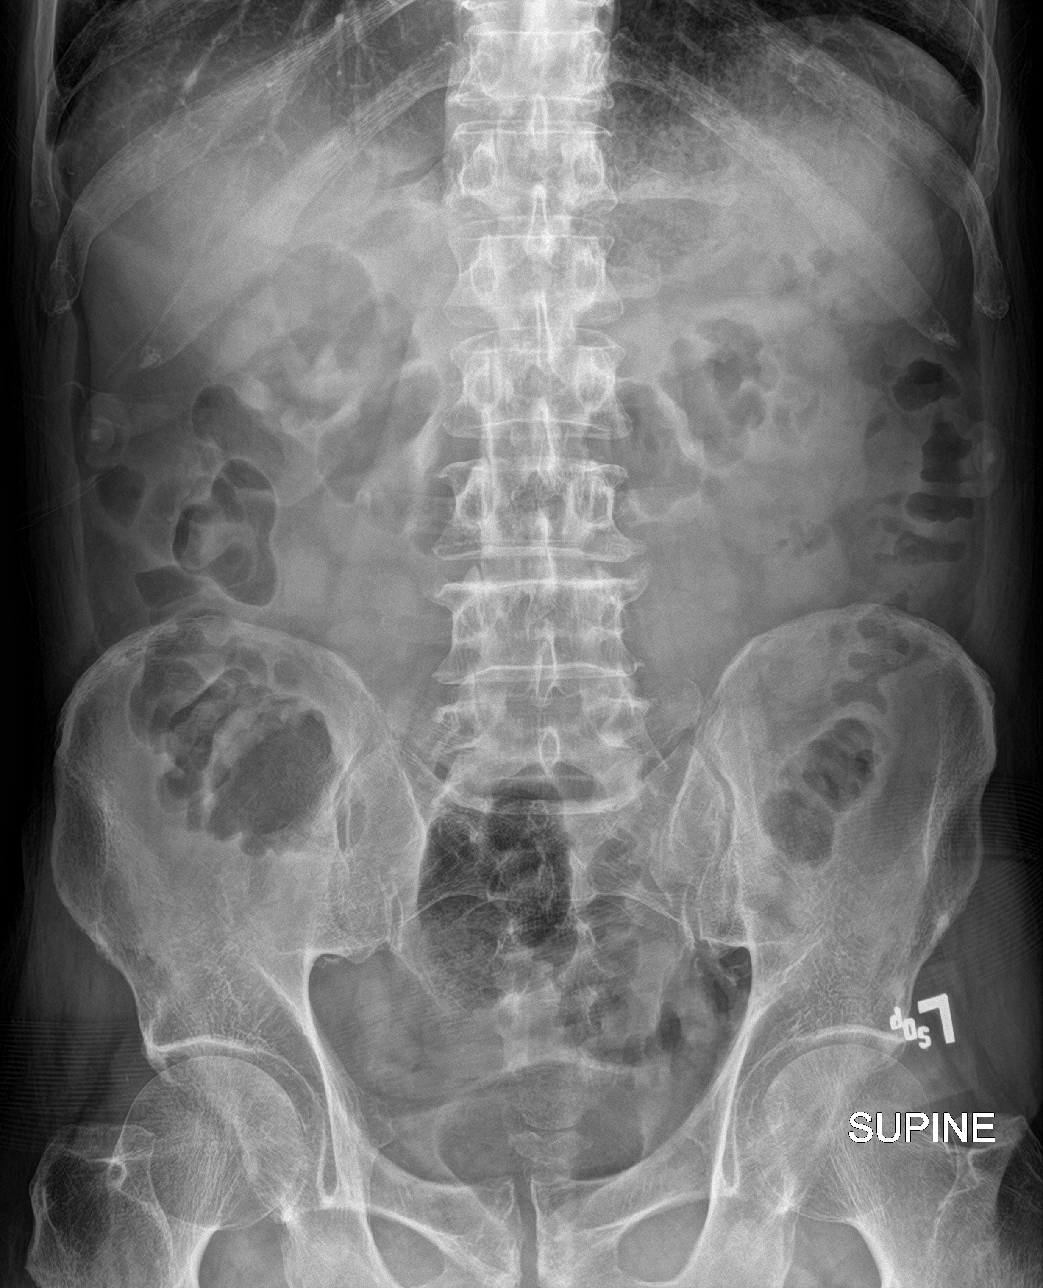

[4 of 4 positions shown; findings below may reference images not displayed]

FINDINGS: Prior CABG. Heart size normal nipple shadow noted over the left lung
base. No acute cardiopulmonary disease identified. Soft tissues the
abdomen are unremarkable. No bowel distention. Scattered air-fluid
levels noted in the colon. Diarrheal illness could present this
fashion. No evidence of bowel obstruction or free air. Diffuse
osteopenia and degenerative change. No acute bony abnormality.
IMPRESSION: 1.  No acute cardiopulmonary disease.  Prior CABG.

2. Scattered air-fluid levels noted in the colon. Diarrheal illness
could present this fashion. There is no evidence of bowel distention
or free air.

## 2019-02-23 DIAGNOSIS — Z79899 Other long term (current) drug therapy: Secondary | ICD-10-CM | POA: Diagnosis not present

## 2019-02-23 DIAGNOSIS — I10 Essential (primary) hypertension: Secondary | ICD-10-CM | POA: Diagnosis not present

## 2019-02-23 DIAGNOSIS — E782 Mixed hyperlipidemia: Secondary | ICD-10-CM | POA: Diagnosis not present

## 2019-02-23 DIAGNOSIS — E039 Hypothyroidism, unspecified: Secondary | ICD-10-CM | POA: Diagnosis not present

## 2019-03-02 DIAGNOSIS — D649 Anemia, unspecified: Secondary | ICD-10-CM | POA: Diagnosis not present

## 2019-03-02 DIAGNOSIS — E039 Hypothyroidism, unspecified: Secondary | ICD-10-CM | POA: Diagnosis not present

## 2019-03-02 DIAGNOSIS — I739 Peripheral vascular disease, unspecified: Secondary | ICD-10-CM | POA: Diagnosis not present

## 2019-03-02 DIAGNOSIS — Z79899 Other long term (current) drug therapy: Secondary | ICD-10-CM | POA: Diagnosis not present

## 2019-03-02 DIAGNOSIS — I10 Essential (primary) hypertension: Secondary | ICD-10-CM | POA: Diagnosis not present

## 2019-03-02 DIAGNOSIS — E782 Mixed hyperlipidemia: Secondary | ICD-10-CM | POA: Diagnosis not present

## 2019-03-29 DIAGNOSIS — D649 Anemia, unspecified: Secondary | ICD-10-CM | POA: Diagnosis not present

## 2019-03-29 DIAGNOSIS — H401121 Primary open-angle glaucoma, left eye, mild stage: Secondary | ICD-10-CM | POA: Diagnosis not present

## 2019-04-05 DIAGNOSIS — H401121 Primary open-angle glaucoma, left eye, mild stage: Secondary | ICD-10-CM | POA: Diagnosis not present

## 2019-04-12 DIAGNOSIS — E782 Mixed hyperlipidemia: Secondary | ICD-10-CM | POA: Diagnosis not present

## 2019-04-12 DIAGNOSIS — I25810 Atherosclerosis of coronary artery bypass graft(s) without angina pectoris: Secondary | ICD-10-CM | POA: Diagnosis not present

## 2019-04-12 DIAGNOSIS — I2510 Atherosclerotic heart disease of native coronary artery without angina pectoris: Secondary | ICD-10-CM | POA: Diagnosis not present

## 2019-04-12 DIAGNOSIS — I10 Essential (primary) hypertension: Secondary | ICD-10-CM | POA: Diagnosis not present

## 2019-04-12 DIAGNOSIS — I7389 Other specified peripheral vascular diseases: Secondary | ICD-10-CM | POA: Diagnosis not present

## 2019-04-12 DIAGNOSIS — I340 Nonrheumatic mitral (valve) insufficiency: Secondary | ICD-10-CM | POA: Diagnosis not present

## 2019-05-07 ENCOUNTER — Encounter: Payer: Self-pay | Admitting: Emergency Medicine

## 2019-05-07 ENCOUNTER — Other Ambulatory Visit: Payer: Self-pay

## 2019-05-07 ENCOUNTER — Inpatient Hospital Stay
Admission: EM | Admit: 2019-05-07 | Discharge: 2019-05-09 | DRG: 669 | Disposition: A | Discharge status: Home / Self Care | Payer: PPO | Attending: Internal Medicine | Admitting: Internal Medicine

## 2019-05-07 DIAGNOSIS — E039 Hypothyroidism, unspecified: Secondary | ICD-10-CM | POA: Diagnosis present

## 2019-05-07 DIAGNOSIS — J449 Chronic obstructive pulmonary disease, unspecified: Secondary | ICD-10-CM | POA: Diagnosis present

## 2019-05-07 DIAGNOSIS — Z79890 Hormone replacement therapy (postmenopausal): Secondary | ICD-10-CM | POA: Diagnosis not present

## 2019-05-07 DIAGNOSIS — Z8249 Family history of ischemic heart disease and other diseases of the circulatory system: Secondary | ICD-10-CM | POA: Diagnosis not present

## 2019-05-07 DIAGNOSIS — Z79899 Other long term (current) drug therapy: Secondary | ICD-10-CM | POA: Diagnosis not present

## 2019-05-07 DIAGNOSIS — I2510 Atherosclerotic heart disease of native coronary artery without angina pectoris: Secondary | ICD-10-CM | POA: Diagnosis present

## 2019-05-07 DIAGNOSIS — D62 Acute posthemorrhagic anemia: Secondary | ICD-10-CM | POA: Diagnosis present

## 2019-05-07 DIAGNOSIS — R319 Hematuria, unspecified: Secondary | ICD-10-CM | POA: Diagnosis present

## 2019-05-07 DIAGNOSIS — R3989 Other symptoms and signs involving the genitourinary system: Secondary | ICD-10-CM | POA: Diagnosis not present

## 2019-05-07 DIAGNOSIS — Z825 Family history of asthma and other chronic lower respiratory diseases: Secondary | ICD-10-CM | POA: Diagnosis not present

## 2019-05-07 DIAGNOSIS — Z7982 Long term (current) use of aspirin: Secondary | ICD-10-CM | POA: Diagnosis not present

## 2019-05-07 DIAGNOSIS — D494 Neoplasm of unspecified behavior of bladder: Principal | ICD-10-CM | POA: Diagnosis present

## 2019-05-07 DIAGNOSIS — Z951 Presence of aortocoronary bypass graft: Secondary | ICD-10-CM

## 2019-05-07 DIAGNOSIS — Z03818 Encounter for observation for suspected exposure to other biological agents ruled out: Secondary | ICD-10-CM | POA: Diagnosis not present

## 2019-05-07 DIAGNOSIS — R310 Gross hematuria: Secondary | ICD-10-CM

## 2019-05-07 DIAGNOSIS — H409 Unspecified glaucoma: Secondary | ICD-10-CM | POA: Diagnosis present

## 2019-05-07 DIAGNOSIS — E785 Hyperlipidemia, unspecified: Secondary | ICD-10-CM | POA: Diagnosis present

## 2019-05-07 DIAGNOSIS — Z20822 Contact with and (suspected) exposure to covid-19: Secondary | ICD-10-CM | POA: Diagnosis present

## 2019-05-07 DIAGNOSIS — N1831 Chronic kidney disease, stage 3a: Secondary | ICD-10-CM | POA: Diagnosis not present

## 2019-05-07 DIAGNOSIS — Z87891 Personal history of nicotine dependence: Secondary | ICD-10-CM | POA: Diagnosis not present

## 2019-05-07 DIAGNOSIS — I129 Hypertensive chronic kidney disease with stage 1 through stage 4 chronic kidney disease, or unspecified chronic kidney disease: Secondary | ICD-10-CM | POA: Diagnosis present

## 2019-05-07 DIAGNOSIS — N3289 Other specified disorders of bladder: Secondary | ICD-10-CM | POA: Diagnosis not present

## 2019-05-07 DIAGNOSIS — N1832 Chronic kidney disease, stage 3b: Secondary | ICD-10-CM | POA: Diagnosis present

## 2019-05-07 DIAGNOSIS — I10 Essential (primary) hypertension: Secondary | ICD-10-CM

## 2019-05-07 DIAGNOSIS — Z7902 Long term (current) use of antithrombotics/antiplatelets: Secondary | ICD-10-CM | POA: Diagnosis not present

## 2019-05-07 DIAGNOSIS — I739 Peripheral vascular disease, unspecified: Secondary | ICD-10-CM | POA: Diagnosis present

## 2019-05-07 DIAGNOSIS — N029 Recurrent and persistent hematuria with unspecified morphologic changes: Secondary | ICD-10-CM | POA: Diagnosis not present

## 2019-05-07 LAB — CBC
HCT: 33.6 % — ABNORMAL LOW (ref 39.0–52.0)
Hemoglobin: 11.5 g/dL — ABNORMAL LOW (ref 13.0–17.0)
MCH: 32.9 pg (ref 26.0–34.0)
MCHC: 34.2 g/dL (ref 30.0–36.0)
MCV: 96 fL (ref 80.0–100.0)
Platelets: 225 10*3/uL (ref 150–400)
RBC: 3.5 MIL/uL — ABNORMAL LOW (ref 4.22–5.81)
RDW: 12.7 % (ref 11.5–15.5)
WBC: 11.5 10*3/uL — ABNORMAL HIGH (ref 4.0–10.5)
nRBC: 0 % (ref 0.0–0.2)

## 2019-05-07 LAB — BASIC METABOLIC PANEL
Anion gap: 6 (ref 5–15)
BUN: 29 mg/dL — ABNORMAL HIGH (ref 8–23)
CO2: 27 mmol/L (ref 22–32)
Calcium: 9.2 mg/dL (ref 8.9–10.3)
Chloride: 99 mmol/L (ref 98–111)
Creatinine, Ser: 1.48 mg/dL — ABNORMAL HIGH (ref 0.61–1.24)
GFR calc Af Amer: 51 mL/min — ABNORMAL LOW (ref >60)
GFR calc non Af Amer: 44 mL/min — ABNORMAL LOW (ref >60)
Glucose, Bld: 93 mg/dL (ref 70–99)
Potassium: 4.4 mmol/L (ref 3.5–5.1)
Sodium: 132 mmol/L — ABNORMAL LOW (ref 135–145)

## 2019-05-07 LAB — URINALYSIS, COMPLETE (UACMP) WITH MICROSCOPIC
Bacteria, UA: NONE SEEN
RBC / HPF: >50 RBC/hpf (ref 0–5)
Specific Gravity, Urine: 1.015 (ref 1.005–1.030)
Squamous Epithelial / HPF: NONE SEEN (ref 0–5)
WBC, UA: NONE SEEN WBC/hpf (ref 0–5)

## 2019-05-07 LAB — RESPIRATORY PANEL BY RT PCR (FLU A&B, COVID)
Influenza A by PCR: NEGATIVE
Influenza B by PCR: NEGATIVE
SARS Coronavirus 2 by RT PCR: NEGATIVE

## 2019-05-07 MED ORDER — ACETAMINOPHEN 325 MG PO TABS: 650.0000 mg | ORAL_TABLET | Freq: Four times a day (QID) | ORAL | Status: DC | PRN

## 2019-05-07 MED ORDER — LIDOCAINE HCL URETHRAL/MUCOSAL 2 % EX GEL
1.0000 "application " | Freq: Once | CUTANEOUS | Status: AC
  Administered 2019-05-07: 1 via URETHRAL
  Filled 2019-05-07: qty 10

## 2019-05-07 MED ORDER — SODIUM CHLORIDE 0.9 % IV BOLUS
500.0000 mL | Freq: Once | INTRAVENOUS | Status: AC
  Administered 2019-05-07: 500 mL via INTRAVENOUS

## 2019-05-07 MED ORDER — ONDANSETRON HCL 4 MG/2ML IJ SOLN: 4.0000 mg | Freq: Four times a day (QID) | INTRAMUSCULAR | Status: DC | PRN

## 2019-05-07 MED ORDER — NEBIVOLOL HCL 5 MG PO TABS
5.0000 mg | ORAL_TABLET | Freq: Every day | ORAL | Status: DC
  Administered 2019-05-08 – 2019-05-09 (×2): 5 mg via ORAL
  Filled 2019-05-07 (×3): qty 1

## 2019-05-07 MED ORDER — ACETAMINOPHEN 650 MG RE SUPP: 650.0000 mg | Freq: Four times a day (QID) | RECTAL | Status: DC | PRN

## 2019-05-07 MED ORDER — CYCLOBENZAPRINE HCL 10 MG PO TABS: 10.0000 mg | ORAL_TABLET | Freq: Every day | ORAL | Status: DC | PRN

## 2019-05-07 MED ORDER — RANOLAZINE ER 500 MG PO TB12
1000.0000 mg | ORAL_TABLET | Freq: Two times a day (BID) | ORAL | Status: DC
  Administered 2019-05-07 – 2019-05-08 (×3): 1000 mg via ORAL
  Filled 2019-05-07 (×4): qty 2

## 2019-05-07 MED ORDER — ALBUTEROL SULFATE (2.5 MG/3ML) 0.083% IN NEBU: 2.5000 mg | INHALATION_SOLUTION | RESPIRATORY_TRACT | Status: DC | PRN

## 2019-05-07 MED ORDER — SODIUM CHLORIDE 0.9 % IR SOLN
3000.0000 mL | Status: DC
  Administered 2019-05-07 (×2): 3000 mL

## 2019-05-07 MED ORDER — SODIUM CHLORIDE 0.9 % IV SOLN: INTRAVENOUS | Status: DC

## 2019-05-07 MED ORDER — LISINOPRIL 10 MG PO TABS
10.0000 mg | ORAL_TABLET | Freq: Every day | ORAL | Status: DC
  Administered 2019-05-08 – 2019-05-09 (×2): 10 mg via ORAL
  Filled 2019-05-07 (×2): qty 1

## 2019-05-07 MED ORDER — VITAMIN B-12 1000 MCG PO TABS
1000.0000 ug | ORAL_TABLET | Freq: Every day | ORAL | Status: DC
  Administered 2019-05-08 – 2019-05-09 (×2): 1000 ug via ORAL
  Filled 2019-05-07 (×2): qty 1

## 2019-05-07 MED ORDER — ATORVASTATIN CALCIUM 20 MG PO TABS
80.0000 mg | ORAL_TABLET | Freq: Every evening | ORAL | Status: DC
  Administered 2019-05-07 – 2019-05-08 (×2): 80 mg via ORAL
  Filled 2019-05-07 (×2): qty 4

## 2019-05-07 MED ORDER — ONDANSETRON HCL 4 MG PO TABS: 4.0000 mg | ORAL_TABLET | Freq: Four times a day (QID) | ORAL | Status: DC | PRN

## 2019-05-07 MED ORDER — DORZOLAMIDE HCL-TIMOLOL MAL 2-0.5 % OP SOLN
1.0000 [drp] | Freq: Two times a day (BID) | OPHTHALMIC | Status: DC
  Administered 2019-05-07 – 2019-05-09 (×4): 1 [drp] via OPHTHALMIC
  Filled 2019-05-07: qty 10

## 2019-05-07 MED ORDER — LEVOTHYROXINE SODIUM 50 MCG PO TABS
50.0000 ug | ORAL_TABLET | Freq: Every day | ORAL | Status: DC
  Administered 2019-05-08 – 2019-05-09 (×2): 50 ug via ORAL
  Filled 2019-05-07 (×2): qty 1

## 2019-05-07 MED ORDER — LATANOPROST 0.005 % OP SOLN
1.0000 [drp] | Freq: Every day | OPHTHALMIC | Status: DC
  Administered 2019-05-07 – 2019-05-08 (×2): 1 [drp] via OPHTHALMIC
  Filled 2019-05-07: qty 2.5

## 2019-05-07 MED ORDER — HYDROCHLOROTHIAZIDE 25 MG PO TABS
12.5000 mg | ORAL_TABLET | Freq: Every evening | ORAL | Status: DC
  Administered 2019-05-07 – 2019-05-08 (×2): 12.5 mg via ORAL
  Filled 2019-05-07 (×2): qty 1

## 2019-05-07 NOTE — Progress Notes (Signed)
Pt admitted from the ED.  Denies pain.  BP on admission 175/63. BP med given. Rest of VSS.  Pt on CBI. Output via foley pink with clots.

## 2019-05-07 NOTE — H&P (Signed)
[place] [facility] [place] NAME: Jason House    MR#:  [id number]  DATE OF BIRTH:  [birth year]  DATE OF ADMISSION:  05/07/2019  PRIMARY CARE PHYSICIAN: Idelle Crouch, MD   REQUESTING/REFERRING PHYSICIAN: Dr Maisie Fus  CHIEF COMPLAINT:   Chief Complaint  Patient presents with  . Hematuria    HISTORY OF PRESENT ILLNESS:  Jason House  is a 79 y.o. male coming in with hematuria starting this morning.  He woke up at 4:30 AM and saw a little blood but then at 6:30 AM it was pure bright red blood.  The patient states he is also had some nosebleeds every day.  He has a little abdominal pain but recently had his gallbladder removed.  No back pain.  He states he had no problem urinating prior to this episode.  Patient still having hematuria despite a few hours of continuous bladder irrigation.  Hospitalist services were contacted for further evaluation.  PAST MEDICAL HISTORY:   Past Medical History:  Diagnosis Date  . Anemia   . COPD (chronic obstructive pulmonary disease) ([place])   . Coronary artery disease   . Glaucoma   . H/O ETOH abuse   . History of shingles   . Hyperlipidemia   . Hypertension   . Hypothyroidism     PAST SURGICAL HISTORY:   Past Surgical History:  Procedure Laterality Date  . CHOLECYSTECTOMY N/A 01/18/2019   Procedure: LAPAROSCOPIC CHOLECYSTECTOMY WITH INTRAOPERATIVE CHOLANGIOGRAM;  Surgeon: Olean Ree, MD;  Location: [facility] ORS;  Service: General;  Laterality: N/A;  . COLONOSCOPY WITH PROPOFOL N/A 09/23/2014   Procedure: COLONOSCOPY WITH PROPOFOL;  Surgeon: Josefine Class, MD;  Location: [facility] ENDOSCOPY;  Service: Endoscopy;  Laterality: N/A;  . CORONARY ANGIOPLASTY    . CORONARY ARTERY BYPASS GRAFT    . ENDARTERECTOMY Right 02/12/2018   Procedure: ENDARTERECTOMY CAROTID;  Surgeon: Algernon Huxley, MD;  Location: [facility] ORS;  Service: Vascular;  Laterality: Right;  . ERCP N/A 04/22/2017   Procedure: ENDOSCOPIC  RETROGRADE CHOLANGIOPANCREATOGRAPHY (ERCP);  Surgeon: Lucilla Lame, MD;  Location: [facility] ENDOSCOPY;  Service: Endoscopy;  Laterality: N/A;  . EYE SURGERY Bilateral    cataract    SOCIAL HISTORY:   Social History   Tobacco Use  . Smoking status: Former Research scientist (life sciences)  . Smokeless tobacco: Never Used  . Tobacco comment: 30 years ago  Substance Use Topics  . Alcohol use: No    Comment: stopped 2010    FAMILY HISTORY:   Family History  Problem Relation Age of Onset  . Diabetes Mother   . CAD Mother   . Cancer Sister   . Throat cancer Brother   . Cirrhosis Brother   . COPD Sister   . COPD Sister   . Diabetes Sister     DRUG ALLERGIES:  No Known Allergies  REVIEW OF SYSTEMS:  CONSTITUTIONAL: No fever, chills or sweats.  Fatigue or weakness.  EYES: No blurred or double vision.  EARS, NOSE, AND THROAT: No tinnitus or ear pain. No sore throat.  Decreased hearing.  Positive for epistaxis RESPIRATORY: No cough.always has shortness of breath.  CARDIOVASCULAR: Occasional chest pain.  GASTROINTESTINAL: No nausea, vomiting, diarrhea.  Some abdominal pain. No blood in bowel movements GENITOURINARY: Positive hematuria.  No trouble getting his urine out prior to the hematuria. ENDOCRINE: No polyuria, nocturia,  HEMATOLOGY: No anemia SKIN: No rash or lesion. MUSCULOSKELETAL: No joint pain or arthritis.   NEUROLOGIC: No tingling, numbness, weakness.  PSYCHIATRY: No  anxiety or depression.   MEDICATIONS AT HOME:   Prior to Admission medications   Medication Sig Start Date End Date Taking? Authorizing Provider  Albuterol Sulfate (PROAIR RESPICLICK) 035 (90 Base) MCG/ACT AEPB Inhale 1 puff into the lungs every 4 (four) hours as needed (shortness of breath). 05/15/17   Darel Hong, MD  aspirin EC 81 MG tablet Take 81 mg by mouth daily.    [provider]  atorvastatin (LIPITOR) 80 MG tablet Take 80 mg by mouth every evening.     [provider]  BYSTOLIC 5 MG tablet Take 5  mg by mouth daily. 12/19/17   [provider]  clopidogrel (PLAVIX) 75 MG tablet Take 75 mg by mouth daily.    [provider]  cyclobenzaprine (FLEXERIL) 10 MG tablet Take 10 mg by mouth daily as needed for muscle spasms.     [provider]  dorzolamide-timolol (COSOPT) 22.3-6.8 MG/ML ophthalmic solution Place 1 drop into both eyes 2 (two) times daily.    [provider]  guaiFENesin (MUCINEX) 600 MG 12 hr tablet Take 1,200 mg by mouth 2 (two) times daily.     [provider]  hydrochlorothiazide (HYDRODIURIL) 25 MG tablet Take 12.5 mg by mouth every evening.     [provider]  latanoprost (XALATAN) 0.005 % ophthalmic solution Place 1 drop into both eyes at bedtime.    [provider]  levothyroxine (SYNTHROID, LEVOTHROID) 50 MCG tablet Take 50 mcg by mouth daily before breakfast.  01/04/17   [provider]  lisinopril (PRINIVIL,ZESTRIL) 10 MG tablet Take 10 mg by mouth daily. 12/20/17   [provider]  ranolazine (RANEXA) 500 MG 12 hr tablet 500 mg 2 (two) times daily.  12/30/17   [provider]  vitamin B-12 (CYANOCOBALAMIN) 1000 MCG tablet Take 1,000 mcg by mouth daily.    [provider]      VITAL SIGNS:  Blood pressure (!) 162/57, pulse (!) 47, temperature 97.9 F (36.6 C), temperature source Oral, resp. rate 20, height 5\' 7"  (1.702 m), weight 71.7 kg, SpO2 99 %.  PHYSICAL EXAMINATION:  GENERAL:  79 y.o.-year-old patient lying in the bed with no acute distress.  EYES: Pupils equal, round, reactive to light and accommodation. No scleral icterus. Extraocular muscles intact.  HEENT: Head atraumatic, normocephalic. Oropharynx and nasopharynx clear.  NECK:  Supple, no jugular venous distention. No thyroid enlargement, no tenderness.  LUNGS: Normal breath sounds bilaterally, no wheezing, rales,rhonchi or crepitation. No use of accessory muscles of respiration.  CARDIOVASCULAR: S1, S2  normal. No murmurs, rubs, or gallops.  ABDOMEN: Soft, nontender, nondistended. Bowel sounds present. No organomegaly or mass.  EXTREMITIES: No pedal edema, cyanosis, or clubbing.  NEUROLOGIC: Cranial nerves II through XII are intact. Muscle strength 5/5 in all extremities. Sensation intact. Gait not checked.  PSYCHIATRIC: The patient is alert and oriented x 3.  SKIN: No rash, lesion, or ulcer.   LABORATORY PANEL:   CBC Recent Labs  Lab 05/07/19 1057  WBC 11.5*  HGB 11.5*  HCT 33.6*  PLT 225   ------------------------------------------------------------------------------------------------------------------  Chemistries  Recent Labs  Lab 05/07/19 1057  NA 132*  K 4.4  CL 99  CO2 27  GLUCOSE 93  BUN 29*  CREATININE 1.48*  CALCIUM 9.2   ------------------------------------------------------------------------------------------------------------------     EKG:   Preop EKG ordered by me  IMPRESSION AND PLAN:   1.  Gross hematuria.  Case discussed with urology and we will hold the patient's aspirin and Plavix.  We will give continuous bladder irrigation.  We will keep n.p.o. after midnight just in case cystoscopy needed.  Observe overnight.  Gentle IV fluid.  Serial hemoglobin 2.  Essential hypertension blood pressure elevated.  Continue Bystolic, lisinopril and hydrochlorothiazide 3.  Chronic kidney disease stage IIIb.  Will give gentle IV fluids. 4.  History of CAD and peripheral vascular disease.  Hold aspirin and Plavix.  On Bystolic and Lipitor 5.  Hyperlipidemia unspecified on Lipitor 6.  COPD.  Respiratory status stable. 7.  Hypothyroidism unspecified on levothyroxine    All the laboratory data and previous records are reviewed and case discussed with ED provider. Management plans discussed with the patient, family (wife on the phone and they are in agreement. Case discussed with urology.  CODE STATUS: Full Code  TOTAL TIME TAKING CARE OF THIS PATIENT: 50  minutes.    Loletha Grayer M.D on 05/07/2019 at 4:40 PM  Between 7am to 6pm - Pager - [phone]  After 6pm call admission pager [phone]  [facility]  CC: Primary care physician; Idelle Crouch, MD

## 2019-05-07 NOTE — Consult Note (Signed)
Urology Consult  Requesting physician: Lavonia Drafts, MD  Reason for consultation: Gross hematuria  Chief Complaint: Blood in urine  History of Present Illness: Jason House is a 79 y.o. male with no previous urologic history presenting to the ED today with gross hematuria.  -Got up this morning to void and noted urine was darker -Went back to sleep and when he voided after waking states it was "pure blood" -In the ED had urge to void but not able -Foley catheter placed with dark blood and unable to irrigate by ED staff -26 French three-way Foley placed and currently on CBI -No prior history of gross hematuria -On ASA and Plavix -No flank or abdominal pain -Denies nausea, vomiting, fever, chills   Past Medical History:  Diagnosis Date  . Anemia   . COPD (chronic obstructive pulmonary disease) ([place])   . Coronary artery disease   . Glaucoma   . H/O ETOH abuse   . History of shingles   . Hyperlipidemia   . Hypertension   . Hypothyroidism     Past Surgical History:  Procedure Laterality Date  . CHOLECYSTECTOMY N/A 01/18/2019   Procedure: LAPAROSCOPIC CHOLECYSTECTOMY WITH INTRAOPERATIVE CHOLANGIOGRAM;  Surgeon: Olean Ree, MD;  Location: [facility] ORS;  Service: General;  Laterality: N/A;  . COLONOSCOPY WITH PROPOFOL N/A 09/23/2014   Procedure: COLONOSCOPY WITH PROPOFOL;  Surgeon: Josefine Class, MD;  Location: [facility] ENDOSCOPY;  Service: Endoscopy;  Laterality: N/A;  . CORONARY ANGIOPLASTY    . CORONARY ARTERY BYPASS GRAFT    . ENDARTERECTOMY Right 02/12/2018   Procedure: ENDARTERECTOMY CAROTID;  Surgeon: Algernon Huxley, MD;  Location: [facility] ORS;  Service: Vascular;  Laterality: Right;  . ERCP N/A 04/22/2017   Procedure: ENDOSCOPIC RETROGRADE CHOLANGIOPANCREATOGRAPHY (ERCP);  Surgeon: Lucilla Lame, MD;  Location: [facility] ENDOSCOPY;  Service: Endoscopy;  Laterality: N/A;  . EYE SURGERY Bilateral    cataract    Home Medications:  No outpatient medications have been  marked as taking for the 05/07/19 encounter [facility] Encounter).    Allergies: No Known Allergies  Family History  Problem Relation Age of Onset  . Diabetes Mother   . Cancer Sister   . Throat cancer Brother   . Cirrhosis Brother   . COPD Sister   . COPD Sister   . Diabetes Sister     Social History:  reports that he has quit smoking. He has never used smokeless tobacco. He reports that he does not drink alcohol or use drugs.  ROS: A complete review of systems was performed.  All systems are negative except for pertinent findings as noted.  Physical Exam:  Vital signs in last 24 hours: Temp:  [97.9 F (36.6 C)] 97.9 F (36.6 C) (03/05 1009) Pulse Rate:  [47-62] 47 (03/05 1200) Resp:  [20] 20 (03/05 1009) BP: (156-162)/(57-69) 162/57 (03/05 1200) SpO2:  [99 %-100 %] 99 % (03/05 1200) Weight:  [71.7 kg] 71.7 kg (03/05 1000) Constitutional:  Alert and oriented, No acute distress HEENT: [place] AT, moist mucus membranes.  Trachea midline, no masses GI: Abdomen is soft, nontender, nondistended, no abdominal masses GU: Phallus without lesions, testes descended bilateral without masses or tenderness.  Three-way Foley catheter in place with intermittent effluent between pink/rose' on a moderate flow.  The catheter irrigates freely without clots. Skin: No rashes, bruises or suspicious lesions Lymph: No cervical or inguinal adenopathy Neurologic: Grossly intact, no focal deficits, moving all 4 extremities Psychiatric: Normal mood and affect   Laboratory Data:  Recent  Labs    05/07/19 1057  WBC 11.5*  HGB 11.5*  HCT 33.6*   Recent Labs    05/07/19 1057  NA 132*  K 4.4  CL 99  CO2 27  GLUCOSE 93  BUN 29*  CREATININE 1.48*  CALCIUM 9.2   No results for input(s): LABPT, INR in the last 72 hours. No results for input(s): LABURIN in the last 72 hours. Results for orders placed or performed during the hospital encounter of 01/14/19  SARS CORONAVIRUS 2 (TAT 6-24 HRS)  Nasopharyngeal Nasopharyngeal Swab     Status: None   Collection Time: 01/14/19  7:44 AM   Specimen: Nasopharyngeal Swab  Result Value Ref Range Status   SARS Coronavirus 2 NEGATIVE NEGATIVE Final    Comment: (NOTE) SARS-CoV-2 target nucleic acids are NOT DETECTED. The SARS-CoV-2 RNA is generally detectable in upper and lower respiratory specimens during the acute phase of infection. Negative results do not preclude SARS-CoV-2 infection, do not rule out co-infections with other pathogens, and should not be used as the sole basis for treatment or other patient management decisions. Negative results must be combined with clinical observations, patient history, and epidemiological information. The expected result is Negative. Fact Sheet for Patients: SugarRoll.be Fact Sheet for Healthcare Providers: https://www.woods-mathews.com/ This test is not yet approved or cleared by the [place] FDA and  has been authorized for detection and/or diagnosis of SARS-CoV-2 by FDA under an Emergency Use Authorization (EUA). This EUA will remain  in effect (meaning this test can be used) for the duration of the COVID-19 declaration under Section 56 4(b)(1) of the Act, 21 U.S.C. section 360bbb-3(b)(1), unless the authorization is terminated or revoked sooner. Performed at [facility], [place] [address] [place], [place] [address]      Impression:  79 y.o. male on ASA/Plavix with gross hematuria.  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  Recommendation:  -If CBI effluent remains pink-rose' on moderate rate would keep on CBI overnight  -Hold ASA/Plavix  -If hematuria worsens or does not improve may need cystoscopy under anesthesia with clot evacuation/fulguration   05/07/2019, 4:03 PM  John Giovanni, MD

## 2019-05-07 NOTE — Plan of Care (Signed)

## 2019-05-07 NOTE — ED Triage Notes (Signed)
Pt started with hematuria this morning. Pt reports "pure blood" with urination.  Ambulatory.  No pain. No fevers.

## 2019-05-07 NOTE — ED Notes (Signed)
Pt resting in bed, with NAD noted at this time. CBI continues to flow at this time. Pt resting in bed watching TV. Denies any needs.

## 2019-05-07 NOTE — ED Notes (Signed)
EDP made aware of bladder scan. This RN discussed plan with EDP, per EDP insert foley and irrigate PRN for blood clots, this RN irrigated approx 150cc's with 150cc's returned, urine noted to go from dark red to a lighter shade of red. Pt tolerated well.

## 2019-05-07 NOTE — ED Notes (Signed)
This RN to bedside due to patient calling out, pt states "I feel like I'm about to bust", output noted to be incongruent with input from CBI, clot noted in foley at this time, foley flushed by this RN. Several clots noted to be flushed out of foley at this time, drainage noted to be becoming clearer than previously noted. 3 way foley noted to be patent and draining at this time, CBI re-initiated.

## 2019-05-07 NOTE — ED Provider Notes (Addendum)
[facility] Emergency Department Provider Note   ____________________________________________    I have reviewed the triage vital signs and the nursing notes.   HISTORY  Chief Complaint Hematuria     HPI Jason House is a 79 y.o. male with history as noted below who presents with complaints of hematuria this morning.  Patient reports he got up to urinate this morning and noticed that his urine became darker as he was urinating.  He went back to sleep.  The second time he tried to urinate it was almost "all blood ".  Here in the emergency department he feels that he is not able to urinate, he feels like he needs to but he is not able to.  He denies abdominal pain.  Is on aspirin and Plavix reportedly.  Has never had this issue before.  Denies a history of prostate problems.  Past Medical History:  Diagnosis Date  . Anemia   . COPD (chronic obstructive pulmonary disease) ([place])   . Coronary artery disease   . Glaucoma   . H/O ETOH abuse   . History of shingles   . Hyperlipidemia   . Hypertension   . Hypothyroidism     Patient Active Problem List   Diagnosis Date Noted  . Gallstone pancreatitis   . Carotid stenosis, asymptomatic, right 02/12/2018  . Acquired hypothyroidism 07/22/2017  . Calculus of bile duct without cholecystitis and without obstruction   . Encounter for fitting and adjustment of other gastrointestinal appliance and device   . Gram-negative bacteremia 04/18/2017  . Atherosclerotic heart disease of native coronary artery without angina pectoris 04/08/2017  . Chronic kidney disease 04/08/2017  . Glaucoma 04/08/2017  . Choledocholithiasis 03/27/2017  . Jaundice   . Acute pancreatitis   . Bilateral carotid artery stenosis 01/28/2017  . Hyperlipidemia 01/28/2017  . Essential hypertension 01/28/2017  . Anemia 04/22/2012    Past Surgical History:  Procedure Laterality Date  . CHOLECYSTECTOMY N/A 01/18/2019   Procedure:  LAPAROSCOPIC CHOLECYSTECTOMY WITH INTRAOPERATIVE CHOLANGIOGRAM;  Surgeon: Olean Ree, MD;  Location: [facility] ORS;  Service: General;  Laterality: N/A;  . COLONOSCOPY WITH PROPOFOL N/A 09/23/2014   Procedure: COLONOSCOPY WITH PROPOFOL;  Surgeon: Josefine Class, MD;  Location: [facility] ENDOSCOPY;  Service: Endoscopy;  Laterality: N/A;  . CORONARY ANGIOPLASTY    . CORONARY ARTERY BYPASS GRAFT    . ENDARTERECTOMY Right 02/12/2018   Procedure: ENDARTERECTOMY CAROTID;  Surgeon: Algernon Huxley, MD;  Location: [facility] ORS;  Service: Vascular;  Laterality: Right;  . ERCP N/A 04/22/2017   Procedure: ENDOSCOPIC RETROGRADE CHOLANGIOPANCREATOGRAPHY (ERCP);  Surgeon: Lucilla Lame, MD;  Location: [facility] ENDOSCOPY;  Service: Endoscopy;  Laterality: N/A;  . EYE SURGERY Bilateral    cataract    Prior to Admission medications   Medication Sig Start Date End Date Taking? Authorizing Provider  Albuterol Sulfate (PROAIR RESPICLICK) 937 (90 Base) MCG/ACT AEPB Inhale 1 puff into the lungs every 4 (four) hours as needed (shortness of breath). 05/15/17   Darel Hong, MD  aspirin EC 81 MG tablet Take 81 mg by mouth daily.    [provider]  atorvastatin (LIPITOR) 80 MG tablet Take 80 mg by mouth every evening.     [provider]  BYSTOLIC 5 MG tablet Take 5 mg by mouth daily. 12/19/17   [provider]  clopidogrel (PLAVIX) 75 MG tablet Take 75 mg by mouth daily.    [provider]  cyclobenzaprine (FLEXERIL) 10 MG tablet Take 10 mg by  mouth daily as needed for muscle spasms.     [provider]  dorzolamide-timolol (COSOPT) 22.3-6.8 MG/ML ophthalmic solution Place 1 drop into both eyes 2 (two) times daily.    [provider]  guaiFENesin (MUCINEX) 600 MG 12 hr tablet Take 1,200 mg by mouth 2 (two) times daily.     [provider]  hydrochlorothiazide (HYDRODIURIL) 25 MG tablet Take 12.5 mg by mouth every evening.     [provider]  latanoprost  (XALATAN) 0.005 % ophthalmic solution Place 1 drop into both eyes at bedtime.    [provider]  levothyroxine (SYNTHROID, LEVOTHROID) 50 MCG tablet Take 50 mcg by mouth daily before breakfast.  01/04/17   [provider]  lisinopril (PRINIVIL,ZESTRIL) 10 MG tablet Take 10 mg by mouth daily. 12/20/17   [provider]  ranolazine (RANEXA) 500 MG 12 hr tablet 500 mg 2 (two) times daily.  12/30/17   [provider]  vitamin B-12 (CYANOCOBALAMIN) 1000 MCG tablet Take 1,000 mcg by mouth daily.    [provider]     Allergies Patient has no known allergies.  Family History  Problem Relation Age of Onset  . Diabetes Mother   . Cancer Sister   . Throat cancer Brother   . Cirrhosis Brother   . COPD Sister   . COPD Sister   . Diabetes Sister     Social History Social History   Tobacco Use  . Smoking status: Former Research scientist (life sciences)  . Smokeless tobacco: Never Used  . Tobacco comment: 30 years ago  Substance Use Topics  . Alcohol use: No    Comment: stopped 2010  . Drug use: No    Review of Systems  Constitutional: No fever/chills Eyes: No visual changes.  ENT: No sore throat.  Has had occasional nosebleeds which he attributes to dry air Cardiovascular: Denies chest pain. Respiratory: Denies shortness of breath. Gastrointestinal: As above Genitourinary: As above Musculoskeletal: Negative for back pain. Skin: Negative for rash. Neurological: Negative for headaches   ____________________________________________   PHYSICAL EXAM:  VITAL SIGNS: ED Triage Vitals  Enc Vitals Group     BP 05/07/19 1009 (!) 156/69     Pulse Rate 05/07/19 1009 62     Resp 05/07/19 1009 20     Temp 05/07/19 1009 97.9 F (36.6 C)     Temp Source 05/07/19 1009 Oral     SpO2 05/07/19 1009 100 %     Weight 05/07/19 1000 71.7 kg (158 lb)     Height 05/07/19 1000 1.702 m (5\' 7" )     Head Circumference --      Peak Flow --      Pain Score 05/07/19 1000 0      Pain Loc --      Pain Edu? --      Excl. in [place]? --     Constitutional: Alert and oriented  Nose: No congestion/rhinnorhea. Mouth/Throat: Mucous membranes are moist.   Neck:  Painless ROM Cardiovascular: Normal rate, regular rhythm. Good peripheral circulation. Respiratory: Normal respiratory effort.  No retractions. Lungs CTAB. Gastrointestinal: Soft and nontender. No distention.   Genitourinary: Normal exam Musculoskeletal:  Warm and well perfused Neurologic:  Normal speech and language. No gross focal neurologic deficits are appreciated.  Skin:  Skin is warm, dry and intact. No rash noted. Psychiatric: Mood and affect are normal. Speech and behavior are normal.  ____________________________________________   LABS (all labs ordered are listed, but only abnormal results are displayed)  Labs Reviewed  URINALYSIS, COMPLETE (UACMP) WITH MICROSCOPIC - Abnormal; Notable for the following components:      Result Value   Color, Urine RED (*)    APPearance CLOUDY (*)    Glucose, UA   (*)    Value: TEST NOT REPORTED DUE TO COLOR INTERFERENCE OF URINE PIGMENT   Hgb urine dipstick   (*)    Value: TEST NOT REPORTED DUE TO COLOR INTERFERENCE OF URINE PIGMENT   Bilirubin Urine   (*)    Value: TEST NOT REPORTED DUE TO COLOR INTERFERENCE OF URINE PIGMENT   Ketones, ur   (*)    Value: TEST NOT REPORTED DUE TO COLOR INTERFERENCE OF URINE PIGMENT   Protein, ur   (*)    Value: TEST NOT REPORTED DUE TO COLOR INTERFERENCE OF URINE PIGMENT   Nitrite   (*)    Value: TEST NOT REPORTED DUE TO COLOR INTERFERENCE OF URINE PIGMENT   Leukocytes,Ua   (*)    Value: TEST NOT REPORTED DUE TO COLOR INTERFERENCE OF URINE PIGMENT   All other components within normal limits  CBC - Abnormal; Notable for the following components:   WBC 11.5 (*)    RBC 3.50 (*)    Hemoglobin 11.5 (*)    HCT 33.6 (*)    All other components within normal limits  BASIC METABOLIC PANEL - Abnormal; Notable for the following  components:   Sodium 132 (*)    BUN 29 (*)    Creatinine, Ser 1.48 (*)    GFR calc non Af Amer 44 (*)    GFR calc Af Amer 51 (*)    All other components within normal limits   ____________________________________________  EKG  ED ECG REPORT I, Lavonia Drafts, the attending physician, personally viewed and interpreted this ECG.  Date: 06/02/2019  Rhythm: normal sinus rhythm QRS Axis: normal Intervals: normal ST/T Wave abnormalities: normal Narrative Interpretation: no evidence of acute ischemia  ____________________________________________  RADIOLOGY  None ____________________________________________   PROCEDURES  Procedure(s) performed: No  Procedures   Critical Care performed: No ____________________________________________   INITIAL IMPRESSION / ASSESSMENT AND PLAN / ED COURSE  Pertinent labs & imaging results that were available during my care of the patient were reviewed by me and considered in my medical decision making (see chart for details).  Patient with hematuria as noted below, no significant pain.  I suspect the patient is starting to develop urinary retention related to a blood clot in the bladder.  We will give IV fluids to see if he is able to urinate.  If not may require Foley catheter.  Urinalysis limited due to color no bacteria seen.  20-gauge catheter placed with dark red urine/blood draining.  Will switch to CBI after discussion with Dr. Bernardo Heater  Plan is for CBI, if no clearing will require admission to medicine with urology consultation   ----------------------------------------- 3:00 PM on 05/07/2019 -----------------------------------------   Seen by Dr. Bernardo Heater of urology who recommends additional hours of CBI, if no clearing will need admission to medicine for CBI overnight    ____________________________________________   FINAL CLINICAL IMPRESSION(S) / ED DIAGNOSES  Final diagnoses:  Gross hematuria        Note:   This document was prepared using Dragon voice recognition software and may include unintentional dictation errors.   Lavonia Drafts, MD 05/07/19 1428    Lavonia Drafts, MD 05/07/19 1500    Lavonia Drafts, MD 06/02/19 [phone]

## 2019-05-07 NOTE — ED Provider Notes (Signed)
Procedures     ----------------------------------------- 4:20 PM on 05/07/2019 -----------------------------------------   Patient has persistent hematuria on CBI.  Per Dr. Dene Gentry plan, will hospitalize overnight for continued CBI and reassessment tomorrow. Case d/w hospitalist Dr. Lillia Dallas, MD 05/07/19 [id number]

## 2019-05-07 NOTE — ED Notes (Signed)
Admitting MD at bedside at this time. CBI re-initated at this time by this RN. Covid swab collected by Carron Brazen, RN. Pt tolerated well.

## 2019-05-08 DIAGNOSIS — Z7902 Long term (current) use of antithrombotics/antiplatelets: Secondary | ICD-10-CM | POA: Diagnosis not present

## 2019-05-08 DIAGNOSIS — I739 Peripheral vascular disease, unspecified: Secondary | ICD-10-CM | POA: Diagnosis present

## 2019-05-08 DIAGNOSIS — N1831 Chronic kidney disease, stage 3a: Secondary | ICD-10-CM

## 2019-05-08 DIAGNOSIS — Z79890 Hormone replacement therapy (postmenopausal): Secondary | ICD-10-CM | POA: Diagnosis not present

## 2019-05-08 DIAGNOSIS — Z79899 Other long term (current) drug therapy: Secondary | ICD-10-CM | POA: Diagnosis not present

## 2019-05-08 DIAGNOSIS — J449 Chronic obstructive pulmonary disease, unspecified: Secondary | ICD-10-CM | POA: Diagnosis present

## 2019-05-08 DIAGNOSIS — Z951 Presence of aortocoronary bypass graft: Secondary | ICD-10-CM | POA: Diagnosis not present

## 2019-05-08 DIAGNOSIS — Z87891 Personal history of nicotine dependence: Secondary | ICD-10-CM | POA: Diagnosis not present

## 2019-05-08 DIAGNOSIS — I2510 Atherosclerotic heart disease of native coronary artery without angina pectoris: Secondary | ICD-10-CM | POA: Diagnosis present

## 2019-05-08 DIAGNOSIS — Z20822 Contact with and (suspected) exposure to covid-19: Secondary | ICD-10-CM | POA: Diagnosis present

## 2019-05-08 DIAGNOSIS — I129 Hypertensive chronic kidney disease with stage 1 through stage 4 chronic kidney disease, or unspecified chronic kidney disease: Secondary | ICD-10-CM | POA: Diagnosis present

## 2019-05-08 DIAGNOSIS — R310 Gross hematuria: Secondary | ICD-10-CM | POA: Diagnosis present

## 2019-05-08 DIAGNOSIS — Z825 Family history of asthma and other chronic lower respiratory diseases: Secondary | ICD-10-CM | POA: Diagnosis not present

## 2019-05-08 DIAGNOSIS — E039 Hypothyroidism, unspecified: Secondary | ICD-10-CM | POA: Diagnosis present

## 2019-05-08 DIAGNOSIS — I10 Essential (primary) hypertension: Secondary | ICD-10-CM | POA: Diagnosis not present

## 2019-05-08 DIAGNOSIS — N1832 Chronic kidney disease, stage 3b: Secondary | ICD-10-CM | POA: Diagnosis present

## 2019-05-08 DIAGNOSIS — D494 Neoplasm of unspecified behavior of bladder: Secondary | ICD-10-CM | POA: Diagnosis present

## 2019-05-08 DIAGNOSIS — D62 Acute posthemorrhagic anemia: Secondary | ICD-10-CM | POA: Diagnosis present

## 2019-05-08 DIAGNOSIS — N3289 Other specified disorders of bladder: Secondary | ICD-10-CM | POA: Diagnosis not present

## 2019-05-08 DIAGNOSIS — Z8249 Family history of ischemic heart disease and other diseases of the circulatory system: Secondary | ICD-10-CM | POA: Diagnosis not present

## 2019-05-08 DIAGNOSIS — E785 Hyperlipidemia, unspecified: Secondary | ICD-10-CM | POA: Diagnosis present

## 2019-05-08 DIAGNOSIS — H409 Unspecified glaucoma: Secondary | ICD-10-CM | POA: Diagnosis present

## 2019-05-08 DIAGNOSIS — Z7982 Long term (current) use of aspirin: Secondary | ICD-10-CM | POA: Diagnosis not present

## 2019-05-08 LAB — CBC
HCT: 31.2 % — ABNORMAL LOW (ref 39.0–52.0)
Hemoglobin: 10.4 g/dL — ABNORMAL LOW (ref 13.0–17.0)
MCH: 32 pg (ref 26.0–34.0)
MCHC: 33.3 g/dL (ref 30.0–36.0)
MCV: 96 fL (ref 80.0–100.0)
Platelets: 242 10*3/uL (ref 150–400)
RBC: 3.25 MIL/uL — ABNORMAL LOW (ref 4.22–5.81)
RDW: 12.7 % (ref 11.5–15.5)
WBC: 13.2 10*3/uL — ABNORMAL HIGH (ref 4.0–10.5)
nRBC: 0 % (ref 0.0–0.2)

## 2019-05-08 LAB — BASIC METABOLIC PANEL
Anion gap: 9 (ref 5–15)
BUN: 28 mg/dL — ABNORMAL HIGH (ref 8–23)
CO2: 24 mmol/L (ref 22–32)
Calcium: 9 mg/dL (ref 8.9–10.3)
Chloride: 103 mmol/L (ref 98–111)
Creatinine, Ser: 1.34 mg/dL — ABNORMAL HIGH (ref 0.61–1.24)
GFR calc Af Amer: 58 mL/min — ABNORMAL LOW (ref >60)
GFR calc non Af Amer: 50 mL/min — ABNORMAL LOW (ref >60)
Glucose, Bld: 92 mg/dL (ref 70–99)
Potassium: 4.5 mmol/L (ref 3.5–5.1)
Sodium: 136 mmol/L (ref 135–145)

## 2019-05-08 MED ORDER — CHLORHEXIDINE GLUCONATE CLOTH 2 % EX PADS: 6.0000 | MEDICATED_PAD | Freq: Every day | CUTANEOUS | Status: DC

## 2019-05-08 MED ORDER — SODIUM CHLORIDE 0.9 % IV SOLN
1.0000 g | Freq: Once | INTRAVENOUS | Status: AC
  Administered 2019-05-09: 1 g via INTRAVENOUS
  Filled 2019-05-08: qty 1

## 2019-05-08 NOTE — Progress Notes (Signed)
Urology Consult Follow Up  Subjective: No complaints this morning.  Denies flank or abdominal pain.  No manual irrigation required overnight.   Current Facility-Administered Medications  Medication Dose Route Frequency Provider Last Rate Last Admin  . 0.9 %  sodium chloride infusion   Intravenous Continuous Loletha Grayer, MD 50 mL/hr at 05/07/19 1854 New Bag at 05/07/19 1854  . acetaminophen (TYLENOL) tablet 650 mg  650 mg Oral Q6H PRN Loletha Grayer, MD       Or  . acetaminophen (TYLENOL) suppository 650 mg  650 mg Rectal Q6H PRN Wieting, Richard, MD      . albuterol (PROVENTIL) (2.5 MG/3ML) 0.083% nebulizer solution 2.5 mg  2.5 mg Inhalation Q4H PRN Wieting, Richard, MD      . atorvastatin (LIPITOR) tablet 80 mg  80 mg Oral QPM Loletha Grayer, MD   80 mg at 05/07/19 1849  . Chlorhexidine Gluconate Cloth 2 % PADS 6 each  6 each Topical Daily Wieting, Richard, MD      . cyclobenzaprine (FLEXERIL) tablet 10 mg  10 mg Oral Daily PRN Wieting, Richard, MD      . dorzolamide-timolol (COSOPT) 22.3-6.8 MG/ML ophthalmic solution 1 drop  1 drop Both Eyes BID Loletha Grayer, MD   1 drop at 05/08/19 0925  . hydrochlorothiazide (HYDRODIURIL) tablet 12.5 mg  12.5 mg Oral QPM Loletha Grayer, MD   12.5 mg at 05/07/19 1849  . latanoprost (XALATAN) 0.005 % ophthalmic solution 1 drop  1 drop Both Eyes QHS Loletha Grayer, MD   1 drop at 05/07/19 2105  . levothyroxine (SYNTHROID) tablet 50 mcg  50 mcg Oral QAC breakfast Loletha Grayer, MD   50 mcg at 05/08/19 0522  . lisinopril (ZESTRIL) tablet 10 mg  10 mg Oral Daily Loletha Grayer, MD   10 mg at 05/08/19 1053  . nebivolol (BYSTOLIC) tablet 5 mg  5 mg Oral Daily Loletha Grayer, MD   5 mg at 05/08/19 1057  . ondansetron (ZOFRAN) tablet 4 mg  4 mg Oral Q6H PRN Loletha Grayer, MD       Or  . ondansetron [facility]) injection 4 mg  4 mg Intravenous Q6H PRN Wieting, Richard, MD      . ranolazine (RANEXA) 12 hr tablet 1,000 mg  1,000 mg Oral BID  Loletha Grayer, MD   1,000 mg at 05/08/19 1055  . sodium chloride irrigation 0.9 % 3,000 mL  3,000 mL Irrigation Continuous Loletha Grayer, MD   3,000 mL at 05/07/19 2040  . vitamin B-12 (CYANOCOBALAMIN) tablet 1,000 mcg  1,000 mcg Oral Daily Loletha Grayer, MD   1,000 mcg at 05/08/19 1054     Objective: Vital signs in last 24 hours: Temp:  [97.5 F (36.4 C)-98.6 F (37 C)] 98.3 F (36.8 C) (03/06 0852) Pulse Rate:  [47-70] 70 (03/06 0852) Resp:  [16-21] 21 (03/06 0852) BP: (132-187)/(57-79) 132/60 (03/06 0852) SpO2:  [98 %-100 %] 98 % (03/06 0852)  Intake/Output from previous day: 03/05 0701 - 03/06 0700 In: [place] [IV Piggyback:500] Out: [id number] [XLKGM:01027; Stool:1] Intake/Output this shift: Total I/O In: 3000 [Other:3000] Out: 3500 [Urine:3500]   Physical Exam: Abdomen soft, nontender CBI effluent clear-pink on moderate flow  Lab Results:  Recent Labs    05/07/19 1057 05/08/19 0612  WBC 11.5* 13.2*  HGB 11.5* 10.4*  HCT 33.6* 31.2*  PLT 225 242   BMET Recent Labs    05/07/19 1057 05/08/19 0612  NA 132* 136  K 4.4 4.5  CL 99 103  CO2 27  24  GLUCOSE 93 92  BUN 29* 28*  CREATININE 1.48* 1.34*  CALCIUM 9.2 9.0     Assessment: Gross hematuria most likely from a lower tract source.  CBI effluent looks good on moderate flow  Recommendation: -No urgent indication for cystoscopy/fulguration -Continue CBI and titrate drip -If still requiring moderate flow CBI tomorrow we will proceed with cystoscopy/fulguration 3/7 -GFR improving, CT urogram if it continues to improve   Ronda Fairly [facility] 05/08/2019

## 2019-05-08 NOTE — Progress Notes (Signed)
Bedside shift report received from [place], [place]. Morning assessment completed. CBI in place with blood tinged urine. Pt. Has been NPO since midnight. Denies any pain or needs at this time. Call light is within reach, will continue to monitor.

## 2019-05-08 NOTE — Progress Notes (Signed)
Urology Consult  No complaints.  Exam: CBI effluent darker red on very slow drip.  Cleared fairly rapidly with increasing to a moderate flow.  Plan: We will tentatively schedule in a.m. cystoscopy with clot evacuation; bilateral retrograde pyelograms; possible TURBT/bladder biopsy  The procedure was discussed in detail occluding potential risk of bleeding, infection as well as anesthetic risks.  If no significant bleeding in the morning will cancel the procedure.

## 2019-05-08 NOTE — Progress Notes (Signed)
Pt. Remains NPO, morning medications held per NPO order, will administer after procedure or NPO order is lifted. Pt. Denies any additional needs at this time. Call light is within reach, will continue to monitor.

## 2019-05-08 NOTE — Progress Notes (Signed)
Patient ID: Jason House, male   DOB: 1940/09/10, 79 y.o.   MRN: [id number] [facility] PROGRESS NOTE  MUSTAF ANTONACCI RSW:[id number] DOB: [birth year] DOA: 05/07/2019 PCP: Idelle Crouch, MD  HPI/Subjective: The patient still having hematuria with a continuous bladder irrigation.  No abdominal pain.  No chest pain or shortness of breath.  Objective: Vitals:   05/08/19 0421 05/08/19 0852  BP: (!) 153/58 132/60  Pulse: 67 70  Resp: 16 (!) 21  Temp: 98.6 F (37 C) 98.3 F (36.8 C)  SpO2: 99% 98%    Intake/Output Summary (Last 24 hours) at 05/08/2019 1220 Last data filed at 05/08/2019 1100 Gross per 24 hour  Intake 55900 ml  Output 62476 ml  Net -6576 ml   Filed Weights   05/07/19 1000  Weight: 71.7 kg    ROS: Review of Systems  Constitutional: Negative for fever.  Eyes: Negative for blurred vision.  Respiratory: Negative for shortness of breath.   Cardiovascular: Negative for chest pain.  Gastrointestinal: Negative for abdominal pain, nausea and vomiting.  Genitourinary: Negative for dysuria.  Musculoskeletal: Negative for joint pain.  Neurological: Negative for headaches.   Exam: Physical Exam  Constitutional: He is oriented to person, place, and time.  HENT:  Nose: No mucosal edema.  Mouth/Throat: No oropharyngeal exudate or posterior oropharyngeal edema.  Eyes: Conjunctivae and lids are normal.  Neck: Carotid bruit is not present.  Cardiovascular: S1 normal and S2 normal. Exam reveals no gallop.  No murmur heard. Respiratory: No respiratory distress. He has no wheezes. He has no rhonchi. He has no rales.  GI: Soft. Bowel sounds are normal. There is no abdominal tenderness.  Musculoskeletal:     Right ankle: No swelling.     Left ankle: No swelling.  Lymphadenopathy:    He has no cervical adenopathy.  Neurological: He is alert and oriented to person, place, and time.  Skin: Skin is warm. No rash noted. Nails show no clubbing.  Psychiatric: He has a  normal mood and affect.      Data Reviewed: Basic Metabolic Panel: Recent Labs  Lab 05/07/19 1057 05/08/19 0612  NA 132* 136  K 4.4 4.5  CL 99 103  CO2 27 24  GLUCOSE 93 92  BUN 29* 28*  CREATININE 1.48* 1.34*  CALCIUM 9.2 9.0   CBC: Recent Labs  Lab 05/07/19 1057 05/08/19 0612  WBC 11.5* 13.2*  HGB 11.5* 10.4*  HCT 33.6* 31.2*  MCV 96.0 96.0  PLT 225 242     Recent Results (from the past 240 hour(s))  Respiratory Panel by RT PCR (Flu A&B, Covid) - Nasopharyngeal Swab     Status: None   Collection Time: 05/07/19  4:12 PM   Specimen: Nasopharyngeal Swab  Result Value Ref Range Status   SARS Coronavirus 2 by RT PCR NEGATIVE NEGATIVE Final    Comment: (NOTE) SARS-CoV-2 target nucleic acids are NOT DETECTED. The SARS-CoV-2 RNA is generally detectable in upper respiratoy specimens during the acute phase of infection. The lowest concentration of SARS-CoV-2 viral copies this assay can detect is 131 copies/mL. A negative result does not preclude SARS-Cov-2 infection and should not be used as the sole basis for treatment or other patient management decisions. A negative result may occur with  improper specimen collection/handling, submission of specimen other than nasopharyngeal swab, presence of viral mutation(s) within the areas targeted by this assay, and inadequate number of viral copies (<131 copies/mL). A negative result must be combined with clinical observations, patient history,  and epidemiological information. The expected result is Negative. Fact Sheet for Patients:  PinkCheek.be Fact Sheet for Healthcare Providers:  GravelBags.it This test is not yet ap proved or cleared by the [place] FDA and  has been authorized for detection and/or diagnosis of SARS-CoV-2 by FDA under an Emergency Use Authorization (EUA). This EUA will remain  in effect (meaning this test can be used) for the duration of  the COVID-19 declaration under Section 564(b)(1) of the Act, 21 U.S.C. section 360bbb-3(b)(1), unless the authorization is terminated or revoked sooner.    Influenza A by PCR NEGATIVE NEGATIVE Final   Influenza B by PCR NEGATIVE NEGATIVE Final    Comment: (NOTE) The Xpert Xpress SARS-CoV-2/FLU/RSV assay is intended as an aid in  the diagnosis of influenza from Nasopharyngeal swab specimens and  should not be used as a sole basis for treatment. Nasal washings and  aspirates are unacceptable for Xpert Xpress SARS-CoV-2/FLU/RSV  testing. Fact Sheet for Patients: PinkCheek.be Fact Sheet for Healthcare Providers: GravelBags.it This test is not yet approved or cleared by the [place] FDA and  has been authorized for detection and/or diagnosis of SARS-CoV-2 by  FDA under an Emergency Use Authorization (EUA). This EUA will remain  in effect (meaning this test can be used) for the duration of the  Covid-19 declaration under Section 564(b)(1) of the Act, 21  U.S.C. section 360bbb-3(b)(1), unless the authorization is  terminated or revoked. Performed at [facility] [address] [place], [place] [address]       Scheduled Meds: . atorvastatin  80 mg Oral QPM  . Chlorhexidine Gluconate Cloth  6 each Topical Daily  . dorzolamide-timolol  1 drop Both Eyes BID  . hydrochlorothiazide  12.5 mg Oral QPM  . latanoprost  1 drop Both Eyes QHS  . levothyroxine  50 mcg Oral QAC breakfast  . lisinopril  10 mg Oral Daily  . nebivolol  5 mg Oral Daily  . ranolazine  1,000 mg Oral BID  . vitamin B-12  1,000 mcg Oral Daily   Continuous Infusions: . sodium chloride 50 mL/hr at 05/07/19 1854  . sodium chloride irrigation      Assessment/Plan:  1. Persistent hematuria despite continuous bladder irrigation.  Case discussed with urology and they recommended continuous irrigation today into tomorrow. If hematuria still present  tomorrow may end up needing a cystoscopy.  We will keep n.p.o. after midnight.  Hemoglobin did come down a little bit to 10.4 but I have been giving him IV fluids also 2. Essential hypertension.  On Bystolic, lisinopril and hydrochlorothiazide 3. CKD stage 3a 4. Hyperlipidemia unspecified on lipitor 5. COPD.  Respiratory status stable 6. Hypothyroidism unspecified on levothyroxine 7. Hx PVD and CAD- holding asa and plavix.  On bystolic and lipitor.  Code Status:     Code Status Orders  (From admission, onward)         Start     Ordered   05/07/19 1634  Full code  Continuous     05/07/19 1633        Code Status History    Date Active Date Inactive Code Status Order ID Comments User Context   02/12/2018 1650 02/13/2018 1601 Full Code [id number]  Algernon Huxley, MD Inpatient   03/27/2017 2018 04/01/2017 2006 Full Code [id number]  Bettey Costa, MD Inpatient   Advance Care Planning Activity     Family Communication: spoke with wife on the phone Disposition Plan: Hematuria needs to clear prior to disposition.  Continue CBI and urology follow up in the morning to decide on cystoscopy or not.  Consultants:  Urology  Time spent: 28 minutes  [place]

## 2019-05-09 ENCOUNTER — Encounter
Admission: EM | Disposition: A | Discharge status: Home / Self Care | Payer: Self-pay | Source: Home / Self Care | Attending: Internal Medicine

## 2019-05-09 ENCOUNTER — Inpatient Hospital Stay: Payer: PPO

## 2019-05-09 ENCOUNTER — Inpatient Hospital Stay: Payer: PPO | Admitting: Anesthesiology

## 2019-05-09 DIAGNOSIS — D62 Acute posthemorrhagic anemia: Secondary | ICD-10-CM

## 2019-05-09 DIAGNOSIS — R310 Gross hematuria: Secondary | ICD-10-CM | POA: Diagnosis not present

## 2019-05-09 DIAGNOSIS — N3289 Other specified disorders of bladder: Secondary | ICD-10-CM | POA: Diagnosis not present

## 2019-05-09 DIAGNOSIS — D494 Neoplasm of unspecified behavior of bladder: Secondary | ICD-10-CM | POA: Diagnosis not present

## 2019-05-09 HISTORY — PX: CYSTOSCOPY W/ RETROGRADES: SHX1426

## 2019-05-09 HISTORY — PX: HEMATOMA EVACUATION: SHX5118

## 2019-05-09 HISTORY — PX: TRANSURETHRAL RESECTION OF BLADDER TUMOR: SHX2575

## 2019-05-09 HISTORY — PX: CYSTOSCOPY WITH BIOPSY: SHX5122

## 2019-05-09 LAB — CBC
HCT: 28.3 % — ABNORMAL LOW (ref 39.0–52.0)
Hemoglobin: 9.6 g/dL — ABNORMAL LOW (ref 13.0–17.0)
MCH: 32.7 pg (ref 26.0–34.0)
MCHC: 33.9 g/dL (ref 30.0–36.0)
MCV: 96.3 fL (ref 80.0–100.0)
Platelets: 224 10*3/uL (ref 150–400)
RBC: 2.94 MIL/uL — ABNORMAL LOW (ref 4.22–5.81)
RDW: 12.7 % (ref 11.5–15.5)
WBC: 12.4 10*3/uL — ABNORMAL HIGH (ref 4.0–10.5)
nRBC: 0 % (ref 0.0–0.2)

## 2019-05-09 LAB — BASIC METABOLIC PANEL
Anion gap: 7 (ref 5–15)
BUN: 33 mg/dL — ABNORMAL HIGH (ref 8–23)
CO2: 23 mmol/L (ref 22–32)
Calcium: 8.8 mg/dL — ABNORMAL LOW (ref 8.9–10.3)
Chloride: 105 mmol/L (ref 98–111)
Creatinine, Ser: 1.44 mg/dL — ABNORMAL HIGH (ref 0.61–1.24)
GFR calc Af Amer: 53 mL/min — ABNORMAL LOW (ref >60)
GFR calc non Af Amer: 46 mL/min — ABNORMAL LOW (ref >60)
Glucose, Bld: 97 mg/dL (ref 70–99)
Potassium: 4.7 mmol/L (ref 3.5–5.1)
Sodium: 135 mmol/L (ref 135–145)

## 2019-05-09 LAB — SURGICAL PCR SCREEN
MRSA, PCR: POSITIVE — AB
Staphylococcus aureus: POSITIVE — AB

## 2019-05-09 SURGERY — CYSTOSCOPY, WITH RETROGRADE PYELOGRAM
Anesthesia: General

## 2019-05-09 MED ORDER — DEXAMETHASONE SODIUM PHOSPHATE 10 MG/ML IJ SOLN
INTRAMUSCULAR | Status: DC | PRN
  Administered 2019-05-09: 5 mg via INTRAVENOUS

## 2019-05-09 MED ORDER — DEXAMETHASONE SODIUM PHOSPHATE 10 MG/ML IJ SOLN
INTRAMUSCULAR | Status: AC
  Filled 2019-05-09: qty 1

## 2019-05-09 MED ORDER — FENTANYL CITRATE (PF) 100 MCG/2ML IJ SOLN
INTRAMUSCULAR | Status: DC | PRN
  Administered 2019-05-09: 50 ug via INTRAVENOUS
  Administered 2019-05-09 (×2): 25 ug via INTRAVENOUS

## 2019-05-09 MED ORDER — PROPOFOL 10 MG/ML IV BOLUS
INTRAVENOUS | Status: DC | PRN
  Administered 2019-05-09: 100 mg via INTRAVENOUS

## 2019-05-09 MED ORDER — EPHEDRINE SULFATE 50 MG/ML IJ SOLN
INTRAMUSCULAR | Status: DC | PRN
  Administered 2019-05-09 (×2): 7.5 mg via INTRAVENOUS

## 2019-05-09 MED ORDER — PHENYLEPHRINE HCL-NACL 10-0.9 MG/250ML-% IV SOLN
INTRAVENOUS | Status: DC | PRN
  Administered 2019-05-09: 50 ug/min via INTRAVENOUS

## 2019-05-09 MED ORDER — ONDANSETRON HCL 4 MG/2ML IJ SOLN
INTRAMUSCULAR | Status: DC | PRN
  Administered 2019-05-09: 4 mg via INTRAVENOUS

## 2019-05-09 MED ORDER — LIDOCAINE HCL (PF) 2 % IJ SOLN
INTRAMUSCULAR | Status: AC
  Filled 2019-05-09: qty 5

## 2019-05-09 MED ORDER — FENTANYL CITRATE (PF) 100 MCG/2ML IJ SOLN
INTRAMUSCULAR | Status: AC
  Filled 2019-05-09: qty 2

## 2019-05-09 MED ORDER — MUPIROCIN 2 % EX OINT
1.0000 "application " | TOPICAL_OINTMENT | Freq: Two times a day (BID) | CUTANEOUS | Status: DC
  Administered 2019-05-09 (×2): 1 via NASAL
  Filled 2019-05-09: qty 22

## 2019-05-09 MED ORDER — IPRATROPIUM-ALBUTEROL 0.5-2.5 (3) MG/3ML IN SOLN
3.0000 mL | Freq: Once | RESPIRATORY_TRACT | Status: AC
  Administered 2019-05-09: 3 mL via RESPIRATORY_TRACT

## 2019-05-09 MED ORDER — PHENYLEPHRINE HCL (PRESSORS) 10 MG/ML IV SOLN
INTRAVENOUS | Status: DC | PRN
  Administered 2019-05-09: 200 ug via INTRAVENOUS
  Administered 2019-05-09: 100 ug via INTRAVENOUS
  Administered 2019-05-09: 200 ug via INTRAVENOUS
  Administered 2019-05-09: 100 ug via INTRAVENOUS
  Administered 2019-05-09: 200 ug via INTRAVENOUS

## 2019-05-09 MED ORDER — MUPIROCIN 2 % EX OINT: 1.0000 "application " | TOPICAL_OINTMENT | Freq: Two times a day (BID) | CUTANEOUS | 0 refills | Status: DC

## 2019-05-09 MED ORDER — PROPOFOL 10 MG/ML IV BOLUS
INTRAVENOUS | Status: AC
  Filled 2019-05-09: qty 20

## 2019-05-09 MED ORDER — PHENYLEPHRINE HCL (PRESSORS) 10 MG/ML IV SOLN
INTRAVENOUS | Status: AC
  Filled 2019-05-09: qty 1

## 2019-05-09 MED ORDER — RANOLAZINE ER 500 MG PO TB12
500.0000 mg | ORAL_TABLET | Freq: Two times a day (BID) | ORAL | Status: DC
  Administered 2019-05-09: 500 mg via ORAL
  Filled 2019-05-09 (×2): qty 1

## 2019-05-09 MED ORDER — ONDANSETRON HCL 4 MG/2ML IJ SOLN
INTRAMUSCULAR | Status: AC
  Filled 2019-05-09: qty 2

## 2019-05-09 MED ORDER — CHLORHEXIDINE GLUCONATE CLOTH 2 % EX PADS: 6.0000 | MEDICATED_PAD | Freq: Every day | CUTANEOUS | Status: DC

## 2019-05-09 MED ORDER — EPHEDRINE SULFATE 50 MG/ML IJ SOLN
INTRAMUSCULAR | Status: AC
  Filled 2019-05-09: qty 1

## 2019-05-09 MED ORDER — SODIUM CHLORIDE 0.9 % IV SOLN
INTRAVENOUS | Status: DC | PRN
  Administered 2019-05-09: 15 mL

## 2019-05-09 MED ORDER — GLYCOPYRROLATE 0.2 MG/ML IJ SOLN
INTRAMUSCULAR | Status: DC | PRN
  Administered 2019-05-09: .2 mg via INTRAVENOUS

## 2019-05-09 MED ORDER — LIDOCAINE HCL (CARDIAC) PF 100 MG/5ML IV SOSY
PREFILLED_SYRINGE | INTRAVENOUS | Status: DC | PRN
  Administered 2019-05-09: 80 mg via INTRAVENOUS

## 2019-05-09 MED ORDER — IPRATROPIUM-ALBUTEROL 0.5-2.5 (3) MG/3ML IN SOLN
RESPIRATORY_TRACT | Status: AC
  Filled 2019-05-09: qty 3

## 2019-05-09 SURGICAL SUPPLY — 36 items
BAG DRAIN CYSTO-URO LG1000N (MISCELLANEOUS) ×4 IMPLANT
BAG URINE DRAIN 2000ML AR STRL (UROLOGICAL SUPPLIES) ×4 IMPLANT
BRUSH SCRUB EZ  4% CHG (MISCELLANEOUS) ×2
BRUSH SCRUB EZ 1% IODOPHOR (MISCELLANEOUS) ×4 IMPLANT
BRUSH SCRUB EZ 4% CHG (MISCELLANEOUS) ×2 IMPLANT
CATH FOL 2WAY LX 20X30 (CATHETERS) ×4 IMPLANT
CATH FOLEY 2WAY 18X30 (CATHETERS) IMPLANT
CATH FOLEY 2WAY SIL 18X30 (CATHETERS)
CATH URETL 5X70 OPEN END (CATHETERS) ×4 IMPLANT
DRAPE UTILITY 15X26 TOWEL STRL (DRAPES) ×4 IMPLANT
DRSG TELFA 4X3 1S NADH ST (GAUZE/BANDAGES/DRESSINGS) ×4 IMPLANT
ELECT LOOP 22F BIPOLAR SML (ELECTROSURGICAL) ×4
ELECT REM PT RETURN 9FT ADLT (ELECTROSURGICAL) ×4
ELECTRODE LOOP 22F BIPOLAR SML (ELECTROSURGICAL) ×2 IMPLANT
ELECTRODE REM PT RTRN 9FT ADLT (ELECTROSURGICAL) ×2 IMPLANT
GLOVE BIO SURGEON STRL SZ8 (GLOVE) ×4 IMPLANT
GOWN STRL REUS W/ TWL LRG LVL3 (GOWN DISPOSABLE) ×4 IMPLANT
GOWN STRL REUS W/ TWL XL LVL3 (GOWN DISPOSABLE) ×2 IMPLANT
GOWN STRL REUS W/TWL LRG LVL3 (GOWN DISPOSABLE) ×4
GOWN STRL REUS W/TWL XL LVL3 (GOWN DISPOSABLE) ×6 IMPLANT
GOWN STRL REUS W/TWL XL LVL4 (GOWN DISPOSABLE) ×4 IMPLANT
GUIDEWIRE STR DUAL SENSOR (WIRE) ×4 IMPLANT
IV NS IRRIG 3000ML ARTHROMATIC (IV SOLUTION) ×8 IMPLANT
KIT TURNOVER CYSTO (KITS) ×4 IMPLANT
LOOP CUT BIPOLAR 24F LRG (ELECTROSURGICAL) IMPLANT
NDL SAFETY ECLIPSE 18X1.5 (NEEDLE) ×2 IMPLANT
NEEDLE HYPO 18GX1.5 SHARP (NEEDLE) ×2
PACK CYSTO AR (MISCELLANEOUS) ×4 IMPLANT
SET CYSTO W/LG BORE CLAMP LF (SET/KITS/TRAYS/PACK) ×4 IMPLANT
SET IRRIG Y TYPE TUR BLADDER L (SET/KITS/TRAYS/PACK) ×4 IMPLANT
SOL .9 NS 3000ML IRR  AL (IV SOLUTION) ×2
SOL .9 NS 3000ML IRR UROMATIC (IV SOLUTION) ×2 IMPLANT
SURGILUBE 2OZ TUBE FLIPTOP (MISCELLANEOUS) ×4 IMPLANT
SYRINGE IRR TOOMEY STRL 70CC (SYRINGE) ×4 IMPLANT
WATER STERILE IRR 1000ML POUR (IV SOLUTION) ×4 IMPLANT
WATER STERILE IRR 3000ML UROMA (IV SOLUTION) ×4 IMPLANT

## 2019-05-09 NOTE — Op Note (Signed)
Preoperative diagnosis: 1. Gross hematuria  Postoperative diagnosis:  1. Bladder tumor (small) 2. Bladder erythema 3. Gross hematuria  Procedure:  1. Cystoscopy 2. Transurethral resection of bladder tumor (small) 3. Bladder biopsies 4. Bilateral retrograde pyelography with interpretation  Surgeon: Nicki Reaper C. Tarence Searcy, M.D.  Anesthesia: General  Complications: None  Intraoperative findings:  1. 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  Not actively bleeding 2. Scattered patchy erythema posterior and right lateral walls 3. Retrograde pyelography: Left ureter normal in caliber without dilation or filling defects.  Collecting system without hydronephrosis or filling defects.  Delicate fornices.  Prompt drainage of system.  Right ureter normal in caliber without dilation or filling defects.  Collecting system normal in appearance with delicate fornices.  Prompt emptying noted   EBL: Minimal  Specimens: 1. Bladder biopsies 2. Bladder tumor   Indication: Jason House is a patient who was found to have a bladder tumor. After reviewing the management options for treatment, he elected to proceed with the above surgical procedure(s). We have discussed the potential benefits and risks of the procedure, side effects of the proposed treatment, the likelihood of the patient achieving the goals of the procedure, and any potential problems that might occur during the procedure or recuperation. Informed consent has been obtained.  Description of procedure:  The patient was taken to the operating room and general anesthesia was induced.  The patient was placed in the dorsal lithotomy position, his catheter was removed and external genitalia were prepped and draped in the usual sterile fashion. Preoperative antibiotics were administered. A preoperative time-out was performed.   A 21 French cystoscope was lubricated and passed per urethra.  The  urethra was normal in caliber without stricture.  The prostatic urethra remarkable for mild lateral lobe enlargement and mild-moderate bladder neck elevation.  No active bleeding was noted from the prostate though there was hypervascularity.  Clot was noted within the bladder which was removed by syringe irrigation.  A moderate amount of clot was obtained.  The bladder was then systematically examined in its entirety.  No active bleeding was noted however there were scattered areas of patchy erythema with raised mucosa on the posterior and right lateral walls as described above.  A papillary tumor was noted as described above.  The ureteral orifices were normal-appearing bilaterally with clear efflux.  The papillary bladder tumor was posterior to the right ureteral orifice.  There was erythematous, edematous mucosa that extended laterally to the lateral wall posterior to the ureteral orifice.  A 5 French open-ended ureteral catheter was placed through the cystoscope into the left ureteral orifice and retrograde pyelogram was performed with findings as described above.  An identical procedure was performed on the contralateral side with findings as described above.  Rigid biopsy forceps were then placed into the cystoscope sheath and cold biopsies were obtained of several areas of the noted erythema on the posterior wall.  The cystoscope was removed and a 24 French continuous-flow resectoscope sheath with visual obturator was passed per urethra.  The Iglesias resectoscope with loop electrode was then placed into the sheath.  The biopsy sites and posterior wall erythema were fulgurated with the loop.  The bladder tumor was then resected entirely down to superficial muscle including the erythematous mucosa that extended laterally taking care to avoid the right ureteral orifice.  Resection site was then fulgurated.  All specimen was removed by irrigation.  Hemostasis was noted be adequate.  The ureteral  orifices were effluxing clear urine.  The resectoscope was removed and a 22 French Foley catheter was placed with return of clear effluent upon irrigation.  After anesthetic reversal he was transported to the PACU in satisfactory condition.  Plan: If urine clear to pink the patient may be discharged home with an indwelling Foley today and will have him follow-up in the office on 3/9 for Foley catheter removal.   Brazen Domangue C. Bernardo Heater,  MD

## 2019-05-09 NOTE — Progress Notes (Signed)
Consent signed for cystoscopy and is in the chart,  Patient tolerated CBI during the night.  Pt complained of no pain or discomfort. Report given to oncoming RN

## 2019-05-09 NOTE — Progress Notes (Signed)
DISCHARGE NOTE:  Pt given discharge instruction. Pt verbalized understanding. Pt and wife educated on foley care and how to empty foley, they verbalized understanding. Foley emptied prior to discharge and is in place. Pt wheeled to car by staff.

## 2019-05-09 NOTE — Anesthesia Procedure Notes (Signed)
Procedure Name: LMA Insertion Date/Time: 05/09/2019 10:20 AM Performed by: Chanetta Marshall, CRNA Pre-anesthesia Checklist: Patient identified, Emergency Drugs available, Suction available and Patient being monitored Patient Re-evaluated:Patient Re-evaluated prior to induction Oxygen Delivery Method: Circle system utilized Preoxygenation: Pre-oxygenation with 100% oxygen Induction Type: IV induction LMA: LMA inserted LMA Size: 4.0 Tube type: Oral Number of attempts: 1 Placement Confirmation: positive ETCO2,  breath sounds checked- equal and bilateral and CO2 detector Tube secured with: Tape Dental Injury: Teeth and Oropharynx as per pre-operative assessment

## 2019-05-09 NOTE — Plan of Care (Signed)

## 2019-05-09 NOTE — Progress Notes (Signed)
Urology Consult  No complaints this morning. VSS, afebrile  CBI effluent translucent but cherry on moderate flow  Plan: Proceed with cystoscopy with clot evacuation/fulguration as previously discussed.  All questions were answered

## 2019-05-09 NOTE — Discharge Instructions (Addendum)
hold aspirin and plavix for at least 48 hours and can call your cardiologist to see if can come off plavix completely.   Transurethral Resection of Bladder Tumor (TURBT) or Bladder Biopsy   Definition:  Transurethral Resection of the Bladder Tumor is a surgical procedure used to diagnose and remove tumors within the bladder. T  General instructions:     Your recent bladder surgery requires very little post hospital care but some definite precautions.  Despite the fact that no skin incisions were used, the area around the bladder incisions are raw and covered with scabs to promote healing and prevent bleeding. Certain precautions are needed to insure that the scabs are not disturbed over the next 2-4 weeks while the healing proceeds.  Because the raw surface inside your bladder and the irritating effects of urine you may expect frequency of urination and/or urgency (a stronger desire to urinate) and perhaps even getting up at night more often. This will usually resolve or improve slowly over the healing period. You may see some blood in your urine over the first 6 weeks. Do not be alarmed, even if the urine was clear for a while. Get off your feet and drink lots of fluids until clearing occurs. If you start to pass clots or don't improve call us.  Diet:  You may return to your normal diet immediately. Because of the raw surface of your bladder, alcohol, spicy foods, foods high in acid and drinks with caffeine may cause irritation or frequency and should be used in moderation. To keep your urine flowing freely and avoid constipation, drink plenty of fluids during the day (8-10 glasses). Tip: Avoid cranberry juice because it is very acidic.  Activity:  Your physical activity doesn't need to be restricted. However, if you are very active, you may see some blood in the urine. We suggest that you reduce your activity under the circumstances until the bleeding has stopped.  Bowels:  It is  important to keep your bowels regular during the postoperative period. Straining with bowel movements can cause bleeding. A bowel movement every other day is reasonable. Use a mild laxative if needed, such as milk of magnesia 2-3 tablespoons, or 2 Dulcolax tablets. Call if you continue to have problems. If you had been taking narcotics for pain, before, during or after your surgery, you may be constipated. Take a laxative if necessary.    Medication:  You should resume your pre-surgery medications unless told not to. In addition you may be given an antibiotic to prevent or treat infection. Antibiotics are not always necessary. All medication should be taken as prescribed until the bottles are finished unless you are having an unusual reaction to one of the drugs.   [place] [address], [place], [place] [address] [phone]

## 2019-05-09 NOTE — Discharge Summary (Signed)
[facility] NAME: [name]    MR#:  [id number]  DATE OF BIRTH:  07-30-1940  DATE OF ADMISSION:  05/07/2019 ADMITTING PHYSICIAN: Loletha Grayer, MD  DATE OF DISCHARGE: 05/09/2019  PRIMARY CARE PHYSICIAN: Idelle Crouch, MD    ADMISSION DIAGNOSIS:  Gross hematuria [R31.0] Hematuria [R31.9]  DISCHARGE DIAGNOSIS:  Active Problems:   Hypothyroidism   Hematuria   SECONDARY DIAGNOSIS:   Past Medical History:  Diagnosis Date  . Anemia   . COPD (chronic obstructive pulmonary disease) ([place])   . Coronary artery disease   . Glaucoma   . H/O ETOH abuse   . History of shingles   . Hyperlipidemia   . Hypertension   . Hypothyroidism     HOSPITAL COURSE:   1.  Hematuria and acute blood loss anemia.  The patient was brought into the hospital and had continuous bladder irrigation.  Urology brought to the operating room for cystoscopy on 05/09/2019.  The bladder was flushed out and a biopsies were taken and a resection of a bladder tumor was done.  Urine was dark yellowish-pinkish upon discharge home.  Patient was advised to follow-up with Dr. Bernardo Heater as outpatient for Foley removal and their office will call him to set up that appointment.  Patient was also advised to hold on the aspirin and Plavix for at least 48 hours.  I recommended that he call his cardiologist on Monday to see if he can come off the Plavix completely.  Hemoglobin did come down to 9.6 with the patient's hematuria and IV fluids that we gave during the hospital course.  Continue to watch hemoglobin as outpatient 2.  Essential hypertension on Bystolic, lisinopril and hydrochlorothiazide 3.  Chronic kidney disease stage IIIa 4.  Hyperlipidemia unspecified on Lipitor 5.  COPD.  Respiratory status stable 6.  Hypothyroidism unspecified on levothyroxine 7.  History of peripheral vascular disease and CAD.  We held aspirin and Plavix while here in the hospital.  Dr.  Bernardo Heater recommends holding for at least another 48 hours.  I advised him to call his cardiologist to see if he can come off the Plavix altogether.  On Bystolic and Lipitor.  DISCHARGE CONDITIONS:   Satisfactory  CONSULTS OBTAINED:  Treatment Team:  Abbie Sons, MD  DRUG ALLERGIES:  No Known Allergies  DISCHARGE MEDICATIONS:   Allergies as of 05/09/2019   No Known Allergies     Medication List    STOP taking these medications   aspirin EC 81 MG tablet   clopidogrel 75 MG tablet Commonly known as: PLAVIX   guaiFENesin 600 MG 12 hr tablet Commonly known as: MUCINEX   hydrochlorothiazide 25 MG tablet Commonly known as: HYDRODIURIL     TAKE these medications   Albuterol Sulfate 108 (90 Base) MCG/ACT Aepb Commonly known as: ProAir RespiClick Inhale 1 puff into the lungs every 4 (four) hours as needed (shortness of breath).   atorvastatin 80 MG tablet Commonly known as: LIPITOR Take 80 mg by mouth every evening.   Bystolic 5 MG tablet Generic drug: nebivolol Take 5 mg by mouth daily.   cyclobenzaprine 10 MG tablet Commonly known as: FLEXERIL Take 10 mg by mouth daily as needed for muscle spasms.   dorzolamide-timolol 22.3-6.8 MG/ML ophthalmic solution Commonly known as: COSOPT Place 1 drop into both eyes 2 (two) times daily.   latanoprost 0.005 % ophthalmic solution Commonly known as: XALATAN Place 1 drop into both eyes at bedtime.  levothyroxine 50 MCG tablet Commonly known as: SYNTHROID Take 50 mcg by mouth daily before breakfast.   lisinopril 10 MG tablet Commonly known as: ZESTRIL Take 10 mg by mouth daily.   mupirocin ointment 2 % Commonly known as: BACTROBAN Place 1 application into the nose 2 (two) times daily.   ranolazine 500 MG 12 hr tablet Commonly known as: RANEXA 500 mg 2 (two) times daily.   vitamin B-12 1000 MCG tablet Commonly known as: CYANOCOBALAMIN Take 1,000 mcg by mouth daily.        DISCHARGE INSTRUCTIONS:    Follow-up PMD 5 days Follow-up Dr. Bernardo Heater urology in a couple days for removal of Foley  If you experience worsening of your admission symptoms, develop shortness of breath, life threatening emergency, suicidal or homicidal thoughts you must seek medical attention immediately by calling 911 or calling your MD immediately  if symptoms less severe.  You Must read complete instructions/literature along with all the possible adverse reactions/side effects for all the Medicines you take and that have been prescribed to you. Take any new Medicines after you have completely understood and accept all the possible adverse reactions/side effects.   Please note  You were cared for by a hospitalist during your hospital stay. If you have any questions about your discharge medications or the care you received while you were in the hospital after you are discharged, you can call the unit and asked to speak with the hospitalist on call if the hospitalist that took care of you is not available. Once you are discharged, your primary care physician will handle any further medical issues. Please note that NO REFILLS for any discharge medications will be authorized once you are discharged, as it is imperative that you return to your primary care physician (or establish a relationship with a primary care physician if you do not have one) for your aftercare needs so that they can reassess your need for medications and monitor your lab values.    Today   CHIEF COMPLAINT:   Chief Complaint  Patient presents with  . Hematuria    HISTORY OF PRESENT ILLNESS:  Jason House  is a 79 y.o. male presented with gross hematuria   VITAL SIGNS:  Blood pressure (!) 131/42, pulse 67, temperature 97.6 F (36.4 C), temperature source Oral, resp. rate 16, height 5\' 7"  (1.702 m), weight 71.7 kg, SpO2 99 %.   PHYSICAL EXAMINATION:  GENERAL:  79 y.o.-year-old patient lying in the bed with no acute distress.  EYES: Pupils  equal, round, reactive to light and accommodation. No scleral icterus. HEENT: Head atraumatic, normocephalic.   LUNGS: Normal breath sounds bilaterally, no wheezing, rales,rhonchi or crepitation. No use of accessory muscles of respiration.  CARDIOVASCULAR: S1, S2 normal. No murmurs, rubs, or gallops.  ABDOMEN: Soft, non-tender, non-distended. Bowel sounds present. No organomegaly or mass.  EXTREMITIES: No pedal edema, cyanosis, or clubbing.  NEUROLOGIC: Cranial nerves II through XII are intact. Muscle strength 5/5 in all extremities. Sensation intact. Gait not checked.  PSYCHIATRIC: The patient is alert and oriented x 3.  SKIN: No obvious rash, lesion, or ulcer.   DATA REVIEW:   CBC Recent Labs  Lab 05/09/19 0612  WBC 12.4*  HGB 9.6*  HCT 28.3*  PLT 224    Chemistries  Recent Labs  Lab 05/09/19 0612  NA 135  K 4.7  CL 105  CO2 23  GLUCOSE 97  BUN 33*  CREATININE 1.44*  CALCIUM 8.8*    Microbiology Results  Results  for orders placed or performed during the hospital encounter of 05/07/19  Respiratory Panel by RT PCR (Flu A&B, Covid) - Nasopharyngeal Swab     Status: None   Collection Time: 05/07/19  4:12 PM   Specimen: Nasopharyngeal Swab  Result Value Ref Range Status   SARS Coronavirus 2 by RT PCR NEGATIVE NEGATIVE Final    Comment: (NOTE) SARS-CoV-2 target nucleic acids are NOT DETECTED. The SARS-CoV-2 RNA is generally detectable in upper respiratoy specimens during the acute phase of infection. The lowest concentration of SARS-CoV-2 viral copies this assay can detect is 131 copies/mL. A negative result does not preclude SARS-Cov-2 infection and should not be used as the sole basis for treatment or other patient management decisions. A negative result may occur with  improper specimen collection/handling, submission of specimen other than nasopharyngeal swab, presence of viral mutation(s) within the areas targeted by this assay, and inadequate number of viral  copies (<131 copies/mL). A negative result must be combined with clinical observations, patient history, and epidemiological information. The expected result is Negative. Fact Sheet for Patients:  PinkCheek.be Fact Sheet for Healthcare Providers:  GravelBags.it This test is not yet ap proved or cleared by the [place] FDA and  has been authorized for detection and/or diagnosis of SARS-CoV-2 by FDA under an Emergency Use Authorization (EUA). This EUA will remain  in effect (meaning this test can be used) for the duration of the COVID-19 declaration under Section 564(b)(1) of the Act, 21 U.S.C. section 360bbb-3(b)(1), unless the authorization is terminated or revoked sooner.    Influenza A by PCR NEGATIVE NEGATIVE Final   Influenza B by PCR NEGATIVE NEGATIVE Final    Comment: (NOTE) The Xpert Xpress SARS-CoV-2/FLU/RSV assay is intended as an aid in  the diagnosis of influenza from Nasopharyngeal swab specimens and  should not be used as a sole basis for treatment. Nasal washings and  aspirates are unacceptable for Xpert Xpress SARS-CoV-2/FLU/RSV  testing. Fact Sheet for Patients: PinkCheek.be Fact Sheet for Healthcare Providers: GravelBags.it This test is not yet approved or cleared by the [place] FDA and  has been authorized for detection and/or diagnosis of SARS-CoV-2 by  FDA under an Emergency Use Authorization (EUA). This EUA will remain  in effect (meaning this test can be used) for the duration of the  Covid-19 declaration under Section 564(b)(1) of the Act, 21  U.S.C. section 360bbb-3(b)(1), unless the authorization is  terminated or revoked. Performed at [facility], [address], [place], [place] [address]   Surgical pcr screen     Status: Abnormal   Collection Time: 05/08/19 11:55 PM   Specimen: Nasal Mucosa; Nasal Swab  Result  Value Ref Range Status   MRSA, PCR POSITIVE (A) NEGATIVE Final    Comment: RESULT CALLED TO, READ BACK BY AND VERIFIED WITH: RASHIDA HANE ON 05/09/19 AT [facility]    Staphylococcus aureus POSITIVE (A) NEGATIVE Final    Comment: (NOTE) The Xpert SA Assay (FDA approved for NASAL specimens in patients 51 years of age and older), is one component of a comprehensive surveillance program. It is not intended to diagnose infection nor to guide or monitor treatment. Performed at [facility], [address] [place], [place] [address]     RADIOLOGY:  DG OR UROLOGY CYSTO IMAGE [facility] ONLY)  Result Date: 05/09/2019 There is no interpretation for this exam.  This order is for images obtained during a surgical procedure.  Please See "Surgeries" Tab for more information regarding the procedure.  Management plans discussed with the patient, family and they are in agreement.  CODE STATUS:     Code Status Orders  (From admission, onward)         Start     Ordered   05/07/19 1634  Full code  Continuous     05/07/19 1633        Code Status History    Date Active Date Inactive Code Status Order ID Comments User Context   02/12/2018 1650 02/13/2018 1601 Full Code [id number]  Algernon Huxley, MD Inpatient   03/27/2017 2018 04/01/2017 2006 Full Code [id number]  Bettey Costa, MD Inpatient   Advance Care Planning Activity      TOTAL TIME TAKING CARE OF THIS PATIENT: 35 minutes.    Loletha Grayer M.D on 05/09/2019 at 2:12 PM  Between 7am to 6pm - Pager - [phone]  After 6pm go to [email or web address] - password [email or web address]  [facility]  CC: Primary care physician; Idelle Crouch, MD

## 2019-05-09 NOTE — Progress Notes (Signed)
15 minute call to floor. 

## 2019-05-09 NOTE — Anesthesia Preprocedure Evaluation (Signed)
Anesthesia Evaluation  Patient identified by MRN, date of birth, ID band Patient awake    Reviewed: Allergy & Precautions, H&P , NPO status , reviewed documented beta blocker date and time   Airway Mallampati: II  TM Distance: >3 FB Neck ROM: limited    Dental  (+) Edentulous Upper, Edentulous Lower   Pulmonary COPD, former smoker,           Cardiovascular hypertension, + CAD and + Peripheral Vascular Disease       Neuro/Psych    GI/Hepatic   Endo/Other  Hypothyroidism   Renal/GU Renal disease     Musculoskeletal  (+) Arthritis ,   Abdominal   Peds  Hematology  (+) Blood dyscrasia, anemia ,   Anesthesia Other Findings Past Medical History: No date: Anemia No date: COPD (chronic obstructive pulmonary disease) ([facility]) No date: Coronary artery disease No date: Glaucoma No date: H/O ETOH abuse No date: History of shingles No date: Hyperlipidemia No date: Hypertension No date: Hypothyroidism Past Surgical History: 01/18/2019: CHOLECYSTECTOMY; N/A     Comment:  Procedure: LAPAROSCOPIC CHOLECYSTECTOMY WITH               INTRAOPERATIVE CHOLANGIOGRAM;  Surgeon: Olean Ree,               MD;  Location: [facility] ORS;  Service: General;  Laterality:               N/A; 09/23/2014: COLONOSCOPY WITH PROPOFOL; N/A     Comment:  Procedure: COLONOSCOPY WITH PROPOFOL;  Surgeon: Josefine Class, MD;  Location: [facility] ENDOSCOPY;  Service:               Endoscopy;  Laterality: N/A; No date: CORONARY ANGIOPLASTY No date: CORONARY ARTERY BYPASS GRAFT 02/12/2018: ENDARTERECTOMY; Right     Comment:  Procedure: ENDARTERECTOMY CAROTID;  Surgeon: Algernon Huxley, MD;  Location: [facility] ORS;  Service: Vascular;                Laterality: Right; 04/22/2017: ERCP; N/A     Comment:  Procedure: ENDOSCOPIC RETROGRADE               CHOLANGIOPANCREATOGRAPHY (ERCP);  Surgeon: Lucilla Lame,               MD;  Location:  [facility] ENDOSCOPY;  Service: Endoscopy;                Laterality: N/A; No date: EYE SURGERY; Bilateral     Comment:  cataract BMI    Body Mass Index: 24.75 kg/m     Reproductive/Obstetrics                             Anesthesia Physical Anesthesia Plan  ASA: III and emergent  Anesthesia Plan: General   Post-op Pain Management:    Induction: Intravenous  PONV Risk Score and Plan: Ondansetron and Treatment may vary due to age or medical condition  Airway Management Planned: LMA  Additional Equipment:   Intra-op Plan:   Post-operative Plan: Extubation in OR  Informed Consent: I have reviewed the patients History and Physical, chart, labs and discussed the procedure including the risks, benefits and alternatives for the proposed anesthesia with the patient or authorized representative who has indicated his/her understanding and acceptance.  Dental Advisory Given  Plan Discussed with: CRNA  Anesthesia Plan Comments:         Anesthesia Quick Evaluation

## 2019-05-09 NOTE — Transfer of Care (Signed)
Immediate Anesthesia Transfer of Care Note  Patient: Jason House  Procedure(s) Performed: CYSTOSCOPY WITH RETROGRADE PYELOGRAM (Bilateral ) EVACUATION HEMATOMA (N/A ) TRANSURETHRAL RESECTION OF BLADDER TUMOR (TURBT) (N/A ) CYSTOSCOPY WITH BIOPSY (N/A )  Patient Location: PACU  Anesthesia Type:General  Level of Consciousness: awake, alert  and oriented  Airway & Oxygen Therapy: Patient Spontanous Breathing and Patient connected to face mask oxygen  Post-op Assessment: Report given to RN and Post -op Vital signs reviewed and stable  Post vital signs: Reviewed and stable  Last Vitals:  Vitals Value Taken Time  BP 117/44 05/09/19 1114  Temp    Pulse 68 05/09/19 1115  Resp 21 05/09/19 1115  SpO2 100 % 05/09/19 1115  Vitals shown include unvalidated device data.  Last Pain:  Vitals:   05/09/19 0829  TempSrc:   PainSc: 0-No pain         Complications: No apparent anesthesia complications

## 2019-05-11 ENCOUNTER — Encounter: Payer: Self-pay | Admitting: Physician Assistant

## 2019-05-11 ENCOUNTER — Ambulatory Visit: Payer: PPO | Admitting: Physician Assistant

## 2019-05-11 ENCOUNTER — Other Ambulatory Visit: Payer: Self-pay

## 2019-05-11 VITALS — BP 130/70 | Ht 66.0 in | Wt 158.0 lb

## 2019-05-11 DIAGNOSIS — D494 Neoplasm of unspecified behavior of bladder: Secondary | ICD-10-CM

## 2019-05-11 LAB — SURGICAL PATHOLOGY

## 2019-05-11 NOTE — Patient Instructions (Signed)
Transurethral Resection of Bladder Tumor, Care After This sheet gives you information about how to care for yourself after your procedure. Your health care provider may also give you more specific instructions. If you have problems or questions, contact your health care provider. What can I expect after the procedure? After the procedure, it is common to have:  A small amount of blood in your urine for up to 2 weeks.  Soreness or mild pain from your catheter. After your catheter is removed, you may have mild soreness, especially when urinating.  Pain in your lower abdomen. Follow these instructions at home: Medicines   Take over-the-counter and prescription medicines only as told by your health care provider.  If you were prescribed an antibiotic medicine, take it as told by your health care provider. Do not stop taking the antibiotic even if you start to feel better.  Do not drive for 24 hours if you were given a sedative during your procedure.  Ask your health care provider if the medicine prescribed to you: ? Requires you to avoid driving or using heavy machinery. ? Can cause constipation. You may need to take these actions to prevent or treat constipation:  Take over-the-counter or prescription medicines.  Eat foods that are high in fiber, such as beans, whole grains, and fresh fruits and vegetables.  Limit foods that are high in fat and processed sugars, such as fried or sweet foods. Activity  Return to your normal activities as told by your health care provider. Ask your health care provider what activities are safe for you.  Do not lift anything that is heavier than 10 lb (4.5 kg), or the limit that you are told, until your health care provider says that it is safe.  Avoid intense physical activity for as long as told by your health care provider.  Rest as told by your health care provider.  Avoid sitting for a long time without moving. Get up to take short walks every  1-2 hours. This is important to improve blood flow and breathing. Ask for help if you feel weak or unsteady. General instructions   Do not drink alcohol for as long as told by your health care provider. This is especially important if you are taking prescription pain medicines.  Do not take baths, swim, or use a hot tub until your health care provider approves. Ask your health care provider if you may take showers. You may only be allowed to take sponge baths.  If you have a catheter, follow instructions from your health care provider about caring for your catheter and your drainage bag.  Drink enough fluid to keep your urine pale yellow.  Wear compression stockings as told by your health care provider. These stockings help to prevent blood clots and reduce swelling in your legs.  Keep all follow-up visits as told by your health care provider. This is important. ? You will need to be followed closely with regular checks of your bladder and urethra (cystoscopies) to make sure that the cancer does not come back. Contact a health care provider if:  You have pain that gets worse or does not improve with medicine.  You have blood in your urine for more than 2 weeks.  You have cloudy or bad-smelling urine.  You become constipated. Signs of constipation may include having: ? Fewer than three bowel movements in a week. ? Difficulty having a bowel movement. ? Stools that are dry, hard, or larger than normal.  You have  a fever. Get help right away if:  You have: ? Severe pain. ? Bright red blood in your urine. ? Blood clots in your urine. ? A lot of blood in your urine.  Your catheter has been removed and you are not able to urinate.  You have a catheter in place and the catheter is not draining urine. Summary  After your procedure, it is common to have a small amount of blood in your urine, soreness or mild pain from your catheter, and pain in your lower abdomen.  Take  over-the-counter and prescription medicines only as told by your health care provider.  Rest as told by your health care provider. Follow your health care provider's instructions about returning to normal activities. Ask what activities are safe for you.  If you have a catheter, follow instructions from your health care provider about caring for your catheter and your drainage bag.  Get help right away if you cannot urinate, you have severe pain, or you have bright red blood or blood clots in your urine. This information is not intended to replace advice given to you by your health care provider. Make sure you discuss any questions you have with your health care provider. Document Revised: 09/18/2017 Document Reviewed: 09/18/2017 Elsevier Patient Education  [place].

## 2019-05-11 NOTE — Progress Notes (Signed)
Catheter Removal  Patient is present today for a catheter removal.  71ml of water was drained from the balloon. A 20FR foley cath was removed from the bladder no complications were noted . Patient tolerated well.  Performed by: Debroah Loop, PA-C   Follow up/ Additional notes: Return in about 4 weeks (around 06/08/2019) for Postop f/u with Dr. Bernardo Heater. Counseled to resume anticoagulation tomorrow (POD3).

## 2019-05-13 ENCOUNTER — Telehealth: Payer: Self-pay | Admitting: Urology

## 2019-05-13 NOTE — Anesthesia Postprocedure Evaluation (Signed)
Anesthesia Post Note  Patient: Comer Devins Fritze  Procedure(s) Performed: CYSTOSCOPY WITH RETROGRADE PYELOGRAM (Bilateral ) EVACUATION HEMATOMA (N/A ) TRANSURETHRAL RESECTION OF BLADDER TUMOR (TURBT) (N/A ) CYSTOSCOPY WITH BIOPSY (N/A )  Patient location during evaluation: PACU Anesthesia Type: General Level of consciousness: awake and alert Pain management: pain level controlled Vital Signs Assessment: post-procedure vital signs reviewed and stable Respiratory status: spontaneous breathing, nonlabored ventilation and respiratory function stable Cardiovascular status: blood pressure returned to baseline and stable Postop Assessment: no apparent nausea or vomiting Anesthetic complications: no     Last Vitals:  Vitals:   05/09/19 1201 05/09/19 1531  BP: (!) 131/42 (!) 117/51  Pulse: 67 61  Resp:  20  Temp: 36.4 C (!) 36.3 C  SpO2: 99% 98%    Last Pain:  Vitals:   05/09/19 1531  TempSrc: Oral  PainSc:                  Alphonsus Sias

## 2019-05-13 NOTE — Telephone Encounter (Signed)
I contacted Jason House to discuss his bladder pathology results.  Bladder biopsy showed areas of chronic inflammation with some associated atypical urothelium.  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  Will schedule him a follow-up in approximately 2 weeks to discuss BCG further.

## 2019-05-13 NOTE — Telephone Encounter (Signed)
done

## 2019-05-14 DIAGNOSIS — I739 Peripheral vascular disease, unspecified: Secondary | ICD-10-CM | POA: Diagnosis not present

## 2019-05-14 DIAGNOSIS — H401121 Primary open-angle glaucoma, left eye, mild stage: Secondary | ICD-10-CM | POA: Diagnosis not present

## 2019-05-14 DIAGNOSIS — I2510 Atherosclerotic heart disease of native coronary artery without angina pectoris: Secondary | ICD-10-CM | POA: Diagnosis not present

## 2019-05-14 DIAGNOSIS — D649 Anemia, unspecified: Secondary | ICD-10-CM | POA: Diagnosis not present

## 2019-05-14 DIAGNOSIS — I10 Essential (primary) hypertension: Secondary | ICD-10-CM | POA: Diagnosis not present

## 2019-05-14 DIAGNOSIS — C679 Malignant neoplasm of bladder, unspecified: Secondary | ICD-10-CM | POA: Diagnosis not present

## 2019-05-14 DIAGNOSIS — E782 Mixed hyperlipidemia: Secondary | ICD-10-CM | POA: Diagnosis not present

## 2019-05-25 ENCOUNTER — Ambulatory Visit (INDEPENDENT_AMBULATORY_CARE_PROVIDER_SITE_OTHER): Payer: PPO | Admitting: Vascular Surgery

## 2019-05-25 ENCOUNTER — Other Ambulatory Visit: Payer: Self-pay

## 2019-05-25 ENCOUNTER — Encounter (INDEPENDENT_AMBULATORY_CARE_PROVIDER_SITE_OTHER): Payer: Self-pay | Admitting: Vascular Surgery

## 2019-05-25 ENCOUNTER — Ambulatory Visit (INDEPENDENT_AMBULATORY_CARE_PROVIDER_SITE_OTHER): Payer: PPO

## 2019-05-25 VITALS — BP 131/57 | HR 55 | Resp 16 | Ht 65.0 in | Wt 161.0 lb

## 2019-05-25 DIAGNOSIS — I10 Essential (primary) hypertension: Secondary | ICD-10-CM | POA: Diagnosis not present

## 2019-05-25 DIAGNOSIS — E785 Hyperlipidemia, unspecified: Secondary | ICD-10-CM

## 2019-05-25 DIAGNOSIS — I6523 Occlusion and stenosis of bilateral carotid arteries: Secondary | ICD-10-CM | POA: Diagnosis not present

## 2019-05-25 NOTE — Progress Notes (Signed)
MRN : [id number]  Jason House is a 79 y.o. (06/28/1940) male who presents with chief complaint of  Chief Complaint  Patient presents with  . Follow-up    ultrasound follow up   .  History of Present Illness: Patient returns in follow-up of his carotid disease.  He is a little over a year status post right carotid endarterectomy for high-grade stenosis.  He is doing well today without any focal neurologic symptoms. Specifically, the patient denies amaurosis fugax, speech or swallowing difficulties, or arm or leg weakness or numbness.  Carotid duplex today reveals mildly elevated velocities in the endarterectomy site and the left carotid artery slightly worse than his last study but on the lower end of the 40-59 range.  Current Outpatient Medications  Medication Sig Dispense Refill  . Albuterol Sulfate (PROAIR RESPICLICK) 294 (90 Base) MCG/ACT AEPB Inhale 1 puff into the lungs every 4 (four) hours as needed (shortness of breath). 1 each 0  . atorvastatin (LIPITOR) 80 MG tablet Take 80 mg by mouth every evening.     Marland Kitchen BYSTOLIC 5 MG tablet Take 5 mg by mouth daily.  1  . cyclobenzaprine (FLEXERIL) 10 MG tablet Take 10 mg by mouth daily as needed for muscle spasms.     . dorzolamide-timolol (COSOPT) 22.3-6.8 MG/ML ophthalmic solution Place 1 drop into both eyes 2 (two) times daily.    Marland Kitchen latanoprost (XALATAN) 0.005 % ophthalmic solution Place 1 drop into both eyes at bedtime.    Marland Kitchen levothyroxine (SYNTHROID, LEVOTHROID) 50 MCG tablet Take 50 mcg by mouth daily before breakfast.     . lisinopril (PRINIVIL,ZESTRIL) 10 MG tablet Take 10 mg by mouth daily.  1  . mupirocin ointment (BACTROBAN) 2 % Place 1 application into the nose 2 (two) times daily. 22 g 0  . ranolazine (RANEXA) 500 MG 12 hr tablet 500 mg 2 (two) times daily.   2  . vitamin B-12 (CYANOCOBALAMIN) 1000 MCG tablet Take 1,000 mcg by mouth daily.     No current facility-administered medications for this visit.    Past Medical  History:  Diagnosis Date  . Anemia   . COPD (chronic obstructive pulmonary disease) ([place])   . Coronary artery disease   . Glaucoma   . H/O ETOH abuse   . History of shingles   . Hyperlipidemia   . Hypertension   . Hypothyroidism     Past Surgical History:  Procedure Laterality Date  . CHOLECYSTECTOMY N/A 01/18/2019   Procedure: LAPAROSCOPIC CHOLECYSTECTOMY WITH INTRAOPERATIVE CHOLANGIOGRAM;  Surgeon: Olean Ree, MD;  Location: [facility] ORS;  Service: General;  Laterality: N/A;  . COLONOSCOPY WITH PROPOFOL N/A 09/23/2014   Procedure: COLONOSCOPY WITH PROPOFOL;  Surgeon: Josefine Class, MD;  Location: [facility] ENDOSCOPY;  Service: Endoscopy;  Laterality: N/A;  . CORONARY ANGIOPLASTY    . CORONARY ARTERY BYPASS GRAFT    . CYSTOSCOPY W/ RETROGRADES Bilateral 05/09/2019   Procedure: CYSTOSCOPY WITH RETROGRADE PYELOGRAM;  Surgeon: Abbie Sons, MD;  Location: [facility] ORS;  Service: Urology;  Laterality: Bilateral;  . CYSTOSCOPY WITH BIOPSY N/A 05/09/2019   Procedure: CYSTOSCOPY WITH BIOPSY;  Surgeon: Abbie Sons, MD;  Location: [facility] ORS;  Service: Urology;  Laterality: N/A;  . ENDARTERECTOMY Right 02/12/2018   Procedure: ENDARTERECTOMY CAROTID;  Surgeon: Algernon Huxley, MD;  Location: [facility] ORS;  Service: Vascular;  Laterality: Right;  . ERCP N/A 04/22/2017   Procedure: ENDOSCOPIC RETROGRADE CHOLANGIOPANCREATOGRAPHY (ERCP);  Surgeon: Lucilla Lame, MD;  Location: [facility] ENDOSCOPY;  Service:  Endoscopy;  Laterality: N/A;  . EYE SURGERY Bilateral    cataract  . HEMATOMA EVACUATION N/A 05/09/2019   Procedure: EVACUATION HEMATOMA;  Surgeon: Abbie Sons, MD;  Location: [facility] ORS;  Service: Urology;  Laterality: N/A;  . TRANSURETHRAL RESECTION OF BLADDER TUMOR N/A 05/09/2019   Procedure: TRANSURETHRAL RESECTION OF BLADDER TUMOR (TURBT);  Surgeon: Abbie Sons, MD;  Location: [facility] ORS;  Service: Urology;  Laterality: N/A;     Social History   Tobacco Use  . Smoking status: Former Research scientist (life sciences)  .  Smokeless tobacco: Never Used  . Tobacco comment: 30 years ago  Substance Use Topics  . Alcohol use: No    Comment: stopped 2010  . Drug use: No    Family History  Problem Relation Age of Onset  . Diabetes Mother   . CAD Mother   . Cancer Sister   . Throat cancer Brother   . Cirrhosis Brother   . COPD Sister   . COPD Sister   . Diabetes Sister      No Known Allergies   REVIEW OF SYSTEMS (Negative unless checked)  Constitutional: [] Weight loss  [] Fever  [] Chills Cardiac: [] Chest pain   [] Chest pressure   [] Palpitations   [] Shortness of breath when laying flat   [] Shortness of breath at rest   [] Shortness of breath with exertion. Vascular:  [] Pain in legs with walking   [] Pain in legs at rest   [] Pain in legs when laying flat   [] Claudication   [] Pain in feet when walking  [] Pain in feet at rest  [] Pain in feet when laying flat   [] History of DVT   [] Phlebitis   [] Swelling in legs   [] Varicose veins   [] Non-healing ulcers Pulmonary:   [] Uses home oxygen   [] Productive cough   [] Hemoptysis   [] Wheeze  [x] COPD   [] Asthma Neurologic:  [] Dizziness  [] Blackouts   [] Seizures   [] History of stroke   [] History of TIA  [] Aphasia   [] Temporary blindness   [] Dysphagia   [] Weakness or numbness in arms   [] Weakness or numbness in legs Musculoskeletal:  [x] Arthritis   [] Joint swelling   [] Joint pain   [] Low back pain Hematologic:  [] Easy bruising  [] Easy bleeding   [] Hypercoagulable state   [x] Anemic  [] Hepatitis Gastrointestinal:  [] Blood in stool   [] Vomiting blood  [x] Gastroesophageal reflux/heartburn   [] Difficulty swallowing. Genitourinary:  [] Chronic kidney disease   [] Difficult urination  [] Frequent urination  [] Burning with urination   [] Blood in urine Skin:  [] Rashes   [] Ulcers   [] Wounds Psychological:  [] History of anxiety   []  History of major depression.  Physical Examination  Vitals:   05/25/19 1143  BP: (!) 131/57  Pulse: (!) 55  Resp: 16  Weight: 161 lb (73 kg)  Height:  5\' 5"  (1.651 m)   Body mass index is 26.79 kg/m. Gen:  WD/WN, NAD.  Appears younger than stated age Head: [place]/AT, No temporalis wasting. Ear/Nose/Throat: Hearing grossly intact, nares w/o erythema or drainage, trachea midline Eyes: Conjunctiva clear. Sclera non-icteric Neck: Supple.  No bruit  Pulmonary:  Good air movement, equal and clear to auscultation bilaterally.  Cardiac: RRR, No JVD Vascular:  Vessel Right Left  Radial Palpable Palpable           Musculoskeletal: M/S 5/5 throughout.  No deformity or atrophy.  No edema. Neurologic: CN 2-12 intact. Sensation grossly intact in extremities.  Symmetrical.  Speech is fluent. Motor exam as listed above. Psychiatric: Judgment intact, Mood & affect  appropriate for pt's clinical situation. Dermatologic: No rashes or ulcers noted.  No cellulitis or open wounds.      CBC Lab Results  Component Value Date   WBC 12.4 (H) 05/09/2019   HGB 9.6 (L) 05/09/2019   HCT 28.3 (L) 05/09/2019   MCV 96.3 05/09/2019   PLT 224 05/09/2019    BMET    Component Value Date/Time   NA 135 05/09/2019 0612   K 4.7 05/09/2019 0612   CL 105 05/09/2019 0612   CO2 23 05/09/2019 0612   GLUCOSE 97 05/09/2019 0612   BUN 33 (H) 05/09/2019 0612   CREATININE 1.44 (H) 05/09/2019 0612   CREATININE 1.29 07/05/2013 1004   CALCIUM 8.8 (L) 05/09/2019 0612   CALCIUM 9.2 07/05/2013 1004   GFRNONAA 46 (L) 05/09/2019 0612   GFRNONAA 55 (L) 07/05/2013 1004   GFRAA 53 (L) 05/09/2019 0612   GFRAA >60 07/05/2013 1004   Estimated Creatinine Clearance: 36.2 mL/min (A) (by C-G formula based on SCr of 1.44 mg/dL (H)).  COAG Lab Results  Component Value Date   INR 1.0 01/12/2019   INR 1.03 02/06/2018   INR 0.98 05/20/2017    Radiology DG OR UROLOGY CYSTO IMAGE ([place])  Result Date: 05/09/2019 There is no interpretation for this exam.  This order is for images obtained during a surgical procedure.  Please See "Surgeries" Tab for more information regarding  the procedure.     Assessment/Plan Essential hypertension blood pressure control important in reducing the progression of atherosclerotic disease. On appropriate oral medications.   Hyperlipidemia lipid control important in reducing the progression of atherosclerotic disease. Continue statin therapy  Bilateral carotid artery stenosis Carotid duplex today reveals mildly elevated velocities in the endarterectomy site and the left carotid artery slightly worse than his last study but on the lower end of the 40-59 range.  No role for intervention at this level.  Plan to follow on an annual basis at this point.    Leotis Pain, MD  05/25/2019 12:09 PM    This note was created with Dragon medical transcription system.  Any errors from dictation are purely unintentional

## 2019-05-25 NOTE — Assessment & Plan Note (Signed)
Carotid duplex today reveals mildly elevated velocities in the endarterectomy site and the left carotid artery slightly worse than his last study but on the lower end of the 40-59 range.  No role for intervention at this level.  Plan to follow on an annual basis at this point.

## 2019-05-27 NOTE — Progress Notes (Signed)
Above management if I can take strawberries  05/28/19 2:24 PM   Jason House [id number] June 20, 1940 [id number]  Referring provider: Idelle Crouch, MD [facility] [place],  [place] [address]  Chief Complaint  Patient presents with  . Follow-up    HPI: 79 yo M who returns today for the evaluation and management of bladder tumor.  -s/p cystoscopy with bx and TURBT 05/09/2018 -bladder bx showed areas of chronic inflammation with some associated atypical urothelium.  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 Anemia   . COPD (chronic obstructive pulmonary disease) ([place])   . Coronary artery disease   . Glaucoma   . H/O ETOH abuse   . History of shingles   . Hyperlipidemia   . Hypertension   . Hypothyroidism     Surgical History: Past Surgical History:  Procedure Laterality Date  . CHOLECYSTECTOMY N/A 01/18/2019   Procedure: LAPAROSCOPIC CHOLECYSTECTOMY WITH INTRAOPERATIVE CHOLANGIOGRAM;  Surgeon: Olean Ree, MD;  Location: [facility] ORS;  Service: General;  Laterality: N/A;  . COLONOSCOPY WITH PROPOFOL N/A 09/23/2014   Procedure: COLONOSCOPY WITH PROPOFOL;  Surgeon: Josefine Class, MD;  Location: [facility] ENDOSCOPY;  Service: Endoscopy;  Laterality: N/A;  . CORONARY ANGIOPLASTY    . CORONARY ARTERY BYPASS GRAFT    . CYSTOSCOPY W/ RETROGRADES Bilateral 05/09/2019   Procedure: CYSTOSCOPY WITH RETROGRADE PYELOGRAM;  Surgeon: Abbie Sons, MD;  Location: [facility] ORS;  Service: Urology;  Laterality: Bilateral;  . CYSTOSCOPY WITH BIOPSY N/A 05/09/2019   Procedure: CYSTOSCOPY WITH BIOPSY;  Surgeon: Abbie Sons, MD;  Location: [facility] ORS;  Service: Urology;  Laterality: N/A;  . ENDARTERECTOMY Right 02/12/2018   Procedure: ENDARTERECTOMY CAROTID;  Surgeon: Algernon Huxley, MD;   Location: [facility] ORS;  Service: Vascular;  Laterality: Right;  . ERCP N/A 04/22/2017   Procedure: ENDOSCOPIC RETROGRADE CHOLANGIOPANCREATOGRAPHY (ERCP);  Surgeon: Lucilla Lame, MD;  Location: [facility] ENDOSCOPY;  Service: Endoscopy;  Laterality: N/A;  . EYE SURGERY Bilateral    cataract  . HEMATOMA EVACUATION N/A 05/09/2019   Procedure: EVACUATION HEMATOMA;  Surgeon: Abbie Sons, MD;  Location: [facility] ORS;  Service: Urology;  Laterality: N/A;  . TRANSURETHRAL RESECTION OF BLADDER TUMOR N/A 05/09/2019   Procedure: TRANSURETHRAL RESECTION OF BLADDER TUMOR (TURBT);  Surgeon: Abbie Sons, MD;  Location: [facility] ORS;  Service: Urology;  Laterality: N/A;    Home Medications:  Allergies as of 05/28/2019   No Known Allergies     Medication List       Accurate as of May 28, 2019  2:24 PM. If you have any questions, ask your nurse or doctor.        Albuterol Sulfate 108 (90 Base) MCG/ACT Aepb Commonly known as: ProAir RespiClick Inhale 1 puff into the lungs every 4 (four) hours as needed (shortness of breath).   atorvastatin 80 MG tablet Commonly known as: LIPITOR Take 80 mg by mouth every evening.   Bystolic 5 MG tablet Generic drug: nebivolol Take 5 mg by mouth daily.   clopidogrel 75 MG tablet Commonly known as: PLAVIX Take 75 mg by mouth daily.   cyclobenzaprine 10 MG tablet Commonly known as: FLEXERIL Take 10 mg by mouth daily as needed for muscle spasms.   dorzolamide-timolol 22.3-6.8 MG/ML ophthalmic solution Commonly known as: COSOPT Place 1 drop into both eyes 2 (two) times  daily.   latanoprost 0.005 % ophthalmic solution Commonly known as: XALATAN Place 1 drop into both eyes at bedtime.   levothyroxine 50 MCG tablet Commonly known as: SYNTHROID Take 50 mcg by mouth daily before breakfast.   lisinopril 10 MG tablet Commonly known as: ZESTRIL Take 10 mg by mouth daily.   mupirocin ointment 2 % Commonly known as: BACTROBAN Place 1 application into the nose 2 (two)  times daily.   ranolazine 500 MG 12 hr tablet Commonly known as: RANEXA 500 mg 2 (two) times daily.   vitamin B-12 1000 MCG tablet Commonly known as: CYANOCOBALAMIN Take 1,000 mcg by mouth daily.       Allergies: No Known Allergies  Family History: Family History  Problem Relation Age of Onset  . Diabetes Mother   . CAD Mother   . Cancer Sister   . Throat cancer Brother   . Cirrhosis Brother   . COPD Sister   . COPD Sister   . Diabetes Sister     Social History:  reports that he has quit smoking. He has never used smokeless tobacco. He reports that he does not drink alcohol or use drugs.   Physical Exam: BP 136/82   Pulse 77   Ht 5\' 5"  (1.651 m)   Wt 161 lb (73 kg)   BMI 26.79 kg/m   Constitutional:  Alert and oriented, No acute distress. HEENT: [place] AT, moist mucus membranes.  Trachea midline, no masses. Cardiovascular: No clubbing, cyanosis, or edema. Respiratory: Normal respiratory effort, no increased work of breathing. Skin: No rashes, bruises or suspicious lesions. Neurologic: Grossly intact, no focal deficits, moving all 4 extremities. Psychiatric: Normal mood and affect.   Assessment & Plan:    1. Urothelial carcinoma in situ  S/p cystoscopy with bx and TURBT, no complications  Bladder bx showed low grade noninvasive urothelial carcinoma however adjacent urothelial carcinoma in situ  BCG induction therapy recommended and discussed along with most common side effects including frequency, urgency, burning with urination, low incidence of flu like symptoms and rare incidence of BCG sepsis.  Pt agreed to start BCG therapy in 3-4 weeks.   Abbie Sons, [place] [address] [place], [place] [address] [phone]  I, Jason House, am acting as a scribe for Dr. Nicki Reaper C. Tansy Lorek,  I have reviewed the above documentation for accuracy and completeness, and I agree with the above.    Abbie Sons, MD

## 2019-05-28 ENCOUNTER — Other Ambulatory Visit: Payer: Self-pay

## 2019-05-28 ENCOUNTER — Encounter: Payer: Self-pay | Admitting: Urology

## 2019-05-28 ENCOUNTER — Ambulatory Visit: Payer: PPO | Admitting: Urology

## 2019-05-28 VITALS — BP 136/82 | HR 77 | Ht 65.0 in | Wt 161.0 lb

## 2019-05-28 DIAGNOSIS — C689 Malignant neoplasm of urinary organ, unspecified: Secondary | ICD-10-CM | POA: Diagnosis not present

## 2019-05-28 DIAGNOSIS — D090 Carcinoma in situ of bladder: Secondary | ICD-10-CM | POA: Insufficient documentation

## 2019-05-28 NOTE — Patient Instructions (Signed)

## 2019-05-31 ENCOUNTER — Ambulatory Visit: Payer: PPO | Admitting: Urology

## 2019-05-31 DIAGNOSIS — I10 Essential (primary) hypertension: Secondary | ICD-10-CM | POA: Diagnosis not present

## 2019-05-31 DIAGNOSIS — E782 Mixed hyperlipidemia: Secondary | ICD-10-CM | POA: Diagnosis not present

## 2019-05-31 DIAGNOSIS — C679 Malignant neoplasm of bladder, unspecified: Secondary | ICD-10-CM | POA: Diagnosis not present

## 2019-05-31 DIAGNOSIS — I2510 Atherosclerotic heart disease of native coronary artery without angina pectoris: Secondary | ICD-10-CM | POA: Diagnosis not present

## 2019-05-31 DIAGNOSIS — E039 Hypothyroidism, unspecified: Secondary | ICD-10-CM | POA: Diagnosis not present

## 2019-05-31 DIAGNOSIS — D649 Anemia, unspecified: Secondary | ICD-10-CM | POA: Diagnosis not present

## 2019-05-31 DIAGNOSIS — Z79899 Other long term (current) drug therapy: Secondary | ICD-10-CM | POA: Diagnosis not present

## 2019-06-02 DIAGNOSIS — I10 Essential (primary) hypertension: Secondary | ICD-10-CM | POA: Diagnosis not present

## 2019-06-07 ENCOUNTER — Ambulatory Visit: Payer: PPO | Admitting: Urology

## 2019-06-08 DIAGNOSIS — H401121 Primary open-angle glaucoma, left eye, mild stage: Secondary | ICD-10-CM | POA: Diagnosis not present

## 2019-06-14 ENCOUNTER — Ambulatory Visit: Payer: PPO | Admitting: Urology

## 2019-06-21 ENCOUNTER — Ambulatory Visit: Payer: PPO | Admitting: Urology

## 2019-06-28 ENCOUNTER — Other Ambulatory Visit: Payer: Self-pay | Admitting: Family Medicine

## 2019-06-28 ENCOUNTER — Other Ambulatory Visit
Admission: RE | Admit: 2019-06-28 | Discharge: 2019-06-28 | Disposition: A | Discharge status: Home / Self Care | Payer: PPO | Source: Ambulatory Visit | Attending: Urology | Admitting: Urology

## 2019-06-28 ENCOUNTER — Other Ambulatory Visit: Payer: Self-pay

## 2019-06-28 ENCOUNTER — Ambulatory Visit: Payer: PPO | Admitting: Urology

## 2019-06-28 ENCOUNTER — Encounter: Payer: Self-pay | Admitting: Urology

## 2019-06-28 DIAGNOSIS — D090 Carcinoma in situ of bladder: Secondary | ICD-10-CM | POA: Diagnosis not present

## 2019-06-28 DIAGNOSIS — C689 Malignant neoplasm of urinary organ, unspecified: Secondary | ICD-10-CM | POA: Diagnosis not present

## 2019-06-28 LAB — URINALYSIS, COMPLETE (UACMP) WITH MICROSCOPIC
Bilirubin Urine: NEGATIVE
Glucose, UA: NEGATIVE mg/dL
Ketones, ur: NEGATIVE mg/dL
Nitrite: NEGATIVE
Protein, ur: 100 mg/dL — AB
Specific Gravity, Urine: 1.025 (ref 1.005–1.030)
pH: 5 (ref 5.0–8.0)

## 2019-06-28 MED ORDER — BCG LIVE 50 MG IS SUSR
3.2400 mL | Freq: Once | INTRAVESICAL | Status: AC
  Administered 2019-06-28: 81 mg via INTRAVESICAL

## 2019-06-28 NOTE — Progress Notes (Signed)
BCG Bladder Instillation  BCG # 1  Due to Bladder Cancer patient is present today for a BCG treatment. Patient was cleaned and prepped in a sterile fashion with betadine. A 14 FR catheter was inserted, urine return was noted 10 ml, urine was yellow in color.  72ml of reconstituted BCG was instilled into the bladder. The catheter was then removed. Patient tolerated well, no complications were noted  Performed by: Zara Council, PA-C and Elberta Leatherwood, CMA  Follow up/ Additional notes: One week for # 2 BCG

## 2019-07-02 ENCOUNTER — Other Ambulatory Visit: Payer: Self-pay

## 2019-07-02 DIAGNOSIS — D090 Carcinoma in situ of bladder: Secondary | ICD-10-CM

## 2019-07-05 ENCOUNTER — Other Ambulatory Visit: Payer: Self-pay

## 2019-07-05 ENCOUNTER — Ambulatory Visit: Payer: PPO | Admitting: Urology

## 2019-07-05 ENCOUNTER — Other Ambulatory Visit
Admission: RE | Admit: 2019-07-05 | Discharge: 2019-07-05 | Disposition: A | Discharge status: Home / Self Care | Payer: PPO | Attending: Urology | Admitting: Urology

## 2019-07-05 DIAGNOSIS — D090 Carcinoma in situ of bladder: Secondary | ICD-10-CM

## 2019-07-05 LAB — URINALYSIS, COMPLETE (UACMP) WITH MICROSCOPIC
Bilirubin Urine: NEGATIVE
Glucose, UA: NEGATIVE mg/dL
Hgb urine dipstick: NEGATIVE
Ketones, ur: NEGATIVE mg/dL
Nitrite: NEGATIVE
RBC / HPF: NONE SEEN RBC/hpf (ref 0–5)
Specific Gravity, Urine: 1.01 (ref 1.005–1.030)
pH: 5.5 (ref 5.0–8.0)

## 2019-07-05 MED ORDER — BCG LIVE 50 MG IS SUSR
3.2400 mL | Freq: Once | INTRAVESICAL | Status: AC
  Administered 2019-07-05: 81 mg via INTRAVESICAL

## 2019-07-05 NOTE — Progress Notes (Signed)
BCG Bladder Instillation  BCG # 2  Due to Bladder Cancer patient is present today for a BCG treatment. Patient was cleaned and prepped in a sterile fashion with betadine. A 14 FR catheter was inserted, urine return was noted 10 ml, urine was yellow clear in color.  50ml of reconstituted BCG was instilled into the bladder. The catheter was then removed. Patient tolerated well, no complications were noted  Performed by: Zara Council, PA-C and Elberta Leatherwood, CMA  Follow up/ Additional notes: One week BCG # 3

## 2019-07-12 ENCOUNTER — Ambulatory Visit: Payer: PPO | Admitting: Urology

## 2019-07-13 ENCOUNTER — Ambulatory Visit: Payer: PPO | Admitting: Urology

## 2019-07-13 ENCOUNTER — Other Ambulatory Visit: Payer: Self-pay

## 2019-07-13 DIAGNOSIS — D090 Carcinoma in situ of bladder: Secondary | ICD-10-CM | POA: Diagnosis not present

## 2019-07-13 LAB — URINALYSIS, COMPLETE
Bilirubin, UA: NEGATIVE
Glucose, UA: NEGATIVE
Ketones, UA: NEGATIVE
Nitrite, UA: NEGATIVE
Specific Gravity, UA: 1.01 (ref 1.005–1.030)
Urobilinogen, Ur: 0.2 mg/dL (ref 0.2–1.0)
pH, UA: 5 (ref 5.0–7.5)

## 2019-07-13 LAB — MICROSCOPIC EXAMINATION: Bacteria, UA: NONE SEEN

## 2019-07-13 MED ORDER — BCG LIVE 50 MG IS SUSR
3.2400 mL | Freq: Once | INTRAVESICAL | Status: AC
  Administered 2019-07-13: 81 mg via INTRAVESICAL

## 2019-07-13 NOTE — Progress Notes (Signed)
BCG Bladder Instillation  BCG # 3  Due to Bladder Cancer patient is present today for a BCG treatment. Patient was cleaned and prepped in a sterile fashion with betadine. A 14 FR catheter was inserted, urine return was noted 50 ml, urine was yellow in color.  28ml of reconstituted BCG was instilled into the bladder. The catheter was then removed. Patient tolerated well, no complications were noted  Preformed by: Zara Council, PA-C and Elberta Leatherwood, CMA  Follow up/ Additional notes: One week for BCG # 4

## 2019-07-19 ENCOUNTER — Ambulatory Visit: Payer: PPO | Admitting: Urology

## 2019-07-19 ENCOUNTER — Other Ambulatory Visit
Admission: RE | Admit: 2019-07-19 | Discharge: 2019-07-19 | Disposition: A | Discharge status: Home / Self Care | Payer: PPO | Attending: Urology | Admitting: Urology

## 2019-07-19 ENCOUNTER — Other Ambulatory Visit: Payer: Self-pay

## 2019-07-19 ENCOUNTER — Encounter: Payer: Self-pay | Admitting: Urology

## 2019-07-19 VITALS — BP 153/68 | HR 50 | Ht 66.0 in | Wt 159.0 lb

## 2019-07-19 DIAGNOSIS — D090 Carcinoma in situ of bladder: Secondary | ICD-10-CM | POA: Diagnosis not present

## 2019-07-19 LAB — URINALYSIS, COMPLETE (UACMP) WITH MICROSCOPIC
Bacteria, UA: NONE SEEN
Bilirubin Urine: NEGATIVE
Glucose, UA: NEGATIVE mg/dL
Ketones, ur: NEGATIVE mg/dL
Nitrite: NEGATIVE
Specific Gravity, Urine: 1.015 (ref 1.005–1.030)
pH: 5.5 (ref 5.0–8.0)

## 2019-07-19 MED ORDER — BCG LIVE 50 MG IS SUSR
3.2400 mL | Freq: Once | INTRAVESICAL | Status: AC
  Administered 2019-07-19: 81 mg via INTRAVESICAL

## 2019-07-19 NOTE — Progress Notes (Signed)
BCG Bladder Instillation  BCG # 4  Due to Bladder Cancer patient is present today for a BCG treatment. Patient was cleaned and prepped in a sterile fashion with betadine. A 14 FR catheter was inserted, urine return was noted 20 ml, urine was yellow in color.  33ml of reconstituted BCG was instilled into the bladder. The catheter was then removed. Patient tolerated well, no complications were noted  Performed by: Fonnie Jarvis, CMA and Edwin Dada, CMA  Follow up/ Additional notes: One week for BCG #5

## 2019-07-30 ENCOUNTER — Ambulatory Visit: Payer: PPO | Admitting: Physician Assistant

## 2019-07-30 ENCOUNTER — Other Ambulatory Visit: Payer: Self-pay

## 2019-07-30 DIAGNOSIS — D090 Carcinoma in situ of bladder: Secondary | ICD-10-CM

## 2019-07-30 LAB — URINALYSIS, COMPLETE
Bilirubin, UA: NEGATIVE
Glucose, UA: NEGATIVE
Ketones, UA: NEGATIVE
Leukocytes,UA: NEGATIVE
Nitrite, UA: NEGATIVE
RBC, UA: NEGATIVE
Specific Gravity, UA: 1.015 (ref 1.005–1.030)
Urobilinogen, Ur: 0.2 mg/dL (ref 0.2–1.0)
pH, UA: 5.5 (ref 5.0–7.5)

## 2019-07-30 LAB — MICROSCOPIC EXAMINATION

## 2019-07-30 MED ORDER — BCG LIVE 50 MG IS SUSR
3.2400 mL | Freq: Once | INTRAVESICAL | Status: AC
  Administered 2019-07-30: 81 mg via INTRAVESICAL

## 2019-07-30 NOTE — Progress Notes (Signed)
BCG Bladder Instillation  BCG # 5 of 6  Due to Bladder Cancer patient is present today for a BCG treatment. Patient was cleaned and prepped in a sterile fashion with betadine. A 14FR coude catheter was inserted, urine return was noted 42ml, urine was yellow in color.  59ml of reconstituted BCG was instilled into the bladder. The catheter was then removed. Patient tolerated well, no complications were noted  Performed by: Debroah Loop, PA-C and Fonnie Jarvis, CMA  Follow up/ Additional notes: 1 week for BCG #6 of 6

## 2019-08-06 ENCOUNTER — Ambulatory Visit: Payer: PPO | Admitting: Physician Assistant

## 2019-08-06 ENCOUNTER — Other Ambulatory Visit: Payer: Self-pay

## 2019-08-06 DIAGNOSIS — D494 Neoplasm of unspecified behavior of bladder: Secondary | ICD-10-CM | POA: Diagnosis not present

## 2019-08-06 LAB — URINALYSIS, COMPLETE
Bilirubin, UA: NEGATIVE
Glucose, UA: NEGATIVE
Ketones, UA: NEGATIVE
Leukocytes,UA: NEGATIVE
Nitrite, UA: NEGATIVE
Specific Gravity, UA: 1.015 (ref 1.005–1.030)
Urobilinogen, Ur: 0.2 mg/dL (ref 0.2–1.0)
pH, UA: 5.5 (ref 5.0–7.5)

## 2019-08-06 LAB — MICROSCOPIC EXAMINATION: Bacteria, UA: NONE SEEN

## 2019-08-06 MED ORDER — BCG LIVE 50 MG IS SUSR
3.2400 mL | Freq: Once | INTRAVESICAL | Status: AC
  Administered 2019-08-06: 81 mg via INTRAVESICAL

## 2019-08-06 NOTE — Progress Notes (Signed)
BCG Bladder Instillation  BCG # 6 of 6  Due to Bladder Cancer patient is present today for a BCG treatment. Patient was cleaned and prepped in a sterile fashion with betadine. A 14FR coude catheter was inserted, urine return was noted 44ml, urine was yellow in color.  52ml of reconstituted BCG was instilled into the bladder. The catheter was then removed. Patient tolerated well, no complications were noted  Performed by: Debroah Loop, PA-C and Kerman Passey, RMA  Follow up/ Additional notes: 3 months for cysto as below. Future Appointments  Date Time Provider [place]  11/10/2019  9:00 AM Stoioff, Ronda Fairly, MD [facility] None

## 2019-08-10 DIAGNOSIS — I7389 Other specified peripheral vascular diseases: Secondary | ICD-10-CM | POA: Diagnosis not present

## 2019-08-10 DIAGNOSIS — E782 Mixed hyperlipidemia: Secondary | ICD-10-CM | POA: Diagnosis not present

## 2019-08-10 DIAGNOSIS — I25810 Atherosclerosis of coronary artery bypass graft(s) without angina pectoris: Secondary | ICD-10-CM | POA: Diagnosis not present

## 2019-08-10 DIAGNOSIS — I340 Nonrheumatic mitral (valve) insufficiency: Secondary | ICD-10-CM | POA: Diagnosis not present

## 2019-08-10 DIAGNOSIS — I10 Essential (primary) hypertension: Secondary | ICD-10-CM | POA: Diagnosis not present

## 2019-08-10 DIAGNOSIS — I2510 Atherosclerotic heart disease of native coronary artery without angina pectoris: Secondary | ICD-10-CM | POA: Diagnosis not present

## 2019-09-07 DIAGNOSIS — H401121 Primary open-angle glaucoma, left eye, mild stage: Secondary | ICD-10-CM | POA: Diagnosis not present

## 2019-09-10 DIAGNOSIS — Z0000 Encounter for general adult medical examination without abnormal findings: Secondary | ICD-10-CM | POA: Diagnosis not present

## 2019-09-10 DIAGNOSIS — E039 Hypothyroidism, unspecified: Secondary | ICD-10-CM | POA: Diagnosis not present

## 2019-09-10 DIAGNOSIS — I10 Essential (primary) hypertension: Secondary | ICD-10-CM | POA: Diagnosis not present

## 2019-09-10 DIAGNOSIS — R0602 Shortness of breath: Secondary | ICD-10-CM | POA: Diagnosis not present

## 2019-09-10 DIAGNOSIS — E782 Mixed hyperlipidemia: Secondary | ICD-10-CM | POA: Diagnosis not present

## 2019-09-10 DIAGNOSIS — I2510 Atherosclerotic heart disease of native coronary artery without angina pectoris: Secondary | ICD-10-CM | POA: Diagnosis not present

## 2019-09-10 DIAGNOSIS — Z79899 Other long term (current) drug therapy: Secondary | ICD-10-CM | POA: Diagnosis not present

## 2019-09-10 DIAGNOSIS — Z125 Encounter for screening for malignant neoplasm of prostate: Secondary | ICD-10-CM | POA: Diagnosis not present

## 2019-09-13 DIAGNOSIS — R7989 Other specified abnormal findings of blood chemistry: Secondary | ICD-10-CM | POA: Diagnosis not present

## 2019-09-28 ENCOUNTER — Other Ambulatory Visit: Payer: Self-pay | Admitting: Nephrology

## 2019-09-28 DIAGNOSIS — I10 Essential (primary) hypertension: Secondary | ICD-10-CM | POA: Diagnosis not present

## 2019-09-28 DIAGNOSIS — N1832 Chronic kidney disease, stage 3b: Secondary | ICD-10-CM | POA: Diagnosis not present

## 2019-09-28 DIAGNOSIS — E871 Hypo-osmolality and hyponatremia: Secondary | ICD-10-CM | POA: Diagnosis not present

## 2019-10-05 ENCOUNTER — Ambulatory Visit
Admission: RE | Admit: 2019-10-05 | Discharge: 2019-10-05 | Disposition: A | Discharge status: Home / Self Care | Payer: PPO | Source: Ambulatory Visit | Attending: Nephrology | Admitting: Nephrology

## 2019-10-05 ENCOUNTER — Other Ambulatory Visit: Payer: Self-pay

## 2019-10-05 DIAGNOSIS — N1832 Chronic kidney disease, stage 3b: Secondary | ICD-10-CM | POA: Diagnosis not present

## 2019-10-11 DIAGNOSIS — R609 Edema, unspecified: Secondary | ICD-10-CM | POA: Diagnosis not present

## 2019-10-11 DIAGNOSIS — E782 Mixed hyperlipidemia: Secondary | ICD-10-CM | POA: Diagnosis not present

## 2019-10-11 DIAGNOSIS — E871 Hypo-osmolality and hyponatremia: Secondary | ICD-10-CM | POA: Diagnosis not present

## 2019-10-11 DIAGNOSIS — I2510 Atherosclerotic heart disease of native coronary artery without angina pectoris: Secondary | ICD-10-CM | POA: Diagnosis not present

## 2019-10-11 DIAGNOSIS — I10 Essential (primary) hypertension: Secondary | ICD-10-CM | POA: Diagnosis not present

## 2019-11-10 ENCOUNTER — Ambulatory Visit: Payer: PPO | Admitting: Urology

## 2019-11-10 ENCOUNTER — Encounter: Payer: Self-pay | Admitting: Urology

## 2019-11-10 ENCOUNTER — Other Ambulatory Visit: Payer: Self-pay

## 2019-11-10 VITALS — BP 134/52 | HR 56 | Ht 66.0 in | Wt 162.0 lb

## 2019-11-10 DIAGNOSIS — E871 Hypo-osmolality and hyponatremia: Secondary | ICD-10-CM | POA: Diagnosis not present

## 2019-11-10 DIAGNOSIS — N1832 Chronic kidney disease, stage 3b: Secondary | ICD-10-CM | POA: Diagnosis not present

## 2019-11-10 DIAGNOSIS — I10 Essential (primary) hypertension: Secondary | ICD-10-CM | POA: Diagnosis not present

## 2019-11-10 DIAGNOSIS — D631 Anemia in chronic kidney disease: Secondary | ICD-10-CM | POA: Diagnosis not present

## 2019-11-10 DIAGNOSIS — R809 Proteinuria, unspecified: Secondary | ICD-10-CM | POA: Diagnosis not present

## 2019-11-10 DIAGNOSIS — D494 Neoplasm of unspecified behavior of bladder: Secondary | ICD-10-CM

## 2019-11-10 NOTE — Progress Notes (Signed)
° °  11/10/19  CC:  Chief Complaint  Patient presents with   Cysto    Urologic history: 1. Ta urothelial carcinoma bladder -TURBT 05/09/2019; path low-grade urothelial carcinoma with separate focus carcinoma in situ -Completed 6-week course induction BCG 08/06/2019  HPI: 79 y.o. male presents for surveillance cystoscopy.  He has no complaints.  Tolerating BCG without problems  Blood pressure (!) 134/52, pulse (!) 56, height 5\' 6"  (1.676 m), weight 162 lb (73.5 kg). NED. A&Ox3.   No respiratory distress   Abd soft, NT, ND Normal phallus with bilateral descended testicles Lungs: Clear CV: RRR  Cystoscopy Procedure Note  Patient identification was confirmed, informed consent was obtained, and patient was prepped using Betadine solution.  Lidocaine jelly was administered per urethral meatus.     Pre-Procedure: - Inspection reveals a normal caliber urethral meatus.  Procedure: The flexible cystoscope was introduced without difficulty - No urethral strictures/lesions are present. - Mild lateral lobe enlargement prostate  - Mild elevation bladder neck - Bilateral ureteral orifices identified - Bladder mucosa reveals papillary tumor bladder base medial to UO and anterior to trigone. - No bladder stones - No trabeculation  Retroflexion shows no significant abnormalities.   Post-Procedure: - Patient tolerated the procedure well  Assessment/ Plan:  Recurrent bladder tumor noted post BCG cystoscopy.  The findings were discussed in detail with Mr. Cockerell and recommended scheduling TURBT.  The procedure was discussed in detail including potential risks of bleeding, infection and bladder perforation.  All questions were answered and he desires to proceed.   Abbie Sons, MD

## 2019-11-11 ENCOUNTER — Other Ambulatory Visit: Payer: Self-pay | Admitting: Radiology

## 2019-11-11 DIAGNOSIS — D494 Neoplasm of unspecified behavior of bladder: Secondary | ICD-10-CM

## 2019-11-11 LAB — MICROSCOPIC EXAMINATION: Bacteria, UA: NONE SEEN

## 2019-11-11 LAB — URINALYSIS, COMPLETE
Bilirubin, UA: NEGATIVE
Glucose, UA: NEGATIVE
Ketones, UA: NEGATIVE
Nitrite, UA: NEGATIVE
RBC, UA: NEGATIVE
Specific Gravity, UA: 1.01 (ref 1.005–1.030)
Urobilinogen, Ur: 0.2 mg/dL (ref 0.2–1.0)
pH, UA: 5.5 (ref 5.0–7.5)

## 2019-11-12 ENCOUNTER — Telehealth: Payer: Self-pay | Admitting: Radiology

## 2019-11-12 ENCOUNTER — Other Ambulatory Visit: Payer: Self-pay | Admitting: Radiology

## 2019-11-12 NOTE — Telephone Encounter (Signed)
LMOM for patient to hold Plavix beginning 11/16/2019 and continue aspirin 81mg  except for the day of surgery per Dr Bernardo Heater.

## 2019-11-15 ENCOUNTER — Other Ambulatory Visit: Payer: Self-pay | Admitting: Urology

## 2019-11-15 MED ORDER — DOXYCYCLINE HYCLATE 100 MG PO CAPS: 100.0000 mg | ORAL_CAPSULE | Freq: Two times a day (BID) | ORAL | 0 refills | Status: AC

## 2019-11-16 ENCOUNTER — Other Ambulatory Visit: Payer: Self-pay | Admitting: Radiology

## 2019-11-16 ENCOUNTER — Telehealth: Payer: Self-pay | Admitting: Radiology

## 2019-11-16 DIAGNOSIS — J189 Pneumonia, unspecified organism: Secondary | ICD-10-CM | POA: Diagnosis not present

## 2019-11-16 DIAGNOSIS — R079 Chest pain, unspecified: Secondary | ICD-10-CM | POA: Diagnosis not present

## 2019-11-16 DIAGNOSIS — D494 Neoplasm of unspecified behavior of bladder: Secondary | ICD-10-CM

## 2019-11-16 DIAGNOSIS — R0789 Other chest pain: Secondary | ICD-10-CM | POA: Diagnosis not present

## 2019-11-16 NOTE — Telephone Encounter (Signed)
pre op antibiotic changed to Cipro 400 mg IV. Wife notified of script sent to pharmacy.

## 2019-11-16 NOTE — Telephone Encounter (Signed)
-----   Message from Abbie Sons, MD sent at 11/15/2019  9:13 AM EDT ----- Preop urine culture grew a low level of bacteria.  Antibiotic Rx was sent to pharmacy and please change preoperative antibiotic to Cipro 400 mg.  Thanks

## 2019-11-17 LAB — CULTURE, URINE COMPREHENSIVE

## 2019-11-18 ENCOUNTER — Other Ambulatory Visit: Admission: RE | Admit: 2019-11-18 | Payer: PPO | Source: Ambulatory Visit

## 2019-11-19 ENCOUNTER — Other Ambulatory Visit: Payer: PPO

## 2019-11-19 DIAGNOSIS — Z20822 Contact with and (suspected) exposure to covid-19: Secondary | ICD-10-CM | POA: Diagnosis not present

## 2019-11-23 ENCOUNTER — Inpatient Hospital Stay: Payer: PPO

## 2019-11-23 ENCOUNTER — Inpatient Hospital Stay
Admission: EM | Admit: 2019-11-23 | Discharge: 2019-12-03 | DRG: 871 | Disposition: E | Payer: PPO | Attending: Internal Medicine | Admitting: Internal Medicine

## 2019-11-23 ENCOUNTER — Emergency Department: Payer: PPO

## 2019-11-23 ENCOUNTER — Encounter: Payer: Self-pay | Admitting: Emergency Medicine

## 2019-11-23 ENCOUNTER — Other Ambulatory Visit: Payer: Self-pay

## 2019-11-23 DIAGNOSIS — E785 Hyperlipidemia, unspecified: Secondary | ICD-10-CM | POA: Diagnosis not present

## 2019-11-23 DIAGNOSIS — E872 Acidosis: Secondary | ICD-10-CM | POA: Diagnosis not present

## 2019-11-23 DIAGNOSIS — N179 Acute kidney failure, unspecified: Secondary | ICD-10-CM | POA: Diagnosis present

## 2019-11-23 DIAGNOSIS — I469 Cardiac arrest, cause unspecified: Secondary | ICD-10-CM | POA: Diagnosis present

## 2019-11-23 DIAGNOSIS — Z808 Family history of malignant neoplasm of other organs or systems: Secondary | ICD-10-CM

## 2019-11-23 DIAGNOSIS — A419 Sepsis, unspecified organism: Principal | ICD-10-CM | POA: Diagnosis present

## 2019-11-23 DIAGNOSIS — I959 Hypotension, unspecified: Secondary | ICD-10-CM | POA: Diagnosis not present

## 2019-11-23 DIAGNOSIS — Z79899 Other long term (current) drug therapy: Secondary | ICD-10-CM

## 2019-11-23 DIAGNOSIS — I129 Hypertensive chronic kidney disease with stage 1 through stage 4 chronic kidney disease, or unspecified chronic kidney disease: Secondary | ICD-10-CM | POA: Diagnosis present

## 2019-11-23 DIAGNOSIS — Z515 Encounter for palliative care: Secondary | ICD-10-CM | POA: Diagnosis not present

## 2019-11-23 DIAGNOSIS — R4182 Altered mental status, unspecified: Secondary | ICD-10-CM | POA: Diagnosis not present

## 2019-11-23 DIAGNOSIS — Z79890 Hormone replacement therapy (postmenopausal): Secondary | ICD-10-CM

## 2019-11-23 DIAGNOSIS — H5704 Mydriasis: Secondary | ICD-10-CM | POA: Diagnosis present

## 2019-11-23 DIAGNOSIS — Z20822 Contact with and (suspected) exposure to covid-19: Secondary | ICD-10-CM | POA: Diagnosis present

## 2019-11-23 DIAGNOSIS — R000 Tachycardia, unspecified: Secondary | ICD-10-CM | POA: Diagnosis not present

## 2019-11-23 DIAGNOSIS — R918 Other nonspecific abnormal finding of lung field: Secondary | ICD-10-CM | POA: Diagnosis not present

## 2019-11-23 DIAGNOSIS — R6521 Severe sepsis with septic shock: Secondary | ICD-10-CM | POA: Diagnosis not present

## 2019-11-23 DIAGNOSIS — R230 Cyanosis: Secondary | ICD-10-CM | POA: Diagnosis present

## 2019-11-23 DIAGNOSIS — E039 Hypothyroidism, unspecified: Secondary | ICD-10-CM | POA: Diagnosis present

## 2019-11-23 DIAGNOSIS — R0689 Other abnormalities of breathing: Secondary | ICD-10-CM | POA: Diagnosis not present

## 2019-11-23 DIAGNOSIS — R7401 Elevation of levels of liver transaminase levels: Secondary | ICD-10-CM | POA: Diagnosis present

## 2019-11-23 DIAGNOSIS — Z833 Family history of diabetes mellitus: Secondary | ICD-10-CM

## 2019-11-23 DIAGNOSIS — R7989 Other specified abnormal findings of blood chemistry: Secondary | ICD-10-CM

## 2019-11-23 DIAGNOSIS — D631 Anemia in chronic kidney disease: Secondary | ICD-10-CM | POA: Diagnosis present

## 2019-11-23 DIAGNOSIS — R6520 Severe sepsis without septic shock: Secondary | ICD-10-CM | POA: Diagnosis not present

## 2019-11-23 DIAGNOSIS — D509 Iron deficiency anemia, unspecified: Secondary | ICD-10-CM | POA: Diagnosis present

## 2019-11-23 DIAGNOSIS — Z8619 Personal history of other infectious and parasitic diseases: Secondary | ICD-10-CM

## 2019-11-23 DIAGNOSIS — Z8249 Family history of ischemic heart disease and other diseases of the circulatory system: Secondary | ICD-10-CM | POA: Diagnosis not present

## 2019-11-23 DIAGNOSIS — I739 Peripheral vascular disease, unspecified: Secondary | ICD-10-CM | POA: Diagnosis not present

## 2019-11-23 DIAGNOSIS — J90 Pleural effusion, not elsewhere classified: Secondary | ICD-10-CM | POA: Diagnosis not present

## 2019-11-23 DIAGNOSIS — H409 Unspecified glaucoma: Secondary | ICD-10-CM | POA: Diagnosis present

## 2019-11-23 DIAGNOSIS — I2510 Atherosclerotic heart disease of native coronary artery without angina pectoris: Secondary | ICD-10-CM | POA: Diagnosis not present

## 2019-11-23 DIAGNOSIS — E871 Hypo-osmolality and hyponatremia: Secondary | ICD-10-CM | POA: Diagnosis not present

## 2019-11-23 DIAGNOSIS — T502X5A Adverse effect of carbonic-anhydrase inhibitors, benzothiadiazides and other diuretics, initial encounter: Secondary | ICD-10-CM | POA: Diagnosis present

## 2019-11-23 DIAGNOSIS — E875 Hyperkalemia: Secondary | ICD-10-CM | POA: Diagnosis not present

## 2019-11-23 DIAGNOSIS — J440 Chronic obstructive pulmonary disease with acute lower respiratory infection: Secondary | ICD-10-CM | POA: Diagnosis present

## 2019-11-23 DIAGNOSIS — Z7982 Long term (current) use of aspirin: Secondary | ICD-10-CM

## 2019-11-23 DIAGNOSIS — Z9049 Acquired absence of other specified parts of digestive tract: Secondary | ICD-10-CM | POA: Diagnosis not present

## 2019-11-23 DIAGNOSIS — J189 Pneumonia, unspecified organism: Secondary | ICD-10-CM | POA: Diagnosis present

## 2019-11-23 DIAGNOSIS — J9601 Acute respiratory failure with hypoxia: Secondary | ICD-10-CM | POA: Diagnosis not present

## 2019-11-23 DIAGNOSIS — D494 Neoplasm of unspecified behavior of bladder: Secondary | ICD-10-CM | POA: Diagnosis present

## 2019-11-23 DIAGNOSIS — N1832 Chronic kidney disease, stage 3b: Secondary | ICD-10-CM | POA: Diagnosis not present

## 2019-11-23 DIAGNOSIS — Z7902 Long term (current) use of antithrombotics/antiplatelets: Secondary | ICD-10-CM

## 2019-11-23 DIAGNOSIS — Z87891 Personal history of nicotine dependence: Secondary | ICD-10-CM | POA: Diagnosis not present

## 2019-11-23 DIAGNOSIS — R001 Bradycardia, unspecified: Secondary | ICD-10-CM | POA: Diagnosis present

## 2019-11-23 DIAGNOSIS — R531 Weakness: Secondary | ICD-10-CM | POA: Diagnosis present

## 2019-11-23 DIAGNOSIS — R748 Abnormal levels of other serum enzymes: Secondary | ICD-10-CM

## 2019-11-23 DIAGNOSIS — Z825 Family history of asthma and other chronic lower respiratory diseases: Secondary | ICD-10-CM

## 2019-11-23 DIAGNOSIS — Z4682 Encounter for fitting and adjustment of non-vascular catheter: Secondary | ICD-10-CM | POA: Diagnosis not present

## 2019-11-23 DIAGNOSIS — I25810 Atherosclerosis of coronary artery bypass graft(s) without angina pectoris: Secondary | ICD-10-CM | POA: Diagnosis not present

## 2019-11-23 DIAGNOSIS — Z9861 Coronary angioplasty status: Secondary | ICD-10-CM

## 2019-11-23 DIAGNOSIS — Z951 Presence of aortocoronary bypass graft: Secondary | ICD-10-CM

## 2019-11-23 DIAGNOSIS — R069 Unspecified abnormalities of breathing: Secondary | ICD-10-CM | POA: Diagnosis not present

## 2019-11-23 DIAGNOSIS — R4781 Slurred speech: Secondary | ICD-10-CM | POA: Diagnosis present

## 2019-11-23 LAB — BLOOD GAS, ARTERIAL
Acid-base deficit: 22.7 mmol/L — ABNORMAL HIGH (ref 0.0–2.0)
Bicarbonate: 9.2 mmol/L — ABNORMAL LOW (ref 20.0–28.0)
FIO2: 100
MECHVT: 450 mL
O2 Saturation: 55.5 %
PEEP: 8 cmH2O
Patient temperature: 37
RATE: 20 resp/min
pCO2 arterial: 46 mmHg (ref 32.0–48.0)
pH, Arterial: 6.91 — CL (ref 7.350–7.450)
pO2, Arterial: 51 mmHg — ABNORMAL LOW (ref 83.0–108.0)

## 2019-11-23 LAB — CBC WITH DIFFERENTIAL/PLATELET
Abs Immature Granulocytes: 1.08 10*3/uL — ABNORMAL HIGH (ref 0.00–0.07)
Basophils Absolute: 0 10*3/uL (ref 0.0–0.1)
Basophils Relative: 0 %
Eosinophils Absolute: 0 10*3/uL (ref 0.0–0.5)
Eosinophils Relative: 0 %
HCT: 31 % — ABNORMAL LOW (ref 39.0–52.0)
Hemoglobin: 11.4 g/dL — ABNORMAL LOW (ref 13.0–17.0)
Immature Granulocytes: 3 %
Lymphocytes Relative: 18 %
Lymphs Abs: 7.4 10*3/uL — ABNORMAL HIGH (ref 0.7–4.0)
MCH: 29.2 pg (ref 26.0–34.0)
MCHC: 36.8 g/dL — ABNORMAL HIGH (ref 30.0–36.0)
MCV: 79.5 fL — ABNORMAL LOW (ref 80.0–100.0)
Monocytes Absolute: 1.6 10*3/uL — ABNORMAL HIGH (ref 0.1–1.0)
Monocytes Relative: 4 %
Neutro Abs: 30.9 10*3/uL — ABNORMAL HIGH (ref 1.7–7.7)
Neutrophils Relative %: 75 %
Platelets: 529 10*3/uL — ABNORMAL HIGH (ref 150–400)
RBC: 3.9 MIL/uL — ABNORMAL LOW (ref 4.22–5.81)
RDW: 14.7 % (ref 11.5–15.5)
Smear Review: NORMAL
WBC: 41.1 10*3/uL — ABNORMAL HIGH (ref 4.0–10.5)
nRBC: 0 % (ref 0.0–0.2)

## 2019-11-23 LAB — URINALYSIS, ROUTINE W REFLEX MICROSCOPIC
Bilirubin Urine: NEGATIVE
Glucose, UA: NEGATIVE mg/dL
Ketones, ur: NEGATIVE mg/dL
Nitrite: NEGATIVE
Protein, ur: NEGATIVE mg/dL
Specific Gravity, Urine: 1.013 (ref 1.005–1.030)
Squamous Epithelial / HPF: NONE SEEN (ref 0–5)
pH: 5 (ref 5.0–8.0)

## 2019-11-23 LAB — BASIC METABOLIC PANEL
BUN: 130 mg/dL — ABNORMAL HIGH (ref 8–23)
CO2: <7 mmol/L — ABNORMAL LOW (ref 22–32)
Calcium: 6.7 mg/dL — ABNORMAL LOW (ref 8.9–10.3)
Chloride: 99 mmol/L (ref 98–111)
Creatinine, Ser: 5.68 mg/dL — ABNORMAL HIGH (ref 0.61–1.24)
GFR calc Af Amer: 10 mL/min — ABNORMAL LOW (ref >60)
GFR calc non Af Amer: 9 mL/min — ABNORMAL LOW (ref >60)
Glucose, Bld: 99 mg/dL (ref 70–99)
Potassium: 6.1 mmol/L — ABNORMAL HIGH (ref 3.5–5.1)
Sodium: 121 mmol/L — ABNORMAL LOW (ref 135–145)

## 2019-11-23 LAB — COMPREHENSIVE METABOLIC PANEL
ALT: 104 U/L — ABNORMAL HIGH (ref 0–44)
AST: 300 U/L — ABNORMAL HIGH (ref 15–41)
Albumin: 2.4 g/dL — ABNORMAL LOW (ref 3.5–5.0)
Alkaline Phosphatase: 55 U/L (ref 38–126)
Anion gap: 19 — ABNORMAL HIGH (ref 5–15)
BUN: 150 mg/dL — ABNORMAL HIGH (ref 8–23)
CO2: 11 mmol/L — ABNORMAL LOW (ref 22–32)
Calcium: 7.9 mg/dL — ABNORMAL LOW (ref 8.9–10.3)
Chloride: 87 mmol/L — ABNORMAL LOW (ref 98–111)
Creatinine, Ser: 6.38 mg/dL — ABNORMAL HIGH (ref 0.61–1.24)
GFR calc Af Amer: 9 mL/min — ABNORMAL LOW (ref >60)
GFR calc non Af Amer: 8 mL/min — ABNORMAL LOW (ref >60)
Glucose, Bld: 108 mg/dL — ABNORMAL HIGH (ref 70–99)
Potassium: 5.2 mmol/L — ABNORMAL HIGH (ref 3.5–5.1)
Sodium: 117 mmol/L — CL (ref 135–145)
Total Bilirubin: 1.5 mg/dL — ABNORMAL HIGH (ref 0.3–1.2)
Total Protein: 6.3 g/dL — ABNORMAL LOW (ref 6.5–8.1)

## 2019-11-23 LAB — PROTIME-INR
INR: 1.4 — ABNORMAL HIGH (ref 0.8–1.2)
Prothrombin Time: 16.4 seconds — ABNORMAL HIGH (ref 11.4–15.2)

## 2019-11-23 LAB — TSH: TSH: 4.104 u[IU]/mL (ref 0.350–4.500)

## 2019-11-23 LAB — SARS CORONAVIRUS 2 BY RT PCR (HOSPITAL ORDER, PERFORMED IN ~~LOC~~ HOSPITAL LAB): SARS Coronavirus 2: NEGATIVE

## 2019-11-23 LAB — SODIUM: Sodium: 122 mmol/L — ABNORMAL LOW (ref 135–145)

## 2019-11-23 LAB — PROCALCITONIN: Procalcitonin: <0.1 ng/mL

## 2019-11-23 LAB — GLUCOSE, CAPILLARY: Glucose-Capillary: 98 mg/dL (ref 70–99)

## 2019-11-23 LAB — LACTIC ACID, PLASMA
Lactic Acid, Venous: 1.4 mmol/L (ref 0.5–1.9)
Lactic Acid, Venous: 1.8 mmol/L (ref 0.5–1.9)

## 2019-11-23 MED ORDER — ONDANSETRON HCL 4 MG PO TABS: 4.0000 mg | ORAL_TABLET | Freq: Four times a day (QID) | ORAL | Status: DC | PRN

## 2019-11-23 MED ORDER — EPINEPHRINE HCL 5 MG/250ML IV SOLN IN NS
0.5000 ug/min | INTRAVENOUS | Status: DC
  Administered 2019-11-23: 20 ug/min via INTRAVENOUS
  Filled 2019-11-23: qty 250

## 2019-11-23 MED ORDER — INSULIN REGULAR HUMAN 100 UNIT/ML IJ SOLN
10.0000 [IU] | Freq: Once | INTRAMUSCULAR | Status: DC
  Filled 2019-11-23: qty 3

## 2019-11-23 MED ORDER — FLUTICASONE-UMECLIDIN-VILANT 100-62.5-25 MCG/INH IN AEPB: 1.0000 | INHALATION_SPRAY | Freq: Every day | RESPIRATORY_TRACT | Status: DC

## 2019-11-23 MED ORDER — SODIUM CHLORIDE 0.9 % IV SOLN: INTRAVENOUS | Status: DC

## 2019-11-23 MED ORDER — LACTATED RINGERS IV BOLUS
1000.0000 mL | Freq: Once | INTRAVENOUS | Status: AC
  Administered 2019-11-23: 1000 mL via INTRAVENOUS

## 2019-11-23 MED ORDER — NOREPINEPHRINE 4 MG/250ML-% IV SOLN
0.0000 ug/min | INTRAVENOUS | Status: DC
  Administered 2019-11-23: 10 ug/min via INTRAVENOUS
  Filled 2019-11-23: qty 250

## 2019-11-23 MED ORDER — ATORVASTATIN CALCIUM 20 MG PO TABS: 80.0000 mg | ORAL_TABLET | Freq: Every evening | ORAL | Status: DC

## 2019-11-23 MED ORDER — SODIUM CHLORIDE 0.9 % IV SOLN: 1.0000 g | INTRAVENOUS | Status: DC

## 2019-11-23 MED ORDER — CHLORHEXIDINE GLUCONATE CLOTH 2 % EX PADS
6.0000 | MEDICATED_PAD | Freq: Every day | CUTANEOUS | Status: DC
  Filled 2019-11-23: qty 6

## 2019-11-23 MED ORDER — SODIUM CHLORIDE 0.9 % IV BOLUS (SEPSIS)
1000.0000 mL | Freq: Once | INTRAVENOUS | Status: AC
  Administered 2019-11-23: 1000 mL via INTRAVENOUS

## 2019-11-23 MED ORDER — ETOMIDATE 2 MG/ML IV SOLN
15.0000 mg | Freq: Once | INTRAVENOUS | Status: AC
  Administered 2019-11-23: 15 mg via INTRAVENOUS

## 2019-11-23 MED ORDER — VANCOMYCIN HCL 1500 MG/300ML IV SOLN
1500.0000 mg | Freq: Once | INTRAVENOUS | Status: DC
  Filled 2019-11-23: qty 300

## 2019-11-23 MED ORDER — ONDANSETRON HCL 4 MG/2ML IJ SOLN: 4.0000 mg | Freq: Four times a day (QID) | INTRAMUSCULAR | Status: DC | PRN

## 2019-11-23 MED ORDER — RANOLAZINE ER 500 MG PO TB12: 500.0000 mg | ORAL_TABLET | Freq: Two times a day (BID) | ORAL | Status: DC

## 2019-11-23 MED ORDER — ALBUTEROL SULFATE (2.5 MG/3ML) 0.083% IN NEBU: 2.5000 mg | INHALATION_SOLUTION | Freq: Once | RESPIRATORY_TRACT | Status: DC

## 2019-11-23 MED ORDER — LATANOPROST 0.005 % OP SOLN
1.0000 [drp] | Freq: Every day | OPHTHALMIC | Status: DC
  Filled 2019-11-23: qty 2.5

## 2019-11-23 MED ORDER — SODIUM BICARBONATE 8.4 % IV SOLN
INTRAVENOUS | Status: DC
  Filled 2019-11-23 (×2): qty 850

## 2019-11-23 MED ORDER — ALBUTEROL SULFATE (2.5 MG/3ML) 0.083% IN NEBU
INHALATION_SOLUTION | RESPIRATORY_TRACT | Status: AC
  Administered 2019-11-23: 2.5 mg
  Filled 2019-11-23: qty 3

## 2019-11-23 MED ORDER — VITAMIN B-12 1000 MCG PO TABS: 1000.0000 ug | ORAL_TABLET | Freq: Every day | ORAL | Status: DC

## 2019-11-23 MED ORDER — EPINEPHRINE 1 MG/10ML IJ SOSY
1.0000 mg | PREFILLED_SYRINGE | Freq: Once | INTRAMUSCULAR | Status: AC
  Administered 2019-11-23: 1 mg via INTRAVENOUS

## 2019-11-23 MED ORDER — HYDROCORTISONE NA SUCCINATE PF 100 MG IJ SOLR
50.0000 mg | Freq: Four times a day (QID) | INTRAMUSCULAR | Status: DC
  Administered 2019-11-23: 50 mg via INTRAVENOUS
  Filled 2019-11-23: qty 2

## 2019-11-23 MED ORDER — GLYCOPYRROLATE 0.2 MG/ML IJ SOLN: 0.4000 mg | INTRAMUSCULAR | Status: DC | PRN

## 2019-11-23 MED ORDER — DOPAMINE-DEXTROSE 3.2-5 MG/ML-% IV SOLN
0.0000 ug/kg/min | INTRAVENOUS | Status: DC
  Administered 2019-11-23: 15 ug/kg/min via INTRAVENOUS

## 2019-11-23 MED ORDER — ASPIRIN EC 81 MG PO TBEC: 81.0000 mg | DELAYED_RELEASE_TABLET | Freq: Every day | ORAL | Status: DC

## 2019-11-23 MED ORDER — HEPARIN SODIUM (PORCINE) 5000 UNIT/ML IJ SOLN
5000.0000 [IU] | Freq: Three times a day (TID) | INTRAMUSCULAR | Status: DC
  Administered 2019-11-23: 5000 [IU] via SUBCUTANEOUS
  Filled 2019-11-23 (×2): qty 1

## 2019-11-23 MED ORDER — LORAZEPAM 2 MG/ML IJ SOLN: 2.0000 mg | INTRAMUSCULAR | Status: DC | PRN

## 2019-11-23 MED ORDER — CYCLOBENZAPRINE HCL 10 MG PO TABS
10.0000 mg | ORAL_TABLET | Freq: Every day | ORAL | Status: DC | PRN
  Filled 2019-11-23: qty 1

## 2019-11-23 MED ORDER — ACETAMINOPHEN 650 MG RE SUPP: 650.0000 mg | Freq: Four times a day (QID) | RECTAL | Status: DC | PRN

## 2019-11-23 MED ORDER — LEVOTHYROXINE SODIUM 50 MCG PO TABS: 50.0000 ug | ORAL_TABLET | Freq: Every day | ORAL | Status: DC

## 2019-11-23 MED ORDER — ACETAMINOPHEN 325 MG PO TABS: 650.0000 mg | ORAL_TABLET | Freq: Four times a day (QID) | ORAL | Status: DC | PRN

## 2019-11-23 MED ORDER — MORPHINE 100MG IN NS 100ML (1MG/ML) PREMIX INFUSION: 1.0000 mg/h | INTRAVENOUS | Status: DC

## 2019-11-23 MED ORDER — SODIUM CHLORIDE 0.9 % IV SOLN
500.0000 mg | INTRAVENOUS | Status: DC
  Administered 2019-11-23: 500 mg via INTRAVENOUS
  Filled 2019-11-23: qty 500

## 2019-11-23 MED ORDER — SODIUM BICARBONATE 8.4 % IV SOLN
100.0000 meq | Freq: Once | INTRAVENOUS | Status: AC
  Administered 2019-11-23: 100 meq via INTRAVENOUS

## 2019-11-23 MED ORDER — VECURONIUM BROMIDE 10 MG IV SOLR
8.0000 mg | Freq: Once | INTRAVENOUS | Status: AC
  Administered 2019-11-23: 8 mg via INTRAVENOUS

## 2019-11-23 MED ORDER — SODIUM CHLORIDE 0.9 % IV SOLN: 2.0000 g | INTRAVENOUS | Status: DC

## 2019-11-23 MED ORDER — CALCIUM GLUCONATE-NACL 1-0.675 GM/50ML-% IV SOLN
1.0000 g | Freq: Once | INTRAVENOUS | Status: DC
  Filled 2019-11-23: qty 50

## 2019-11-23 MED ORDER — DEXTROSE 50 % IV SOLN: 1.0000 | Freq: Once | INTRAVENOUS | Status: DC

## 2019-11-23 MED ORDER — CLOPIDOGREL BISULFATE 75 MG PO TABS
75.0000 mg | ORAL_TABLET | Freq: Every day | ORAL | Status: DC
  Administered 2019-11-23: 75 mg via ORAL
  Filled 2019-11-23: qty 1

## 2019-11-23 MED ORDER — PATIROMER SORBITEX CALCIUM 8.4 G PO PACK
25.2000 g | PACK | Freq: Once | ORAL | Status: DC
  Filled 2019-11-23: qty 3

## 2019-11-23 MED ORDER — SODIUM CHLORIDE 0.9 % IV SOLN
2.0000 g | INTRAVENOUS | Status: DC
  Administered 2019-11-23: 2 g via INTRAVENOUS
  Filled 2019-11-23: qty 20

## 2019-11-23 MED ORDER — UMECLIDINIUM BROMIDE 62.5 MCG/INH IN AEPB
1.0000 | INHALATION_SPRAY | Freq: Every day | RESPIRATORY_TRACT | Status: DC
  Filled 2019-11-23: qty 7

## 2019-11-23 MED ORDER — RANOLAZINE ER 500 MG PO TB12
500.0000 mg | ORAL_TABLET | Freq: Two times a day (BID) | ORAL | Status: DC
  Filled 2019-11-23 (×2): qty 1

## 2019-11-23 MED ORDER — FLUTICASONE FUROATE-VILANTEROL 100-25 MCG/INH IN AEPB
1.0000 | INHALATION_SPRAY | Freq: Every day | RESPIRATORY_TRACT | Status: DC
  Filled 2019-11-23: qty 28

## 2019-11-23 MED ORDER — SODIUM CHLORIDE 0.9 % IV SOLN: 500.0000 mg | INTRAVENOUS | Status: DC

## 2019-11-24 LAB — URINE CULTURE: Culture: NO GROWTH

## 2019-11-24 LAB — PATHOLOGIST SMEAR REVIEW

## 2019-11-24 LAB — STREP PNEUMONIAE URINARY ANTIGEN: Strep Pneumo Urinary Antigen: NEGATIVE

## 2019-11-26 LAB — LEGIONELLA PNEUMOPHILA SEROGP 1 UR AG: L. pneumophila Serogp 1 Ur Ag: NEGATIVE

## 2019-11-28 LAB — CULTURE, BLOOD (ROUTINE X 2)
Culture: NO GROWTH
Culture: NO GROWTH
Special Requests: ADEQUATE

## 2019-12-03 ENCOUNTER — Other Ambulatory Visit: Payer: PPO

## 2019-12-03 NOTE — ED Notes (Signed)
Pt continued to brady heart rate 22.  Dr Jacqualine Code in with pt.  cpr started.  Rt in with pt.  Epi given per dr Jacqualine Code verbal order.  Pt bagged.

## 2019-12-03 NOTE — Progress Notes (Signed)
[facility]  ROUNDING NOTE   Subjective:   [name] was admitted to [facility] on 12-15-19 for Sepsis [facility]) [A41.9]  Patient was last seen in our office by Dr. Holley House on 9/8. His kidney function was creatinineof 1.67, GFR of 38 at that time.   He was recently diagnosed with pneumonia.  Patient unable to give history at this time    Objective:  Vital signs in last 24 hours:  Temp:  [92 F (33.3 C)-93.2 F (34 C)] 93.2 F (34 C) (09/21 1414) Pulse Rate:  [40-85] 40 (09/21 1515) Resp:  [16-24] 22 (09/21 1515) BP: (84-118)/(37-64) 90/39 (09/21 1500) SpO2:  [94 %-100 %] 100 % (09/21 1515) Weight:  [63.5 kg] 63.5 kg (09/21 1211)  Weight change:  Filed Weights   December 15, 2019 1211  Weight: 63.5 kg    Intake/Output: No intake/output data recorded.   Intake/Output this shift:  Total I/O In: 2100 [IV Piggyback:2100] Out: -   Physical Exam: General: Ill appearing  Head: Normocephalic, atraumatic. Moist oral mucosal membranes  Eyes: Anicteric, PERRL  Neck: Supple, trachea midline  Lungs:  Right sided rhonchi  Heart: bradycardia  Abdomen:  Soft, nontender,   Extremities: no peripheral edema.  Neurologic: Nonfocal, moving all four extremities  Skin: +diaphoretc        Basic Metabolic Panel: Recent Labs  Lab 12-15-2019 1219  NA 117*  K 5.2*  CL 87*  CO2 11*  GLUCOSE 108*  BUN 150*  CREATININE 6.38*  CALCIUM 7.9*    Liver Function Tests: Recent Labs  Lab 12-15-19 1219  AST 300*  ALT 104*  ALKPHOS 55  BILITOT 1.5*  PROT 6.3*  ALBUMIN 2.4*   No results for input(s): LIPASE, AMYLASE in the last 168 hours. No results for input(s): AMMONIA in the last 168 hours.  CBC: Recent Labs  Lab 12-15-19 1219  WBC 41.1*  NEUTROABS 30.9*  HGB 11.4*  HCT 31.0*  MCV 79.5*  PLT 529*    Cardiac Enzymes: No results for input(s): CKTOTAL, CKMB, CKMBINDEX, TROPONINI in the last 168 hours.  BNP: Invalid input(s): POCBNP  CBG: No results for  input(s): GLUCAP in the last 168 hours.  Microbiology: Results for orders placed or performed in visit on 11/10/19  Microscopic Examination     Status: Abnormal   Collection Time: 11/10/19  8:44 AM   Urine  Result Value Ref Range Status   WBC, UA 11-30 (A) 0 - 5 /hpf Final   RBC 0-2 0 - 2 /hpf Final   Epithelial Cells (non renal) 0-10 0 - 10 /hpf Final   Bacteria, UA None seen None seen/Few Final  CULTURE, URINE COMPREHENSIVE     Status: Abnormal   Collection Time: 11/10/19  9:33 AM   Specimen: Urine   UR  Result Value Ref Range Status   Urine Culture, Comprehensive Final report (A)  Final   Organism ID, Bacteria Enterococcus faecalis (A)  Final    Comment: 10,000-25,000 colony forming units per mL   Organism ID, Bacteria Comment  Final    Comment: Mixed urogenital flora 3,000 Colonies/mL    ANTIMICROBIAL SUSCEPTIBILITY Comment  Final    Comment:       ** S = Susceptible; I = Intermediate; R = Resistant **                    P = Positive; N = Negative             MICS are expressed  in micrograms per mL    Antibiotic                 RSLT#1    RSLT#2    RSLT#3    RSLT#4 Ciprofloxacin                  S Levofloxacin                   S Nitrofurantoin                 S Penicillin                     S Tetracycline                   S Vancomycin                     S     Coagulation Studies: Recent Labs    November 29, 2019 1219  LABPROT 16.4*  INR 1.4*    Urinalysis: No results for input(s): COLORURINE, LABSPEC, PHURINE, GLUCOSEU, HGBUR, BILIRUBINUR, KETONESUR, PROTEINUR, UROBILINOGEN, NITRITE, LEUKOCYTESUR in the last 72 hours.  Invalid input(s): APPERANCEUR    Imaging: CT Head Wo Contrast  Result Date: 11-29-19 CLINICAL DATA:  Altered mental status EXAM: CT HEAD WITHOUT CONTRAST TECHNIQUE: Contiguous axial images were obtained from the base of the skull through the vertex without intravenous contrast. COMPARISON:  March 25, 2017 FINDINGS: Brain: There is stable age  related volume loss. There is no intracranial mass, hemorrhage, extra-axial fluid collection, or midline shift. There is mild patchy small vessel disease in the centra semiovale bilaterally. Elsewhere, brain parenchyma appears unremarkable. No acute infarct is appreciable. Vascular: There is no hyperdense vessel. There is calcification in each carotid siphon region. Skull: Bony calvarium appears intact. Sinuses/Orbits: There is mucosal thickening in the posterior right maxillary antrum. There is mucosal thickening and opacification in several ethmoid air cells. Orbits appear symmetric bilaterally. Other: Mastoid air cells on the left are clear. There is opacification in a few inferior mastoid air cells on the right. IMPRESSION: Age related volume loss with mild patchy periventricular small vessel disease. No acute infarct. No mass or hemorrhage. Foci of arterial vascular calcification noted. Mucosal thickening noted in several paranasal sinuses as well as opacification in a few inferior mastoid air cells on the right. Electronically Signed   By: Lowella Grip III M.D.   On: November 29, 2019 13:08   DG Chest Port 1 View  Result Date: Nov 29, 2019 CLINICAL DATA:  Weakness, slurred speech. EXAM: PORTABLE CHEST 1 VIEW COMPARISON:  May 20, 2017. FINDINGS: The heart size and mediastinal contours are within normal limits. Status post coronary bypass graft. No pneumothorax is noted. Left lung is clear. Small right pleural effusion is noted. New right upper lobe airspace opacity is noted concerning for pneumonia. The visualized skeletal structures are unremarkable. IMPRESSION: New right upper lobe airspace opacity is noted concerning for pneumonia. Small right pleural effusion is noted. Followup PA and lateral chest X-ray is recommended in 3-4 weeks following trial of antibiotic therapy to ensure resolution and exclude underlying malignancy. Electronically Signed   By: Marijo Conception M.D.   On: November 29, 2019 12:38      Medications:   . sodium chloride 125 mL/hr at 11-29-19 1458  . azithromycin 500 mg (29-Nov-2019 1457)  . cefTRIAXone (ROCEPHIN)  IV Stopped (11/29/2019 1436)   . [START ON 11/24/2019] aspirin EC  81 mg Oral Daily  .  atorvastatin  80 mg Oral QPM  . clopidogrel  75 mg Oral Daily  . [START ON 11/24/2019] fluticasone furoate-vilanterol  1 puff Inhalation Daily   And  . [START ON 11/24/2019] umeclidinium bromide  1 puff Inhalation Daily  . heparin  5,000 Units Subcutaneous Q8H  . latanoprost  1 drop Left Eye QHS  . [START ON 11/24/2019] levothyroxine  50 mcg Oral QAC breakfast  . ranolazine  500 mg Oral BID  . [START ON 11/24/2019] vitamin B-12  1,000 mcg Oral Daily   acetaminophen **OR** acetaminophen, cyclobenzaprine, ondansetron **OR** ondansetron (ZOFRAN) IV  Assessment/ Plan:  [name] is a 79 y.o. 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 Bernardo Heater 05/09/19 who presents to [facility] on 2019-12-05 for Sepsis ([place]) [A41.9]  1. Acute renal failure on chronic kidney disease stage IIIB. Baseline creatinine of 1.67, GFR of 38 on 11/10/19 2. Hyperkalemia 3. Hyponatremia: 4. Metabolic acidosis: anion gap present 5. Anemia with chronic kidney disease and iron deficiency anemia.  6. Sepsis: currently not requiring vasopressors.   Low threshold for renal replacement therapy - holding lisinopril, hydrochlorothiazide, furosemide and nebivolol.  - start IV fluids: change to sodium bicarbonate - check renal ultrasound - urinalysis - urine culture.  - serial sodium checks   LOS: 0 Doristine Shehan 2021/10/033:38 PM

## 2019-12-03 NOTE — ED Provider Notes (Signed)
[facility] Department of Emergency Medicine   Code Blue CONSULT NOTE  Chief Complaint: Cardiac arrest/unresponsive   Level V Caveat: Unresponsive  History of present illness: Was counted by patient's bedside nurses the patient was noted be bradycardic  He is noted to be cyanotic with agonal breathing and no pulse. PEA. CPR started. BVM initiated and code initiated by me and RN at bedside immediately.    ROS: Unable to obtain, Level V caveat  Scheduled Meds: . [START ON 11/24/2019] aspirin EC  81 mg Oral Daily  . clopidogrel  75 mg Oral Daily  . [START ON 11/24/2019] fluticasone furoate-vilanterol  1 puff Inhalation Daily   And  . [START ON 11/24/2019] umeclidinium bromide  1 puff Inhalation Daily  . heparin  5,000 Units Subcutaneous Q8H  . latanoprost  1 drop Left Eye QHS  . [START ON 11/24/2019] levothyroxine  50 mcg Oral QAC breakfast  . ranolazine  500 mg Oral BID  . [START ON 11/24/2019] vitamin B-12  1,000 mcg Oral Daily   Continuous Infusions: . azithromycin Stopped (12/13/2019 1821)  . cefTRIAXone (ROCEPHIN)  IV Stopped (December 13, 2019 1436)  . sodium bicarbonate (isotonic) 150 mEq in D5W 1000 mL infusion     PRN Meds:.acetaminophen **OR** acetaminophen, cyclobenzaprine, ondansetron **OR** ondansetron (ZOFRAN) IV Past Medical History:  Diagnosis Date  . Anemia   . COPD (chronic obstructive pulmonary disease) ([place])   . Coronary artery disease   . Glaucoma   . H/O ETOH abuse   . History of shingles   . Hyperlipidemia   . Hypertension   . Hypothyroidism    Past Surgical History:  Procedure Laterality Date  . CHOLECYSTECTOMY N/A 01/18/2019   Procedure: LAPAROSCOPIC CHOLECYSTECTOMY WITH INTRAOPERATIVE CHOLANGIOGRAM;  Surgeon: Olean Ree, MD;  Location: [facility] ORS;  Service: General;  Laterality: N/A;  . COLONOSCOPY WITH PROPOFOL N/A 09/23/2014   Procedure: COLONOSCOPY WITH PROPOFOL;  Surgeon: Josefine Class, MD;  Location: [facility] ENDOSCOPY;  Service:  Endoscopy;  Laterality: N/A;  . CORONARY ANGIOPLASTY    . CORONARY ARTERY BYPASS GRAFT    . CYSTOSCOPY W/ RETROGRADES Bilateral 05/09/2019   Procedure: CYSTOSCOPY WITH RETROGRADE PYELOGRAM;  Surgeon: Abbie Sons, MD;  Location: [facility] ORS;  Service: Urology;  Laterality: Bilateral;  . CYSTOSCOPY WITH BIOPSY N/A 05/09/2019   Procedure: CYSTOSCOPY WITH BIOPSY;  Surgeon: Abbie Sons, MD;  Location: [facility] ORS;  Service: Urology;  Laterality: N/A;  . ENDARTERECTOMY Right 02/12/2018   Procedure: ENDARTERECTOMY CAROTID;  Surgeon: Algernon Huxley, MD;  Location: [facility] ORS;  Service: Vascular;  Laterality: Right;  . ERCP N/A 04/22/2017   Procedure: ENDOSCOPIC RETROGRADE CHOLANGIOPANCREATOGRAPHY (ERCP);  Surgeon: Lucilla Lame, MD;  Location: [facility] ENDOSCOPY;  Service: Endoscopy;  Laterality: N/A;  . EYE SURGERY Bilateral    cataract  . HEMATOMA EVACUATION N/A 05/09/2019   Procedure: EVACUATION HEMATOMA;  Surgeon: Abbie Sons, MD;  Location: [facility] ORS;  Service: Urology;  Laterality: N/A;  . TRANSURETHRAL RESECTION OF BLADDER TUMOR N/A 05/09/2019   Procedure: TRANSURETHRAL RESECTION OF BLADDER TUMOR (TURBT);  Surgeon: Abbie Sons, MD;  Location: [facility] ORS;  Service: Urology;  Laterality: N/A;   Social History   Socioeconomic History  . Marital status: Married    Spouse name: Not on file  . Number of children: Not on file  . Years of education: Not on file  . Highest education level: Not on file  Occupational History  . Not on file  Tobacco Use  . Smoking status: Former Smoker  Types: Cigarettes  . Smokeless tobacco: Never Used  . Tobacco comment: 30 years ago  Vaping Use  . Vaping Use: Never used  Substance and Sexual Activity  . Alcohol use: No  . Drug use: No  . Sexual activity: Not on file  Other Topics Concern  . Not on file  Social History Narrative  . Not on file   Social Determinants of Health   Financial Resource Strain:   . Difficulty of Paying Living Expenses: Not on  file  Food Insecurity:   . Worried About Charity fundraiser in the Last Year: Not on file  . Ran Out of Food in the Last Year: Not on file  Transportation Needs:   . Lack of Transportation (Medical): Not on file  . Lack of Transportation (Non-Medical): Not on file  Physical Activity:   . Days of Exercise per Week: Not on file  . Minutes of Exercise per Session: Not on file  Stress:   . Feeling of Stress : Not on file  Social Connections:   . Frequency of Communication with Friends and Family: Not on file  . Frequency of Social Gatherings with Friends and Family: Not on file  . Attends Religious Services: Not on file  . Active Member of Clubs or Organizations: Not on file  . Attends Archivist Meetings: Not on file  . Marital Status: Not on file  Intimate Partner Violence:   . Fear of Current or Ex-Partner: Not on file  . Emotionally Abused: Not on file  . Physically Abused: Not on file  . Sexually Abused: Not on file   No Known Allergies  Last set of Vital Signs (not current) Vitals:   Dec 10, 2019 1845 December 10, 2019 1900  BP:  (!) 63/34  Pulse: 73   Resp: (!) 27 17  Temp: (!) 93.1 F (33.9 C) (!) 93 F (33.9 C)  SpO2: (!) 68%       Physical Exam  Gen: unresponsive Cardiovascular: pulseless  Resp: apneic. Breath sounds equal bilaterally with bagging  Abd: nondistended  Neuro: GCS 3, unresponsive to pain  HEENT: No blood in posterior pharynx, gag reflex absent  Neck: No crepitus  Musculoskeletal: No deformity  Skin: warm  Procedures (when applicable, including Critical Care time): Procedure Name: Intubation Date/Time: Dec 10, 2019 7:09 PM Performed by: Delman Kitten, MD Pre-anesthesia Checklist: Patient identified, Patient being monitored, Emergency Drugs available, Timeout performed and Suction available Oxygen Delivery Method: Non-rebreather mask Preoxygenation: Pre-oxygenation with 100% oxygen Induction Type: Rapid sequence Ventilation: Mask ventilation  without difficulty Laryngoscope Size: Glidescope and 3 Grade View: Grade I Tube size: 7.0 mm Number of attempts: 4 Placement Confirmation: ETT inserted through vocal cords under direct vision,  CO2 detector and Breath sounds checked- equal and bilateral Tube secured with: ETT holder Dental Injury: Teeth and Oropharynx as per pre-operative assessment  Difficulty Due To: Difficulty was unanticipated Comments: Glidescope turned off three times during initial attempt. Able to intubate after BVM and improved hypoxia with new glidescope without difficulyt.       CRITICAL CARE Performed by: Delman Kitten   Total critical care time: 25 minutes  Critical care time was exclusive of separately billable procedures and treating other patients.  Critical care was necessary to treat or prevent imminent or life-threatening deterioration.  Critical care was time spent personally by me on the following activities: development of treatment plan with patient and/or surrogate as well as nursing, discussions with consultants, evaluation of patient's response to treatment, examination of patient,  obtaining history from patient or surrogate, ordering and performing treatments and interventions, ordering and review of laboratory studies, ordering and review of radiographic studies, pulse oximetry and re-evaluation of patient's condition.   Cardiopulmonary Resuscitation (CPR) Procedure Note Directed/Performed by: Delman Kitten I personally directed ancillary staff and/or performed CPR in an effort to regain return of spontaneous circulation and to maintain cardiac, neuro and systemic perfusion.    MDM / Assessment and Plan Care turned over to Dr. Francine Graven at bedside with after return of circulation.  Additionally I gave a dopamine infusion for postarrest bradycardia and hypotension.  Ongoing care assigned to Dr. Francine Graven.      Delman Kitten, MD 2019-12-04 1929

## 2019-12-03 NOTE — ED Notes (Signed)
Dr Abigail Butts in with pt now

## 2019-12-03 NOTE — ED Notes (Signed)
Wife and daughter notified by dr about change in condition .

## 2019-12-03 NOTE — ED Notes (Signed)
Pt on vent.   Iv meds infusing  Pt bradycardic.  foley in place   bair hugger on pt.

## 2019-12-03 NOTE — ED Triage Notes (Signed)
Per [facility]:  Dx with pneumonia on Wednesday.  Still feels sob.  End tidal 9, weak.  He is sitting in wheelchair. No distress.

## 2019-12-03 NOTE — Progress Notes (Signed)
CODE SEPSIS - PHARMACY COMMUNICATION  **Broad Spectrum Antibiotics should be administered within 1 hour of Sepsis diagnosis**  Time Code Sepsis Called/Page Received: 9/21 1247  Antibiotics Ordered: Azith/CTX  Time of 1st antibiotic administration: 7564  Additional action taken by pharmacy:    If necessary, Name of Provider/Nurse Contacted:      Noralee Space ,PharmD Clinical Pharmacist  12-12-19  1:48 PM

## 2019-12-03 NOTE — ED Notes (Signed)
Resumed care from [employer].  Pt awake.  Iv meds infising.  bair Music therapist in place.

## 2019-12-03 NOTE — ED Notes (Signed)
Bair hugger applied.

## 2019-12-03 NOTE — ED Notes (Signed)
This rn spoke with dr agbata via phone,  Iv ns fluids infusing wide open.

## 2019-12-03 NOTE — Progress Notes (Addendum)
Pt expired at 2247. Pronounced by Laurin Coder RN and Tad Moore RN.  Wife and neighbor at bedside.

## 2019-12-03 NOTE — ED Provider Notes (Signed)
Procedures   ----------------------------------------- 3:41 PM on 2019-12-10 -----------------------------------------  Findings and plan of care discussed with the patient's spouse   Carrie Mew, MD 12-10-19 1542

## 2019-12-03 NOTE — ED Notes (Signed)
Pt bagged by RT

## 2019-12-03 NOTE — ED Triage Notes (Addendum)
Pt in via [facility], reports worsening weakness x one week, being unable to ambulate due to weakness.  Slurred speech noted in triage; states that is new since he has been here.  Pt A/Ox4, vitals abnormal.

## 2019-12-03 NOTE — ED Provider Notes (Signed)
[facility] Emergency Department Provider Note  ____________________________________________  Time seen: Approximately 2:09 PM  I have reviewed the triage vital signs and the nursing notes.   HISTORY  Chief Complaint Weakness    HPI Jason House is a 79 y.o. male with a history of COPD hypertension hyperlipidemia hypothyroidism who comes the ED due to worsening generalized weakness for the past 1 week, gradual onset.  Now he is so weak that he is unable to stand or ambulate.  He also has decreased oral intake over the past few days, denies vomiting or diarrhea.  No fevers.  No alleviating factors.  Symptoms are severe.  Associated with shortness of breath, no chest pain.  He was diagnosed with pneumonia 6 days ago and started on antibiotics but feels like he is continuing to worsen.  Patient also feels that today he is having slurred speech which is new for him.  Denies alcohol or other possible sedative intake  Past Medical History:  Diagnosis Date  . Anemia   . COPD (chronic obstructive pulmonary disease) ([place])   . Coronary artery disease   . Glaucoma   . H/O ETOH abuse   . History of shingles   . Hyperlipidemia   . Hypertension   . Hypothyroidism      Patient Active Problem List   Diagnosis Date Noted  . Urothelial carcinoma ([place]) 05/28/2019  . 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 Acute blood loss anemia   . Hematuria 05/07/2019  . PVD (peripheral vascular disease) ([place])   . Gallstone pancreatitis   . Carotid stenosis, asymptomatic, right 02/12/2018  . Hypothyroidism 07/22/2017  . Calculus of bile duct without cholecystitis and without obstruction   . Encounter for fitting and adjustment of other gastrointestinal appliance and device   . Gram-negative bacteremia 04/18/2017  . Atherosclerotic heart disease of native coronary artery without angina pectoris 04/08/2017  . Chronic kidney disease 04/08/2017  . Glaucoma 04/08/2017  .  Choledocholithiasis 03/27/2017  . Jaundice   . Acute pancreatitis   . Bilateral carotid artery stenosis 01/28/2017  . Hyperlipidemia 01/28/2017  . Essential hypertension 01/28/2017  . Anemia 04/22/2012     Past Surgical History:  Procedure Laterality Date  . CHOLECYSTECTOMY N/A 01/18/2019   Procedure: LAPAROSCOPIC CHOLECYSTECTOMY WITH INTRAOPERATIVE CHOLANGIOGRAM;  Surgeon: Olean Ree, MD;  Location: [facility] ORS;  Service: General;  Laterality: N/A;  . COLONOSCOPY WITH PROPOFOL N/A 09/23/2014   Procedure: COLONOSCOPY WITH PROPOFOL;  Surgeon: Josefine Class, MD;  Location: [facility] ENDOSCOPY;  Service: Endoscopy;  Laterality: N/A;  . CORONARY ANGIOPLASTY    . CORONARY ARTERY BYPASS GRAFT    . CYSTOSCOPY W/ RETROGRADES Bilateral 05/09/2019   Procedure: CYSTOSCOPY WITH RETROGRADE PYELOGRAM;  Surgeon: Abbie Sons, MD;  Location: [facility] ORS;  Service: Urology;  Laterality: Bilateral;  . CYSTOSCOPY WITH BIOPSY N/A 05/09/2019   Procedure: CYSTOSCOPY WITH BIOPSY;  Surgeon: Abbie Sons, MD;  Location: [facility] ORS;  Service: Urology;  Laterality: N/A;  . ENDARTERECTOMY Right 02/12/2018   Procedure: ENDARTERECTOMY CAROTID;  Surgeon: Algernon Huxley, MD;  Location: [facility] ORS;  Service: Vascular;  Laterality: Right;  . ERCP N/A 04/22/2017   Procedure: ENDOSCOPIC RETROGRADE CHOLANGIOPANCREATOGRAPHY (ERCP);  Surgeon: Lucilla Lame, MD;  Location: [facility];  Service: Endoscopy;  Laterality: N/A;  . EYE SURGERY Bilateral    cataract  . HEMATOMA EVACUATION N/A 05/09/2019   Procedure: EVACUATION HEMATOMA;  Surgeon: Abbie Sons, MD;  Location: [facility] ORS;  Service: Urology;  Laterality: N/A;  .  TRANSURETHRAL RESECTION OF BLADDER TUMOR N/A 05/09/2019   Procedure: TRANSURETHRAL RESECTION OF BLADDER TUMOR (TURBT);  Surgeon: Abbie Sons, MD;  Location: [facility] ORS;  Service: Urology;  Laterality: N/A;     Prior to Admission medications   Medication Sig Start Date End Date Taking? Authorizing Provider   Albuterol Sulfate (PROAIR RESPICLICK) 701 (90 Base) MCG/ACT AEPB Inhale 1 puff into the lungs every 4 (four) hours as needed (shortness of breath). 05/15/17   Darel Hong, MD  aspirin 81 MG EC tablet Take 81 mg by mouth daily.     [provider]  atorvastatin (LIPITOR) 80 MG tablet Take 80 mg by mouth every evening.     [provider]  BYSTOLIC 5 MG tablet Take 5 mg by mouth daily. 12/19/17   [provider]  clopidogrel (PLAVIX) 75 MG tablet Take 75 mg by mouth daily. 05/22/19   [provider]  cyclobenzaprine (FLEXERIL) 10 MG tablet Take 10 mg by mouth daily as needed for muscle spasms.     [provider]  dorzolamide-timolol (COSOPT) 22.3-6.8 MG/ML ophthalmic solution Place 1 drop into the left eye 2 (two) times daily.     [provider]  furosemide (LASIX) 20 MG tablet Take 20 mg by mouth daily as needed for fluid or edema.  11/10/19   [provider]  hydrochlorothiazide (HYDRODIURIL) 25 MG tablet Take 25 mg by mouth daily.  09/07/12   [provider]  latanoprost (XALATAN) 0.005 % ophthalmic solution Place 1 drop into the left eye at bedtime.     [provider]  levothyroxine (SYNTHROID, LEVOTHROID) 50 MCG tablet Take 50 mcg by mouth daily before breakfast.  01/04/17   [provider]  lisinopril (PRINIVIL,ZESTRIL) 10 MG tablet Take 10 mg by mouth daily. 12/20/17   [provider]  mupirocin ointment (BACTROBAN) 2 % Place 1 application into the nose 2 (two) times daily. Patient not taking: Reported on 11/15/2019 05/09/19   Loletha Grayer, MD  ranolazine (RANEXA) 500 MG 12 hr tablet Take 500 mg by mouth 2 (two) times daily.  12/30/17   [provider]  TRELEGY ELLIPTA 100-62.5-25 MCG/INH AEPB Inhale 1 puff into the lungs daily. 10/03/19   [provider]  vitamin B-12 (CYANOCOBALAMIN) 1000 MCG tablet Take 1,000 mcg by mouth daily.    [provider]      Allergies Patient has no known allergies.   Family History  Problem Relation Age of Onset  . Diabetes Mother   . CAD Mother   . Cancer Sister   . Throat cancer Brother   . Cirrhosis Brother   . COPD Sister   . COPD Sister   . Diabetes Sister     Social History Social History   Tobacco Use  . Smoking status: Former Smoker    Types: Cigarettes  . Smokeless tobacco: Never Used  . Tobacco comment: 30 years ago  Vaping Use  . Vaping Use: Never used  Substance Use Topics  . Alcohol use: No  . Drug use: No    Review of Systems  Constitutional:   No fever positive chills.  ENT:   No sore throat. No rhinorrhea. Cardiovascular:   No chest pain or syncope. Respiratory: Positive shortness of breath and cough. Gastrointestinal:   Negative for abdominal pain, vomiting and diarrhea.  Musculoskeletal:   Negative for focal pain or swelling All other systems reviewed and are negative except as documented above in ROS and HPI.  ____________________________________________  PHYSICAL EXAM:  VITAL SIGNS: ED Triage Vitals  Enc Vitals Group     BP 11-29-19 1210 (!) 92/46     Pulse Rate 11/29/2019 1210 (!) 42     Resp 11-29-19 1210 20     Temp 2019/11/29 1210 (!) 92 F (33.3 C)     Temp Source 11/29/2019 1210 Oral     SpO2 11-29-19 1210 94 %     Weight 11/29/19 1211 139 lb 15.9 oz (63.5 kg)     Height Nov 29, 2019 1211 5\' 7"  (1.702 m)     Head Circumference --      Peak Flow --      Pain Score 11-29-2019 1211 0     Pain Loc --      Pain Edu? --      Excl. in [place]? --     Vital signs reviewed, nursing assessments reviewed.   Constitutional:   Alert and oriented.  Ill-appearing. Eyes:   Conjunctivae are normal. EOMI. PERRL. ENT      Head:   Normocephalic and atraumatic.      Nose:   Normal      Mouth/Throat: Dry mucous membranes.      Neck:   No meningismus. Full ROM. Hematological/Lymphatic/Immunilogical:   No cervical lymphadenopathy. Cardiovascular:   Bradycardia heart  rate 45. Symmetric bilateral radial and DP pulses.  No murmurs. Cap refill less than 2 seconds. Respiratory:   Normal respiratory effort.  Fine crackles bilateral lungs.  No wheezing. Gastrointestinal:   Soft and nontender. Non distended. There is no CVA tenderness.  No rebound, rigidity, or guarding.  Musculoskeletal:   Normal range of motion in all extremities. No joint effusions.  No lower extremity tenderness.  No edema. Neurologic:   Normal language, slightly dysarthric speech Motor grossly intact.  Sensation intact, cerebellar function intact  Skin:    Skin is warm, dry and intact. No rash noted.  No petechiae, purpura, or bullae.  ____________________________________________    LABS (pertinent positives/negatives) (all labs ordered are listed, but only abnormal results are displayed) Labs Reviewed  COMPREHENSIVE METABOLIC PANEL - Abnormal; Notable for the following components:      Result Value   Sodium 117 (*)    Potassium 5.2 (*)    Chloride 87 (*)    CO2 11 (*)    Glucose, Bld 108 (*)    BUN 150 (*)    Creatinine, Ser 6.38 (*)    Calcium 7.9 (*)    Total Protein 6.3 (*)    Albumin 2.4 (*)    AST 300 (*)    ALT 104 (*)    Total Bilirubin 1.5 (*)    GFR calc non Af Amer 8 (*)    GFR calc Af Amer 9 (*)    Anion gap 19 (*)    All other components within normal limits  CBC WITH DIFFERENTIAL/PLATELET - Abnormal; Notable for the following components:   WBC 41.1 (*)    RBC 3.90 (*)    Hemoglobin 11.4 (*)    HCT 31.0 (*)    MCV 79.5 (*)    MCHC 36.8 (*)    Platelets 529 (*)    Neutro Abs 30.9 (*)    Lymphs Abs 7.4 (*)    Monocytes Absolute 1.6 (*)    Abs Immature Granulocytes 1.08 (*)    All other components within normal limits  PROTIME-INR - Abnormal; Notable for the following components:   Prothrombin Time 16.4 (*)    INR 1.4 (*)  All other components within normal limits  CULTURE, BLOOD (ROUTINE X 2)  CULTURE, BLOOD (ROUTINE X 2)  SARS CORONAVIRUS 2 BY RT  PCR (HOSPITAL ORDER, [facility])  LACTIC ACID, PLASMA  LACTIC ACID, PLASMA   ____________________________________________   EKG  Interpreted by me Sinus bradycardia rate of 42, normal axis and intervals.  Normal QRS ST segments and T waves.  No ischemic changes.  ____________________________________________    RADIOLOGY  CT Head Wo Contrast  Result Date: December 15, 2019 CLINICAL DATA:  Altered mental status EXAM: CT HEAD WITHOUT CONTRAST TECHNIQUE: Contiguous axial images were obtained from the base of the skull through the vertex without intravenous contrast. COMPARISON:  March 25, 2017 FINDINGS: Brain: There is stable age related volume loss. There is no intracranial mass, hemorrhage, extra-axial fluid collection, or midline shift. There is mild patchy small vessel disease in the centra semiovale bilaterally. Elsewhere, brain parenchyma appears unremarkable. No acute infarct is appreciable. Vascular: There is no hyperdense vessel. There is calcification in each carotid siphon region. Skull: Bony calvarium appears intact. Sinuses/Orbits: There is mucosal thickening in the posterior right maxillary antrum. There is mucosal thickening and opacification in several ethmoid air cells. Orbits appear symmetric bilaterally. Other: Mastoid air cells on the left are clear. There is opacification in a few inferior mastoid air cells on the right. IMPRESSION: Age related volume loss with mild patchy periventricular small vessel disease. No acute infarct. No mass or hemorrhage. Foci of arterial vascular calcification noted. Mucosal thickening noted in several paranasal sinuses as well as opacification in a few inferior mastoid air cells on the right. Electronically Signed   By: Lowella Grip III M.D.   On: 2019/12/15 13:08   DG Chest Port 1 View  Result Date: December 15, 2019 CLINICAL DATA:  Weakness, slurred speech. EXAM: PORTABLE CHEST 1 VIEW COMPARISON:  May 20, 2017. FINDINGS:  The heart size and mediastinal contours are within normal limits. Status post coronary bypass graft. No pneumothorax is noted. Left lung is clear. Small right pleural effusion is noted. New right upper lobe airspace opacity is noted concerning for pneumonia. The visualized skeletal structures are unremarkable. IMPRESSION: New right upper lobe airspace opacity is noted concerning for pneumonia. Small right pleural effusion is noted. Followup PA and lateral chest X-ray is recommended in 3-4 weeks following trial of antibiotic therapy to ensure resolution and exclude underlying malignancy. Electronically Signed   By: Marijo Conception M.D.   On: 12/15/19 12:38    ____________________________________________   PROCEDURES .Critical Care Performed by: Carrie Mew, MD Authorized by: Carrie Mew, MD   Critical care provider statement:    Critical care time (minutes):  35   Critical care time was exclusive of:  Separately billable procedures and treating other patients   Critical care was necessary to treat or prevent imminent or life-threatening deterioration of the following conditions:  Sepsis, shock, metabolic crisis and dehydration   Critical care was time spent personally by me on the following activities:  Development of treatment plan with patient or surrogate, discussions with consultants, evaluation of patient's response to treatment, examination of patient, obtaining history from patient or surrogate, ordering and performing treatments and interventions, ordering and review of laboratory studies, ordering and review of radiographic studies, pulse oximetry, re-evaluation of patient's condition and review of old charts    ____________________________________________  DIFFERENTIAL DIAGNOSIS   Pneumonia, pulmonary edema, dehydration, electrolyte abnormality, anemia, metabolic acidosis, pulmonary embolism  CLINICAL IMPRESSION / ASSESSMENT AND PLAN / ED COURSE  Medications ordered  in the ED: Medications  cefTRIAXone (ROCEPHIN) 2 g in sodium chloride 0.9 % 100 mL IVPB (2 g Intravenous New Bag/Given 2019-12-15 1315)  azithromycin (ZITHROMAX) 500 mg in sodium chloride 0.9 % 250 mL IVPB (has no administration in time range)  sodium chloride 0.9 % bolus 1,000 mL (1,000 mLs Intravenous New Bag/Given 2019/12/15 1247)    And  sodium chloride 0.9 % bolus 1,000 mL (1,000 mLs Intravenous New Bag/Given 12/15/19 1247)    Pertinent labs & imaging results that were available during my care of the patient were reviewed by me and considered in my medical decision making (see chart for details).  Jason House was evaluated in Emergency Department on 2019/12/15 for the symptoms described in the history of present illness. He was evaluated in the context of the global COVID-19 pandemic, which necessitated consideration that the patient might be at risk for infection with the SARS-CoV-2 virus that causes COVID-19. Institutional protocols and algorithms that pertain to the evaluation of patients at risk for COVID-19 are in a state of rapid change based on information released by regulatory bodies including the CDC and federal and state organizations. These policies and algorithms were followed during the patient's care in the ED.   Patient presents with generalized weakness and shortness of breath worsening over the past week.  Initial vital signs show hypothermia bradycardia and hypotension concerning for septic shock in the setting of recent pneumonia diagnosis.  Initial chest x-ray reviewed by me, shows right upper lobe infiltrate consistent with pneumonia  Check  labs, give IV fluids and start antibiotics with ceftriaxone and azithromycin IV.   Clinical Course as of Nov 23 1414  Tue Nov 23, 2019  1330 Labs with multiple severe abnormalities including a leukocytosis of 41,000, sodium of 117, creatinine of 6 which is acute, elevated LFTs.  Lactate is normal.   [PS]    Clinical Course User  Index [PS] Carrie Mew, MD    ----------------------------------------- 2:15 PM on 2019/12/15 -----------------------------------------  Blood pressure improved and normal, respirations normal.  Remains hypothermic, on Bair hugger. D/w hospitalist.  Will order Foley placement due to acute renal failure with a BUN of 150 and a creatinine of 6.   ____________________________________________   FINAL CLINICAL IMPRESSION(S) / ED DIAGNOSES    Final diagnoses:  Multifocal pneumonia  Septic shock ([place])  Acute renal failure, unspecified acute renal failure type ([place])  Elevated LFTs     ED Discharge Orders    None      Portions of this note were generated with dragon dictation software. Dictation errors may occur despite best attempts at proofreading.   Carrie Mew, MD Dec 15, 2019 1416

## 2019-12-03 NOTE — Consult Note (Signed)
Pharmacy Antibiotic Note  Jason House is a 79 y.o. male admitted on Dec 02, 2019 with sepsis.  Pharmacy has been consulted for cefepime and vancomycin dosing. Pt was unresponsive and had a cardiac arrest and code blue was called. Pt's creatinine is elevated.   Plan: Cefepime 1 g q24H per renal function  Ordered a loading dose of vancomycin 1500 mg x 1. Follow up with renal function and potentially change to daptomycin per to the vancomycin reagent shortage.   Height: 5' 7.01" (170.2 cm) Weight: 63.5 kg (139 lb 15.9 oz) IBW/kg (Calculated) : 66.12  Temp (24hrs), Avg:93.5 F (34.2 C), Min:92 F (33.3 C), Max:94.2 F (34.6 C)  Recent Labs  Lab 12/02/2019 1219  WBC 41.1*  CREATININE 6.38*  LATICACIDVEN 1.4    Estimated Creatinine Clearance: 8.4 mL/min (A) (by C-G formula based on SCr of 6.38 mg/dL (H)).    No Known Allergies  Antimicrobials this admission: 9/21 azithromycin and ceftriaxone x 1  9/21 cefepime  >>  9/21 vancomycin >>   Dose adjustments this admission: none  Microbiology results: 9/21 BCx: pending 9/21 UCx: pending   Thank you for allowing pharmacy to be a part of this patient's care.  Oswald Hillock, PharmD, BCPS 12-02-19 8:20 PM

## 2019-12-03 NOTE — ED Notes (Signed)
fsbs 98

## 2019-12-03 NOTE — Progress Notes (Signed)
Pt extubated to room air per NP order. RN and family at bedside.

## 2019-12-03 NOTE — ED Notes (Signed)
1835 no pulse cpr started.  1837 epi given  1840 pt has pulses, cpr stopped. Pt bagged by RT

## 2019-12-03 NOTE — ED Notes (Signed)
U/s in with pt.  Brady on monitor at 49.  Pt awake.  Iv fluids infusing.

## 2019-12-03 NOTE — ED Notes (Signed)
Intubated by dr Jacqualine Code with 7.0 ett, 21 at the lip

## 2019-12-03 NOTE — Consult Note (Signed)
PULMONARY / CRITICAL CARE MEDICINE   Name: Jason House MRN: [id number] DOB: [birth year]    ADMISSION DATE:  12-06-2019 CONSULTATION DATE:  12-06-2019  REFERRING MD:  Dr. Billie Ruddy  CHIEF COMPLAINT:  Cardiac arrest  BRIEF DISCUSSION: 79 y.o. male admitted with Severe sepsis secondary to Pneumonia.  Suffered PEA Cardiac arrest while in the ED (approx. 5 minutes in duration before ROSC).  HISTORY OF PRESENT ILLNESS:   Jason House is a 79 year old male with a past medical history significant for COPD, hypertension, hypothyroidism who presented to [facility] ED on 12/06/2019 due to progressive weakness over the past 1 week.  Patient was unable to stand or ambulate and stated he needed 2 person assist to get out of bed.  He also had poor p.o. intake over the last couple days.  He endorsed a productive cough of greenish phlegm, as well as shortness of breath.  He denied chest pain, fever, chills, nausea, vomiting, diarrhea, abdominal pain.  Of note he was diagnosed with pneumonia about 6 days prior to admission and was receiving outpatient Levaquin without any noted improvement in his symptoms.  Upon presentation to the ED he was noted to be hypotensive, bradycardic, and hypothermic.  Initial systolic blood pressure in the 80s but improved with IV fluid resuscitation.  Initial work-up in the ED revealed sodium 117, potassium 5.2, chloride 87, bicarb 11, BUN 150, creatinine 6.38, calcium 7.9, anion gap 19, AST 300, ALT 104, total protein 6.3, total bilirubin 1.5, lactic acid 1.4, WBC 41 with a left shift, hemoglobin 11.4, hematocrit 31, platelets 529.  Chest x-ray showed right upper lobe airspace opacity.  CT scan of the head negative for any acute intracranial processes.  EKG with marked sinus bradycardia.  He was be to be admitted by the hospitalist for further work-up and treatment of severe sepsis secondary to right upper lobe pneumonia, along with acute kidney injury, sinus bradycardia, and transaminitis.   Nephrology and cardiology were consulted.  While awaiting bed placement in the ED he suffered a brief PEA cardiac arrest.  He required approximately 5 minutes of ACLS, and was intubated by the ED provider with return of ROSC.  Post cardiac arrest he was unresponsive with fixed and dilated pupils, was severely hypoxic and hypotensive requiring multiple vasopressors.  Postarrest EKG without obvious signs of ischemia.  Suspect cardiac arrest occurred in the setting of severe sepsis and severe metabolic acidosis.  PCCM was consulted for further work-up and management of cardiac arrest, severe sepsis and metabolic acidosis, acute kidney injury, hyperkalemia, and transaminitis.  Upon arrival to ICU patient with multiorgan failure, severe hypoxia despite full vent support, and severe hypotension despite 3 vasopressors and stress dose steroids.  Patient also with signs of anoxic injury as patient unresponsive with fixed and dilated pupils, no cough/gag/corneal reflexes.  Patient is actively dying and appears to be suffering.  Patient's wife was brought to bedside and updated.  She reported patient would have never wanted this and would not want to live this way, therefore she elected to transition to comfort care measures and discontinue all life-sustaining measures.  PAST MEDICAL HISTORY :  He  has a past medical history of Anemia, COPD (chronic obstructive pulmonary disease) ([place]), Coronary artery disease, Glaucoma, H/O ETOH abuse, History of shingles, Hyperlipidemia, Hypertension, and Hypothyroidism.  PAST SURGICAL HISTORY: He  has a past surgical history that includes Coronary artery bypass graft; Coronary angioplasty; Colonoscopy with propofol (N/A, 09/23/2014); ERCP (N/A, 04/22/2017); Eye surgery (Bilateral); Endarterectomy (Right, 02/12/2018); Cholecystectomy (N/A,  01/18/2019); Cystoscopy w/ retrogrades (Bilateral, 05/09/2019); Hematoma evacuation (N/A, 05/09/2019); Transurethral resection of bladder tumor  (N/A, 05/09/2019); and Cystoscopy with biopsy (N/A, 05/09/2019).  No Known Allergies  No current facility-administered medications on file prior to encounter.   Current Outpatient Medications on File Prior to Encounter  Medication Sig  . Albuterol Sulfate (PROAIR RESPICLICK) 500 (90 Base) MCG/ACT AEPB Inhale 1 puff into the lungs every 4 (four) hours as needed (shortness of breath).  Marland Kitchen aspirin 81 MG EC tablet Take 81 mg by mouth daily.   Marland Kitchen atorvastatin (LIPITOR) 80 MG tablet Take 80 mg by mouth every evening.   Marland Kitchen BYSTOLIC 5 MG tablet Take 5 mg by mouth daily.  . clopidogrel (PLAVIX) 75 MG tablet Take 75 mg by mouth daily.  . dorzolamide-timolol (COSOPT) 22.3-6.8 MG/ML ophthalmic solution Place 1 drop into the left eye 2 (two) times daily.   . furosemide (LASIX) 20 MG tablet Take 20 mg by mouth daily as needed for fluid or edema.   Marland Kitchen latanoprost (XALATAN) 0.005 % ophthalmic solution Place 1 drop into the left eye at bedtime.   Marland Kitchen levothyroxine (SYNTHROID, LEVOTHROID) 50 MCG tablet Take 50 mcg by mouth daily before breakfast.   . lisinopril (PRINIVIL,ZESTRIL) 10 MG tablet Take 10 mg by mouth daily.  . ranolazine (RANEXA) 500 MG 12 hr tablet Take 500 mg by mouth 2 (two) times daily.   . vitamin B-12 (CYANOCOBALAMIN) 1000 MCG tablet Take 1,000 mcg by mouth daily.    FAMILY HISTORY:  His He indicated that his mother is deceased. He indicated that his father is deceased. He indicated that all of his three sisters are deceased. He indicated that his brother is deceased.   SOCIAL HISTORY: He  reports that he has quit smoking. His smoking use included cigarettes. He has never used smokeless tobacco. He reports that he does not drink alcohol and does not use drugs.    COVID-19 DISASTER DECLARATION:  FULL CONTACT PHYSICAL EXAMINATION WAS NOT POSSIBLE DUE TO TREATMENT OF COVID-19 AND  CONSERVATION OF PERSONAL PROTECTIVE EQUIPMENT, LIMITED EXAM FINDINGS INCLUDE-  Patient assessed or the symptoms  described in the history of present illness.  In the context of the Global COVID-19 pandemic, which necessitated consideration that the patient might be at risk for infection with the SARS-CoV-2 virus that causes COVID-19, Institutional protocols and algorithms that pertain to the evaluation of patients at risk for COVID-19 are in a state of rapid change based on information released by regulatory bodies including the CDC and federal and state organizations. These policies and algorithms were followed during the patient's care while in hospital.   REVIEW OF SYSTEMS:   Unable to assess due to intubation and sedation  SUBJECTIVE:  Unable to assess due to intubation and sedation  VITAL SIGNS: BP (!) 63/34   Pulse 63   Temp (!) 92.7 F (33.7 C)   Resp (!) 23   Ht 5\' 7"  (1.702 m)   Wt 63.5 kg   SpO2 (!) 86%   BMI 21.93 kg/m   HEMODYNAMICS:    VENTILATOR SETTINGS:    INTAKE / OUTPUT: I/O last 3 completed shifts: In: 2100 [IV Piggyback:2100] Out: -   PHYSICAL EXAMINATION: General: Critically ill-appearing male, laying in bed, unresponsive, no acute distress Neuro: Unresponsive on no sedation, fixed and dilated pupils, no corneal/cough/gag reflexes HEENT: Atraumatic, normocephalic, neck supple, ET tube in place Cardiovascular: Bradycardia, regular rhythm, S1-S2, no murmurs, rubs, gallops Lungs: Coarse breath sounds bilaterally, vent assisted, even Abdomen: Soft, nontender,  nondistended, no guarding or rebound tenderness Musculoskeletal: No deformities, no edema Skin: Cold and dry.  No obvious rashes, lesions, ulcerations  LABS:  BMET Recent Labs  Lab Dec 18, 2019 1219 12-18-19 1746  NA 117* 122*  K 5.2*  --   CL 87*  --   CO2 11*  --   BUN 150*  --   CREATININE 6.38*  --   GLUCOSE 108*  --     Electrolytes Recent Labs  Lab 12/18/2019 1219  CALCIUM 7.9*    CBC Recent Labs  Lab 12/18/2019 1219  WBC 41.1*  HGB 11.4*  HCT 31.0*  PLT 529*    Coag's Recent Labs   Lab 18-Dec-2019 1219  INR 1.4*    Sepsis Markers Recent Labs  Lab Dec 18, 2019 1219 12/18/19 1746  LATICACIDVEN 1.4  --   PROCALCITON  --  <0.10    ABG No results for input(s): PHART, PCO2ART, PO2ART in the last 168 hours.  Liver Enzymes Recent Labs  Lab 12-18-19 1219  AST 300*  ALT 104*  ALKPHOS 55  BILITOT 1.5*  ALBUMIN 2.4*    Cardiac Enzymes No results for input(s): TROPONINI, PROBNP in the last 168 hours.  Glucose Recent Labs  Lab 18-Dec-2019 1853  GLUCAP 98    Imaging CT Head Wo Contrast  Result Date: December 18, 2019 CLINICAL DATA:  Altered mental status EXAM: CT HEAD WITHOUT CONTRAST TECHNIQUE: Contiguous axial images were obtained from the base of the skull through the vertex without intravenous contrast. COMPARISON:  March 25, 2017 FINDINGS: Brain: There is stable age related volume loss. There is no intracranial mass, hemorrhage, extra-axial fluid collection, or midline shift. There is mild patchy small vessel disease in the centra semiovale bilaterally. Elsewhere, brain parenchyma appears unremarkable. No acute infarct is appreciable. Vascular: There is no hyperdense vessel. There is calcification in each carotid siphon region. Skull: Bony calvarium appears intact. Sinuses/Orbits: There is mucosal thickening in the posterior right maxillary antrum. There is mucosal thickening and opacification in several ethmoid air cells. Orbits appear symmetric bilaterally. Other: Mastoid air cells on the left are clear. There is opacification in a few inferior mastoid air cells on the right. IMPRESSION: Age related volume loss with mild patchy periventricular small vessel disease. No acute infarct. No mass or hemorrhage. Foci of arterial vascular calcification noted. Mucosal thickening noted in several paranasal sinuses as well as opacification in a few inferior mastoid air cells on the right. Electronically Signed   By: Lowella Grip III M.D.   On: 12/18/19 13:08   US  RENAL  Result Date: 12-18-2019 CLINICAL DATA:  History of bladder cancer.  Acute renal failure. EXAM: RENAL / URINARY TRACT ULTRASOUND COMPLETE COMPARISON:  10/05/2019 FINDINGS: Right Kidney: Renal measurements: 9 x 5 x 4.7 cm = volume: 112 mL. There is no hydronephrosis. There is increased cortical echogenicity. Left Kidney: Renal measurements: 9.1 x 5.5 x 4.1 cm = volume: 105 mL. There is increased cortical echogenicity without evidence for hydronephrosis. Bladder: The urinary bladder is decompressed by Foley catheter. Other: None. IMPRESSION: 1. No hydronephrosis. 2. Echogenic kidneys bilaterally which can be seen in patients with medical renal disease. Electronically Signed   By: Constance Holster M.D.   On: 18-Dec-2019 18:35   DG Chest Portable 1 View  Result Date: 12/18/2019 CLINICAL DATA:  Status post intubation and orogastric tube placement. EXAM: PORTABLE CHEST 1 VIEW COMPARISON:  December 18, 2019 FINDINGS: An endotracheal tube is seen with its distal tip approximately 4.9 cm from the carina. A nasogastric  tube is noted with its distal end extending into the body of the stomach. Multiple sternal wires are present. Persistent bilateral upper lobe infiltrates are seen. There is no evidence of a pleural effusion or pneumothorax. The heart size and mediastinal contours are within normal limits. Radiopaque surgical clips are seen overlying the right upper quadrant. The visualized skeletal structures are unremarkable. IMPRESSION: 1. Endotracheal tube and nasogastric tube positioning, as described above. 2. Persistent bilateral upper lobe infiltrates. Electronically Signed   By: Virgina Norfolk M.D.   On: 2019-12-23 19:24   DG Chest Port 1 View  Result Date: 2019/12/23 CLINICAL DATA:  Weakness, slurred speech. EXAM: PORTABLE CHEST 1 VIEW COMPARISON:  May 20, 2017. FINDINGS: The heart size and mediastinal contours are within normal limits. Status post coronary bypass graft. No pneumothorax is noted.  Left lung is clear. Small right pleural effusion is noted. New right upper lobe airspace opacity is noted concerning for pneumonia. The visualized skeletal structures are unremarkable. IMPRESSION: New right upper lobe airspace opacity is noted concerning for pneumonia. Small right pleural effusion is noted. Followup PA and lateral chest X-ray is recommended in 3-4 weeks following trial of antibiotic therapy to ensure resolution and exclude underlying malignancy. Electronically Signed   By: Marijo Conception M.D.   On: December 23, 2019 12:38   US Abdomen Limited RUQ  Result Date: 12/23/19 CLINICAL DATA:  Elevated LFTs EXAM: ULTRASOUND ABDOMEN LIMITED RIGHT UPPER QUADRANT COMPARISON:  CT dated March 30, 2017. FINDINGS: Gallbladder: Surgically absent Common bile duct: Diameter: 4 mm Liver: The liver parenchyma is coarsened and heterogeneous. There is no discrete hepatic mass. Portal vein is patent on color Doppler imaging with normal direction of blood flow towards the liver. Other: There is a right-sided pleural effusion. IMPRESSION: 1. Status post cholecystectomy. 2. Coarsened and heterogeneous appearance of the liver parenchyma suggestive of underlying hepatocellular disease. 3. Right-sided pleural effusion incidentally noted. Electronically Signed   By: Constance Holster M.D.   On: 2019-12-23 18:34     STUDIES:  9/21: CXR>>New right upper lobe airspace opacity is noted concerning for pneumonia. Small right pleural effusion is noted. Followup PA and lateral chest X-ray is recommended in 3-4 weeks following trial of antibiotic therapy to ensure resolution and exclude underlying Malignancy. 9/21: CT Head>>Age related volume loss with mild patchy periventricular small vessel disease. No acute infarct. No mass or hemorrhage. Foci of arterial vascular calcification noted. Mucosal thickening noted in several paranasal sinuses as well as opacification in a few inferior mastoid air cells on the right. 9/21:  Renal US>>1. No hydronephrosis. 2. Echogenic kidneys bilaterally which can be seen in patients with medical renal disease. 9/21: Abdominal US>>1. Status post cholecystectomy. 2. Coarsened and heterogeneous appearance of the liver parenchyma suggestive of underlying hepatocellular disease. 3. Right-sided pleural effusion incidentally noted.  CULTURES: SARS-CoV-2 PCR 9/21>> negative Blood culture x2 9/21>> Urine 9/21>> Strep pneumo urinary antigen 9/21>> Legionella urinary antigen 9/21>> Tracheal aspirate 9/21>>  ANTIBIOTICS: Azithromycin 9/21>> 9/21 Ceftriaxone 9/21>> 9/21 Cefepime 9/21>> Vancomycin 9/21  SIGNIFICANT EVENTS: 9/21: Presented to ED, to be admitted due to sepsis and pneumonia 9/21: Suffered cardiac arrest while in the ED 9/21: PCCM consulted, patient with multiorgan failure and signs of anoxic brain injury, transition to comfort care per wife's request  LINES/TUBES: Endotracheal tube 9/21>>   ASSESSMENT / PLAN:  PULMONARY A: Acute hypoxic respiratory failure secondary to cardiac arrest and community acquired pneumonia P:   -Full vent support -Wean FiO2 as tolerated to maintain O2 sats greater than  92% -Follow intermittent ABG and chest x-ray as needed -VAP protocol -Placed on cefepime and vancomycin -As needed bronchodilators  CARDIOVASCULAR A:  PEA cardiac arrest Bradycardia Shock (septic +/- cardiogenic) P:  -Continuous cardiac monitoring -Maintain MAP greater than 65 -Received IV fluid resuscitation -Vasopressors (Epi, Dopamine, Levophed) as needed to maintain MAP goal -Stress dose steroids -Cardiology consulted, appreciate input -Trend troponin until downtrending -Trend lactic acid  -Obtain 2D echocardiogram  RENAL A:   AKI Hyperkalemia Hyponatremia Severe anion gap metabolic acidosis P:   -Monitor I&O's / urinary output -Follow BMP -Ensure adequate renal perfusion -Avoid nephrotoxic agents as able -Replace electrolytes as  indicated -Nephrology consulted, appreciate input -Bicarb drip -Suspect will need CRRT   GASTROINTESTINAL A:   Transaminitis P:   -N.p.o. 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 Mortimer Fries, not a candidate for targeted temperature management -Consider neurology consult   FAMILY  - Updates: Updated patient's wife at bedside.  Discussed patient's critical status with multiorgan failure concerns for anoxic brain injury.  Patient's wife request that patient be made DNR/DNI with transition to comfort care measures and withdraw all life-sustaining measures.      Darel Hong, AGACNP-BC [facility] Pager: [phone]  Dec 14, 2019, 7:42 PM

## 2019-12-03 NOTE — H&P (Addendum)
History and Physical    Jason House:[id number] DOB: 07/06/1940 DOA: Dec 01, 2019  PCP: Jason Crouch, MD   Patient coming from: Home  I have personally briefly reviewed patient's old medical records in [place]  Chief Complaint: Weakness  HPI: Jason House is a 79 y.o. male with medical history significant for COPD, hypertension, hypothyroidism who presents to the ER for evaluation of progressively worsening weakness over the last 1 week.  Patient is unable to stand or ambulate and states that he needs a two-person assist to get out of bed.  He has had poor oral intake over the last couple of days but denies having any nausea, no vomiting, no diarrhea or abdominal pain. He has a cough productive of greenish phlegm as well as shortness of breath but denies having any fever or chills.  He denies having any chest pain. Patient was diagnosed with pneumonia about 6 days prior to his admission and was started on Levaquin as an outpatient without any improvement in his symptoms. He denies having any urinary symptoms, no dizziness, no lightheadedness, no headache.  He has had no falls or loss of consciousness. Labs show sodium of 117, potassium 5.2, chloride 87, bicarb 11, BUN 150, creatinine 6.38, calcium 7.9, anion gap of 19, AST 300, ALT 104, total protein 6.3, total bilirubin 1.5, lactic acid 1.4, white count 41,000 with a left shift, hemoglobin 11.4, hematocrit 31, MCV 79.5, RDW 14.7, platelet count 529 CT scan of the head without contrast shows age-related volume loss with mild patchy periventricular small vessel disease.  No acute infarct.  No mass or hemorrhage. Chest x-ray reviewed by me shows right upper lobe airspace opacity. Twelve-lead EKG reviewed by me shows marked sinus bradycardia.  ED Course: Patient is a 79 year old Caucasian male who was brought into the ER by EMS for evaluation of weakness and poor oral intake. Patient was diagnosed with a right upper lobe  pneumonia about 6 days prior to his admission and was started on Levaquin with no improvement in his symptoms. His labs today show worsening of his renal function from baseline, serum creatinine 1.67 >> 6.38 and serum BUN 24 >> 150. He was hypotensive, bradycardic and hypothermic upon arrival to the ER and is currently on a Retail banker.  Systolic blood pressure was in the 80s but improved with IV fluid resuscitation. Patient has marked leukocytosis with a left shift and imaging shows right upper lobe pneumonia.  He has a normal lactic acid level.  He will be admitted to the hospital for further evaluation  Review of Systems: As per HPI otherwise 10 point review of systems negative.    Past Medical History:  Diagnosis Date  . Anemia   . COPD (chronic obstructive pulmonary disease) ([place])   . Coronary artery disease   . Glaucoma   . H/O ETOH abuse   . History of shingles   . Hyperlipidemia   . Hypertension   . Hypothyroidism     Past Surgical History:  Procedure Laterality Date  . CHOLECYSTECTOMY N/A 01/18/2019   Procedure: LAPAROSCOPIC CHOLECYSTECTOMY WITH INTRAOPERATIVE CHOLANGIOGRAM;  Surgeon: Jason Ree, MD;  Location: [facility] ORS;  Service: General;  Laterality: N/A;  . COLONOSCOPY WITH PROPOFOL N/A 09/23/2014   Procedure: COLONOSCOPY WITH PROPOFOL;  Surgeon: Jason Class, MD;  Location: [facility] ENDOSCOPY;  Service: Endoscopy;  Laterality: N/A;  . CORONARY ANGIOPLASTY    . CORONARY ARTERY BYPASS GRAFT    . CYSTOSCOPY W/ RETROGRADES Bilateral 05/09/2019  Procedure: CYSTOSCOPY WITH RETROGRADE PYELOGRAM;  Surgeon: Jason Sons, MD;  Location: [facility] ORS;  Service: Urology;  Laterality: Bilateral;  . CYSTOSCOPY WITH BIOPSY N/A 05/09/2019   Procedure: CYSTOSCOPY WITH BIOPSY;  Surgeon: Jason Sons, MD;  Location: [facility] ORS;  Service: Urology;  Laterality: N/A;  . ENDARTERECTOMY Right 02/12/2018   Procedure: ENDARTERECTOMY CAROTID;  Surgeon: Jason Huxley, MD;  Location: [facility] ORS;   Service: Vascular;  Laterality: Right;  . ERCP N/A 04/22/2017   Procedure: ENDOSCOPIC RETROGRADE CHOLANGIOPANCREATOGRAPHY (ERCP);  Surgeon: Jason Lame, MD;  Location: [facility] ENDOSCOPY;  Service: Endoscopy;  Laterality: N/A;  . EYE SURGERY Bilateral    cataract  . HEMATOMA EVACUATION N/A 05/09/2019   Procedure: EVACUATION HEMATOMA;  Surgeon: Jason Sons, MD;  Location: [facility] ORS;  Service: Urology;  Laterality: N/A;  . TRANSURETHRAL RESECTION OF BLADDER TUMOR N/A 05/09/2019   Procedure: TRANSURETHRAL RESECTION OF BLADDER TUMOR (TURBT);  Surgeon: Jason Sons, MD;  Location: [facility] ORS;  Service: Urology;  Laterality: N/A;     reports that he has quit smoking. His smoking use included cigarettes. He has never used smokeless tobacco. He reports that he does not drink alcohol and does not use drugs.  No Known Allergies  Family History  Problem Relation Age of Onset  . Diabetes Mother   . CAD Mother   . Cancer Sister   . Throat cancer Brother   . Cirrhosis Brother   . COPD Sister   . COPD Sister   . Diabetes Sister      Prior to Admission medications   Medication Sig Start Date End Date Taking? Authorizing Provider  Albuterol Sulfate (PROAIR RESPICLICK) 638 (90 Base) MCG/ACT AEPB Inhale 1 puff into the lungs every 4 (four) hours as needed (shortness of breath). 05/15/17   Jason Hong, MD  aspirin 81 MG EC tablet Take 81 mg by mouth daily.     [provider]  atorvastatin (LIPITOR) 80 MG tablet Take 80 mg by mouth every evening.     [provider]  BYSTOLIC 5 MG tablet Take 5 mg by mouth daily. 12/19/17   [provider]  clopidogrel (PLAVIX) 75 MG tablet Take 75 mg by mouth daily. 05/22/19   [provider]  cyclobenzaprine (FLEXERIL) 10 MG tablet Take 10 mg by mouth daily as needed for muscle spasms.     [provider]  dorzolamide-timolol (COSOPT) 22.3-6.8 MG/ML ophthalmic solution Place 1 drop into the left eye 2 (two) times daily.      [provider]  furosemide (LASIX) 20 MG tablet Take 20 mg by mouth daily as needed for fluid or edema.  11/10/19   [provider]  hydrochlorothiazide (HYDRODIURIL) 25 MG tablet Take 25 mg by mouth daily.  09/07/12   [provider]  latanoprost (XALATAN) 0.005 % ophthalmic solution Place 1 drop into the left eye at bedtime.     [provider]  levothyroxine (SYNTHROID, LEVOTHROID) 50 MCG tablet Take 50 mcg by mouth daily before breakfast.  01/04/17   [provider]  lisinopril (PRINIVIL,ZESTRIL) 10 MG tablet Take 10 mg by mouth daily. 12/20/17   [provider]  mupirocin ointment (BACTROBAN) 2 % Place 1 application into the nose 2 (two) times daily. Patient not taking: Reported on 11/15/2019 05/09/19   Loletha Grayer, MD  ranolazine (RANEXA) 500 MG 12 hr tablet Take 500 mg by mouth 2 (two) times daily.  12/30/17   [provider]  Donnal Debar 100-62.5-25 MCG/INH  AEPB Inhale 1 puff into the lungs daily. 10/03/19   [provider]  vitamin B-12 (CYANOCOBALAMIN) 1000 MCG tablet Take 1,000 mcg by mouth daily.    [provider]    Physical Exam: Vitals:   Dec 10, 2019 1445 12-10-2019 1500 12-10-2019 1515 2019/12/10 1530  BP:  (!) 90/39  (!) 83/67  Pulse: (!) 41 (!) 40 (!) 40 (!) 42  Resp: 20 (!) 22 (!) 22 (!) 26  Temp:      TempSrc:      SpO2: 100% 100% 100% 100%  Weight:      Height:         Vitals:   12-10-19 1445 12-10-19 1500 12-10-2019 1515 12/10/19 1530  BP:  (!) 90/39  (!) 83/67  Pulse: (!) 41 (!) 40 (!) 40 (!) 42  Resp: 20 (!) 22 (!) 22 (!) 26  Temp:      TempSrc:      SpO2: 100% 100% 100% 100%  Weight:      Height:        Constitutional: NAD, alert and oriented x 3.  Chronically ill-appearing, Bair hugger in place  Eyes: PERRL, lids and conjunctivae normal ENMT: Mucous membranes are dry.  Neck: normal, supple, no masses, no thyromegaly Respiratory: Rhonchi right upper lobe , no wheezing, no  crackles. Normal respiratory effort. No accessory muscle use.  Cardiovascular: Bradycardic, no murmurs / rubs / gallops. No extremity edema. 2+ pedal pulses. No carotid bruits.  Abdomen: no tenderness, no masses palpated. No hepatosplenomegaly. Bowel sounds positive.  Musculoskeletal: no clubbing / cyanosis. No joint deformity upper and lower extremities.  Skin: no rashes, lesions, ulcers.  Neurologic: No gross focal neurologic deficit.  Generalized weakness Psychiatric: Normal mood and affect.   Labs on Admission: I have personally reviewed following labs and imaging studies  CBC: Recent Labs  Lab December 10, 2019 1219  WBC 41.1*  NEUTROABS 30.9*  HGB 11.4*  HCT 31.0*  MCV 79.5*  PLT 277*   Basic Metabolic Panel: Recent Labs  Lab 12/10/2019 1219  NA 117*  K 5.2*  CL 87*  CO2 11*  GLUCOSE 108*  BUN 150*  CREATININE 6.38*  CALCIUM 7.9*   GFR: Estimated Creatinine Clearance: 8.4 mL/min (A) (by C-G formula based on SCr of 6.38 mg/dL (H)). Liver Function Tests: Recent Labs  Lab 2019-12-10 1219  AST 300*  ALT 104*  ALKPHOS 55  BILITOT 1.5*  PROT 6.3*  ALBUMIN 2.4*   No results for input(s): LIPASE, AMYLASE in the last 168 hours. No results for input(s): AMMONIA in the last 168 hours. Coagulation Profile: Recent Labs  Lab 2019-12-10 1219  INR 1.4*   Cardiac Enzymes: No results for input(s): CKTOTAL, CKMB, CKMBINDEX, TROPONINI in the last 168 hours. BNP (last 3 results) No results for input(s): PROBNP in the last 8760 hours. HbA1C: No results for input(s): HGBA1C in the last 72 hours. CBG: No results for input(s): GLUCAP in the last 168 hours. Lipid Profile: No results for input(s): CHOL, HDL, LDLCALC, TRIG, CHOLHDL, LDLDIRECT in the last 72 hours. Thyroid Function Tests: No results for input(s): TSH, T4TOTAL, FREET4, T3FREE, THYROIDAB in the last 72 hours. Anemia Panel: No results for input(s): VITAMINB12, FOLATE, FERRITIN, TIBC, IRON, RETICCTPCT in the last 72  hours. Urine analysis:    Component Value Date/Time   COLORURINE YELLOW 07/19/2019 0851   APPEARANCEUR Clear 11/10/2019 0844   LABSPEC 1.015 07/19/2019 0851   PHURINE 5.5 07/19/2019 0851   GLUCOSEU Negative 11/10/2019 0844   HGBUR MODERATE (A)  07/19/2019 0851   BILIRUBINUR Negative 11/10/2019 0844   KETONESUR NEGATIVE 07/19/2019 0851   PROTEINUR 1+ (A) 11/10/2019 0844   PROTEINUR TRACE (A) 07/19/2019 0851   NITRITE Negative 11/10/2019 0844   NITRITE NEGATIVE 07/19/2019 0851   LEUKOCYTESUR 1+ (A) 11/10/2019 0844   LEUKOCYTESUR TRACE (A) 07/19/2019 0851    Radiological Exams on Admission: CT Head Wo Contrast  Result Date: 11-Dec-2019 CLINICAL DATA:  Altered mental status EXAM: CT HEAD WITHOUT CONTRAST TECHNIQUE: Contiguous axial images were obtained from the base of the skull through the vertex without intravenous contrast. COMPARISON:  March 25, 2017 FINDINGS: Brain: There is stable age related volume loss. There is no intracranial mass, hemorrhage, extra-axial fluid collection, or midline shift. There is mild patchy small vessel disease in the centra semiovale bilaterally. Elsewhere, brain parenchyma appears unremarkable. No acute infarct is appreciable. Vascular: There is no hyperdense vessel. There is calcification in each carotid siphon region. Skull: Bony calvarium appears intact. Sinuses/Orbits: There is mucosal thickening in the posterior right maxillary antrum. There is mucosal thickening and opacification in several ethmoid air cells. Orbits appear symmetric bilaterally. Other: Mastoid air cells on the left are clear. There is opacification in a few inferior mastoid air cells on the right. IMPRESSION: Age related volume loss with mild patchy periventricular small vessel disease. No acute infarct. No mass or hemorrhage. Foci of arterial vascular calcification noted. Mucosal thickening noted in several paranasal sinuses as well as opacification in a few inferior mastoid air cells on the  right. Electronically Signed   By: Lowella Grip III M.D.   On: 2019/12/11 13:08   DG Chest Port 1 View  Result Date: 12-11-19 CLINICAL DATA:  Weakness, slurred speech. EXAM: PORTABLE CHEST 1 VIEW COMPARISON:  May 20, 2017. FINDINGS: The heart size and mediastinal contours are within normal limits. Status post coronary bypass graft. No pneumothorax is noted. Left lung is clear. Small right pleural effusion is noted. New right upper lobe airspace opacity is noted concerning for pneumonia. The visualized skeletal structures are unremarkable. IMPRESSION: New right upper lobe airspace opacity is noted concerning for pneumonia. Small right pleural effusion is noted. Followup PA and lateral chest X-ray is recommended in 3-4 weeks following trial of antibiotic therapy to ensure resolution and exclude underlying malignancy. Electronically Signed   By: Marijo Conception M.D.   On: 2019/12/11 12:38    EKG: Independently reviewed.  Sinus bradycardia  Assessment/Plan Principal Problem:   Sepsis ([place]) Active Problems:   Hypothyroidism   CAP (community acquired pneumonia)   Hyponatremia   AKI (acute kidney injury) ([place])   CAD (coronary artery disease)   Sinus bradycardia     Sepsis ([place]) As evidenced by hypothermia, hypotension with systolic blood pressure less than 7mmHg, tachypnea, leukocytosis with a white count of 41,000, evidence of acute organ failure (AKI) and imaging suggestive of right upper lobe pneumonia Patient received sepsis fluid requirement in the ER Continue aggressive IV fluid resuscitation Empiric antibiotic therapy with Rocephin and Zithromax Follow-up results of blood cultures    Community-acquired pneumonia With failure of outpatient antibiotic therapy Patient had received Levaquin as an outpatient with no improvement in his symptoms We will treat empirically with Rocephin and Zithromax We will request speech therapy consult for swallow function  evaluation   Acute kidney injury At baseline patient has stage III chronic kidney disease with a serum creatinine of 1.67. Today on admission his serum creatinine is 6.38 and BUN is elevated at 150 compared to baseline of 24  Acute kidney injury appears to be secondary to ATN from hypotension Hold all diuretics and lisinopril Continue IV fluid hydration Obtain abdominal ultrasound to rule out obstructive uropathy Nephrology consult    Sinus bradycardia Patient is awake and alert but is very weak We will hold diastolic and Cosopt eyedrops Obtain TSH levels We will request cardiology consult   Hyponatremia Multifactorial and is secondary to diuretic therapy (patient was on hydrochlorothiazide) and concomitant poor oral intake Continue IV fluid hydration Repeat sodium levels in a.m.   History of coronary artery disease Status post CABG Continue aspirin and Ranexa Beta-blocker is on hold due to bradycardia Atorvastatin is on hold due to transaminitis   Hypothyroidism Continue Synthroid    Transaminitis Unclear etiology Hold statin for now Obtain right upper quadrant ultrasound  DVT prophylaxis: Heparin Code Status: Full code Family Communication: Greater than 50% of time was spent discussing plan of care with patient at the bedside.  He verbalizes understanding and agrees with the plan.  All questions and concerns have been addressed. Disposition Plan: Back to previous home environment Consults called: Nephrology/cardiology    Cassandr Cederberg MD [facility]     11-29-2019, 4:06 PM

## 2019-12-03 NOTE — Death Summary Note (Signed)
DEATH SUMMARY   Patient Details  Name: Jason House MRN: [id number] DOB: September 07, 1940  Admission/Discharge Information   Admit Date:  12-09-19  Date of Death:  12/09/2019  Time of Death:  22:47  Length of Stay: 0  Referring Physician: Idelle Crouch, MD   Reason(s) for Hospitalization  Severe Sepsis Community Acquired Pneumonia AKI Hyperkalemia Hyponatremia Sinus Bradycardia Cardiac arrest  Diagnoses  Preliminary cause of death:   Cardiac Arrest Secondary Diagnoses (including complications and co-morbidities):  Principal Problem:   Sepsis ([place]) Active Problems:   Hypothyroidism   CAP (community acquired pneumonia)   Hyponatremia   AKI (acute kidney injury) ([place])   CAD (coronary artery disease)   Sinus bradycardia   Brief Hospital Course (including significant findings, care, treatment, and services provided and events leading to death)  Jason House is a 79 year old male with a past medical history significant for COPD, hypertension, hypothyroidism who presented to [facility] ED on 09-Dec-2019 due to progressive weakness over the past 1 week.  Patient was unable to stand or ambulate and stated he needed 2 person assist to get out of bed.  He also had poor p.o. intake over the last couple days.  He endorsed a productive cough of greenish phlegm, as well as shortness of breath.  He denied chest pain, fever, chills, nausea, vomiting, diarrhea, abdominal pain.  Of note he was diagnosed with pneumonia about 6 days prior to admission and was receiving outpatient Levaquin without any noted improvement in his symptoms.  Upon presentation to the ED he was noted to be hypotensive, bradycardic, and hypothermic.  Initial systolic blood pressure in the 80s but improved with IV fluid resuscitation.  Initial work-up in the ED revealed sodium 117, potassium 5.2, chloride 87, bicarb 11, BUN 150, creatinine 6.38, calcium 7.9, anion gap 19, AST 300, ALT 104, total protein 6.3, total bilirubin  1.5, lactic acid 1.4, WBC 41 with a left shift, hemoglobin 11.4, hematocrit 31, platelets 529.  Chest x-ray showed right upper lobe airspace opacity.  CT scan of the head negative for any acute intracranial processes.  EKG with marked sinus bradycardia.  He was be to be admitted by the hospitalist for further work-up and treatment of severe sepsis secondary to right upper lobe pneumonia, along with acute kidney injury, sinus bradycardia, and transaminitis.  Nephrology and cardiology were consulted.  While awaiting bed placement in the ED he suffered a brief PEA cardiac arrest.  He required approximately 5 minutes of ACLS, and was intubated by the ED provider with return of ROSC.  Post cardiac arrest he was unresponsive with fixed and dilated pupils, was severely hypoxic and hypotensive requiring multiple vasopressors.  Postarrest EKG without obvious signs of ischemia.  Suspect cardiac arrest occurred in the setting of severe sepsis and severe metabolic acidosis.  PCCM was consulted for further work-up and management of cardiac arrest, severe sepsis and metabolic acidosis, acute kidney injury, hyperkalemia, and transaminitis.  Upon arrival to ICU patient with multiorgan failure, severe hypoxia despite full vent support, and severe hypotension despite 3 vasopressors and stress dose steroids.  Patient also with signs of anoxic injury as patient unresponsive with fixed and dilated pupils, no cough/gag/corneal reflexes.  Patient is actively dying and appears to be suffering.  Patient's wife was brought to bedside and updated.  She reported patient would have never wanted this and would not want to live this way, therefore she elected to transition to comfort care measures and discontinue all life-sustaining measures.    Pertinent  Labs and Studies  Significant Diagnostic Studies CT Head Wo Contrast  Result Date: 12/11/2019 CLINICAL DATA:  Altered mental status EXAM: CT HEAD WITHOUT CONTRAST TECHNIQUE:  Contiguous axial images were obtained from the base of the skull through the vertex without intravenous contrast. COMPARISON:  March 25, 2017 FINDINGS: Brain: There is stable age related volume loss. There is no intracranial mass, hemorrhage, extra-axial fluid collection, or midline shift. There is mild patchy small vessel disease in the centra semiovale bilaterally. Elsewhere, brain parenchyma appears unremarkable. No acute infarct is appreciable. Vascular: There is no hyperdense vessel. There is calcification in each carotid siphon region. Skull: Bony calvarium appears intact. Sinuses/Orbits: There is mucosal thickening in the posterior right maxillary antrum. There is mucosal thickening and opacification in several ethmoid air cells. Orbits appear symmetric bilaterally. Other: Mastoid air cells on the left are clear. There is opacification in a few inferior mastoid air cells on the right. IMPRESSION: Age related volume loss with mild patchy periventricular small vessel disease. No acute infarct. No mass or hemorrhage. Foci of arterial vascular calcification noted. Mucosal thickening noted in several paranasal sinuses as well as opacification in a few inferior mastoid air cells on the right. Electronically Signed   By: Lowella Grip III M.D.   On: December 11, 2019 13:08   US RENAL  Result Date: 12-11-19 CLINICAL DATA:  History of bladder cancer.  Acute renal failure. EXAM: RENAL / URINARY TRACT ULTRASOUND COMPLETE COMPARISON:  10/05/2019 FINDINGS: Right Kidney: Renal measurements: 9 x 5 x 4.7 cm = volume: 112 mL. There is no hydronephrosis. There is increased cortical echogenicity. Left Kidney: Renal measurements: 9.1 x 5.5 x 4.1 cm = volume: 105 mL. There is increased cortical echogenicity without evidence for hydronephrosis. Bladder: The urinary bladder is decompressed by Foley catheter. Other: None. IMPRESSION: 1. No hydronephrosis. 2. Echogenic kidneys bilaterally which can be seen in patients with  medical renal disease. Electronically Signed   By: Constance Holster M.D.   On: 12/11/2019 18:35   DG Chest Portable 1 View  Result Date: 12/11/2019 CLINICAL DATA:  Status post intubation and orogastric tube placement. EXAM: PORTABLE CHEST 1 VIEW COMPARISON:  12/11/2019 FINDINGS: An endotracheal tube is seen with its distal tip approximately 4.9 cm from the carina. A nasogastric tube is noted with its distal end extending into the body of the stomach. Multiple sternal wires are present. Persistent bilateral upper lobe infiltrates are seen. There is no evidence of a pleural effusion or pneumothorax. The heart size and mediastinal contours are within normal limits. Radiopaque surgical clips are seen overlying the right upper quadrant. The visualized skeletal structures are unremarkable. IMPRESSION: 1. Endotracheal tube and nasogastric tube positioning, as described above. 2. Persistent bilateral upper lobe infiltrates. Electronically Signed   By: Virgina Norfolk M.D.   On: 12-11-2019 19:24   DG Chest Port 1 View  Result Date: 2019-12-11 CLINICAL DATA:  Weakness, slurred speech. EXAM: PORTABLE CHEST 1 VIEW COMPARISON:  May 20, 2017. FINDINGS: The heart size and mediastinal contours are within normal limits. Status post coronary bypass graft. No pneumothorax is noted. Left lung is clear. Small right pleural effusion is noted. New right upper lobe airspace opacity is noted concerning for pneumonia. The visualized skeletal structures are unremarkable. IMPRESSION: New right upper lobe airspace opacity is noted concerning for pneumonia. Small right pleural effusion is noted. Followup PA and lateral chest X-ray is recommended in 3-4 weeks following trial of antibiotic therapy to ensure resolution and exclude underlying malignancy. Electronically Signed  By: Marijo Conception M.D.   On: 2019-12-08 12:38   US Abdomen Limited RUQ  Result Date: 08-Dec-2019 CLINICAL DATA:  Elevated LFTs EXAM: ULTRASOUND  ABDOMEN LIMITED RIGHT UPPER QUADRANT COMPARISON:  CT dated March 30, 2017. FINDINGS: Gallbladder: Surgically absent Common bile duct: Diameter: 4 mm Liver: The liver parenchyma is coarsened and heterogeneous. There is no discrete hepatic mass. Portal vein is patent on color Doppler imaging with normal direction of blood flow towards the liver. Other: There is a right-sided pleural effusion. IMPRESSION: 1. Status post cholecystectomy. 2. Coarsened and heterogeneous appearance of the liver parenchyma suggestive of underlying hepatocellular disease. 3. Right-sided pleural effusion incidentally noted. Electronically Signed   By: Constance Holster M.D.   On: 12/08/19 18:34    Microbiology Recent Results (from the past 240 hour(s))  SARS Coronavirus 2 by RT PCR (hospital order, performed in [facility] hospital lab) Nasopharyngeal Nasopharyngeal Swab     Status: None   Collection Time: 12-08-2019 12:49 PM   Specimen: Nasopharyngeal Swab  Result Value Ref Range Status   SARS Coronavirus 2 NEGATIVE NEGATIVE Final    Comment: (NOTE) SARS-CoV-2 target nucleic acids are NOT DETECTED.  The SARS-CoV-2 RNA is generally detectable in upper and lower respiratory specimens during the acute phase of infection. The lowest concentration of SARS-CoV-2 viral copies this assay can detect is 250 copies / mL. A negative result does not preclude SARS-CoV-2 infection and should not be used as the sole basis for treatment or other patient management decisions.  A negative result may occur with improper specimen collection / handling, submission of specimen other than nasopharyngeal swab, presence of viral mutation(s) within the areas targeted by this assay, and inadequate number of viral copies (<250 copies / mL). A negative result must be combined with clinical observations, patient history, and epidemiological information.  Fact Sheet for Patients:   StrictlyIdeas.no  Fact Sheet for  Healthcare Providers: BankingDealers.co.za  This test is not yet approved or  cleared by the [place] FDA and has been authorized for detection and/or diagnosis of SARS-CoV-2 by FDA under an Emergency Use Authorization (EUA).  This EUA will remain in effect (meaning this test can be used) for the duration of the COVID-19 declaration under Section 564(b)(1) of the Act, 21 U.S.C. section 360bbb-3(b)(1), unless the authorization is terminated or revoked sooner.  Performed at [facility], [address]elaine Bhat [place], [place] [address]     Lab Basic Metabolic Panel: Recent Labs  Lab 08-Dec-2019 1219 12/08/2019 1746 December 08, 2019 1939  NA 117* 122* 121*  K 5.2*  --  6.1*  CL 87*  --  99  CO2 11*  --  <7*  GLUCOSE 108*  --  99  BUN 150*  --  130*  CREATININE 6.38*  --  5.68*  CALCIUM 7.9*  --  6.7*   Liver Function Tests: Recent Labs  Lab 12/08/19 1219  AST 300*  ALT 104*  ALKPHOS 55  BILITOT 1.5*  PROT 6.3*  ALBUMIN 2.4*   No results for input(s): LIPASE, AMYLASE in the last 168 hours. No results for input(s): AMMONIA in the last 168 hours. CBC: Recent Labs  Lab 12/08/19 1219  WBC 41.1*  NEUTROABS 30.9*  HGB 11.4*  HCT 31.0*  MCV 79.5*  PLT 529*   Cardiac Enzymes: No results for input(s): CKTOTAL, CKMB, CKMBINDEX, TROPONINI in the last 168 hours. Sepsis Labs: Recent Labs  Lab 12/08/19 1219 Dec 08, 2019 1746 2019/12/08 1926  PROCALCITON  --  <0.10  --  WBC 41.1*  --   --   LATICACIDVEN 1.4  --  1.8    Procedures/Operations  Endotracheal Intubation December 19, 2019         Darel Hong, AGACNP-BC [place] [facility] Pager: [phone]  Bradly Bienenstock 12-19-2019, 11:12 PM

## 2019-12-03 NOTE — Progress Notes (Signed)
Pt arrived to ICU from ED, pt is critically ill with multiorgan failure (CV, pulmonary, renal, liver) and also unresponsive with fixed and dilated pupils despite being on no sedation.  Pt remains hypoxic despite 100% FiO2 and 8 PEEP, and remains hypotensive despite 3 vasopressors (Levo, Dopamine, and Epi). Pt is suffering and actively dying.    Pt's wife has arrived and we discussed his critical status and poor prognosis, along with high risk for recurrent cardiac arrest.  She states "he would never have wanted this and doesn't want to live like this."  She has elected to make pt a DNR/DNI, and would like to transition to [place].  Orders placed for morphine gtt and prn Ativan.  Once pt's daughter able to arrive at bedside, we will transition to comfort measures and withdraw all life sustaining measures.     Darel Hong, AGACNP-BC [place] [facility] Pager: [phone]

## 2019-12-03 NOTE — ED Notes (Signed)
Report called to beth rn ccu nurse

## 2019-12-03 DEATH — deceased

## 2019-12-06 ENCOUNTER — Other Ambulatory Visit: Payer: PPO

## 2019-12-10 ENCOUNTER — Other Ambulatory Visit: Payer: PPO

## 2019-12-14 ENCOUNTER — Ambulatory Visit: Admission: RE | Admit: 2019-12-14 | Payer: PPO | Source: Home / Self Care | Admitting: Urology

## 2019-12-14 SURGERY — TURBT (TRANSURETHRAL RESECTION OF BLADDER TUMOR)
Anesthesia: Choice

## 2020-05-23 ENCOUNTER — Ambulatory Visit (INDEPENDENT_AMBULATORY_CARE_PROVIDER_SITE_OTHER): Payer: PPO | Admitting: Vascular Surgery

## 2020-05-23 ENCOUNTER — Encounter (INDEPENDENT_AMBULATORY_CARE_PROVIDER_SITE_OTHER): Payer: PPO
